# Patient Record
Sex: Female | Born: 1955
Health system: Southern US, Community
[De-identification: ages and names within clinical notes are randomized; demographics above are authoritative.]

## PROBLEM LIST (undated history)

## (undated) DIAGNOSIS — Z86718 Personal history of other venous thrombosis and embolism: Secondary | ICD-10-CM

## (undated) DIAGNOSIS — J189 Pneumonia, unspecified organism: Secondary | ICD-10-CM

## (undated) DIAGNOSIS — E785 Hyperlipidemia, unspecified: Secondary | ICD-10-CM

## (undated) DIAGNOSIS — S92909A Unspecified fracture of unspecified foot, initial encounter for closed fracture: Secondary | ICD-10-CM

## (undated) DIAGNOSIS — J310 Chronic rhinitis: Secondary | ICD-10-CM

## (undated) DIAGNOSIS — I10 Essential (primary) hypertension: Secondary | ICD-10-CM

## (undated) DIAGNOSIS — G5603 Carpal tunnel syndrome, bilateral upper limbs: Secondary | ICD-10-CM

## (undated) HISTORY — DX: Personal history of other venous thrombosis and embolism: Z86.718

## (undated) HISTORY — DX: Hyperlipidemia, unspecified: E78.5

## (undated) HISTORY — DX: Chronic rhinitis: J31.0

## (undated) HISTORY — DX: Pneumonia, unspecified organism: J18.9

## (undated) HISTORY — DX: Carpal tunnel syndrome, bilateral upper limbs: G56.03

## (undated) HISTORY — DX: Unspecified fracture of unspecified foot, initial encounter for closed fracture: S92.909A

---
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
## 1989-12-17 HISTORY — PX: TOTAL ABDOMINAL HYSTERECTOMY: SHX209

## 2004-01-28 ENCOUNTER — Encounter: Admission: RE | Admit: 2004-01-28 | Discharge: 2004-01-28 | Payer: Self-pay | Admitting: Neurosurgery

## 2005-07-03 ENCOUNTER — Ambulatory Visit: Payer: Self-pay

## 2008-06-04 ENCOUNTER — Ambulatory Visit: Payer: Self-pay | Admitting: Family Medicine

## 2008-11-17 ENCOUNTER — Encounter: Admission: RE | Admit: 2008-11-17 | Discharge: 2008-11-17 | Payer: Self-pay | Admitting: Neurosurgery

## 2008-12-17 HISTORY — PX: BACK SURGERY: SHX140

## 2009-02-22 ENCOUNTER — Inpatient Hospital Stay (HOSPITAL_COMMUNITY): Admission: RE | Admit: 2009-02-22 | Discharge: 2009-02-27 | Payer: Self-pay | Admitting: Neurosurgery

## 2010-04-21 LAB — HM DIABETES EYE EXAM: HM Diabetic Eye Exam: NORMAL

## 2010-04-21 LAB — HM PAP SMEAR: HM Pap smear: NORMAL

## 2010-07-12 ENCOUNTER — Ambulatory Visit: Payer: Self-pay | Admitting: Internal Medicine

## 2011-01-07 ENCOUNTER — Encounter: Payer: Self-pay | Admitting: Neurosurgery

## 2011-03-13 ENCOUNTER — Ambulatory Visit: Payer: Self-pay | Admitting: Internal Medicine

## 2011-03-13 ENCOUNTER — Other Ambulatory Visit: Payer: Self-pay | Admitting: Neurosurgery

## 2011-03-13 DIAGNOSIS — M549 Dorsalgia, unspecified: Secondary | ICD-10-CM

## 2011-03-21 ENCOUNTER — Ambulatory Visit
Admission: RE | Admit: 2011-03-21 | Discharge: 2011-03-21 | Disposition: A | Discharge status: Home / Self Care | Payer: 59 | Source: Ambulatory Visit | Attending: Neurosurgery | Admitting: Neurosurgery

## 2011-03-21 DIAGNOSIS — M549 Dorsalgia, unspecified: Secondary | ICD-10-CM

## 2011-03-29 LAB — URINE CULTURE
Culture: NO GROWTH
Special Requests: NEGATIVE

## 2011-03-29 LAB — GLUCOSE, CAPILLARY
Glucose-Capillary: 114 mg/dL — ABNORMAL HIGH (ref 70–99)
Glucose-Capillary: 129 mg/dL — ABNORMAL HIGH (ref 70–99)
Glucose-Capillary: 133 mg/dL — ABNORMAL HIGH (ref 70–99)
Glucose-Capillary: 136 mg/dL — ABNORMAL HIGH (ref 70–99)
Glucose-Capillary: 137 mg/dL — ABNORMAL HIGH (ref 70–99)
Glucose-Capillary: 138 mg/dL — ABNORMAL HIGH (ref 70–99)
Glucose-Capillary: 139 mg/dL — ABNORMAL HIGH (ref 70–99)
Glucose-Capillary: 141 mg/dL — ABNORMAL HIGH (ref 70–99)
Glucose-Capillary: 145 mg/dL — ABNORMAL HIGH (ref 70–99)

## 2011-03-29 LAB — URINALYSIS, ROUTINE W REFLEX MICROSCOPIC
Bilirubin Urine: NEGATIVE
Glucose, UA: NEGATIVE mg/dL
Hgb urine dipstick: NEGATIVE
Ketones, ur: NEGATIVE mg/dL
Nitrite: NEGATIVE
Protein, ur: NEGATIVE mg/dL
Specific Gravity, Urine: 1.012 (ref 1.005–1.030)
Urobilinogen, UA: 0.2 mg/dL (ref 0.0–1.0)

## 2011-03-29 LAB — CBC
HCT: 31.4 % — ABNORMAL LOW (ref 36.0–46.0)
Hemoglobin: 13.8 g/dL (ref 12.0–15.0)
MCHC: 34.7 g/dL (ref 30.0–36.0)
Platelets: 202 10*3/uL (ref 150–400)
RDW: 13.9 % (ref 11.5–15.5)
RDW: 14 % (ref 11.5–15.5)

## 2011-03-29 LAB — TYPE AND SCREEN: Antibody Screen: NEGATIVE

## 2011-03-29 LAB — BASIC METABOLIC PANEL
CO2: 26 mEq/L (ref 19–32)
Calcium: 9.5 mg/dL (ref 8.4–10.5)
Glucose, Bld: 91 mg/dL (ref 70–99)
Sodium: 139 mEq/L (ref 135–145)

## 2011-05-01 NOTE — Op Note (Signed)
NAMEALARA, Olivia Benton            ACCOUNT NO.:  [id number]   MEDICAL RECORD NO.:  [id number]          PATIENT TYPE:  INP   LOCATION:  3004                         FACILITY:  [facility]   PHYSICIAN:  Payton Doughty, M.D.      DATE OF BIRTH:  1956-10-22   DATE OF PROCEDURE:  02/22/2009  DATE OF DISCHARGE:                               OPERATIVE REPORT   PREOPERATIVE DIAGNOSES:  Spondylolysis of L5 a grade 1 slip of L5 on S1  and spondylosis of L4-5.   POSTOPERATIVE DIAGNOSES:  Spondylolysis of L5 a grade 1 slip of L5 on S1  and spondylosis of L4-5.   OPERATIVE PROCEDURE:  L4-5 and L5-S1 laminectomy, diskectomy, posterior  lumbar interbody fusion, segmental pedicle screw fixation from L4-S1 and  posterolateral arthrodesis at L4-S1.   DICTATING DOCTOR:  Payton Doughty, MD   ANESTHESIA:  General endotracheal.   PREPARATION:  Prepped and draped with alcohol wipe.   COMPLICATIONS:  None.   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  She was  taken to the operative room, smoothly anesthetized and intubated, placed  prone on the operating table.  Following shave, prep, and drape in the  usual sterile fashion , the skin was incised from the top of L4 to the  bottom of S1.  The translaminar and transverse process of L4-L5 and  sacral ala were exposed bilaterally in subperiosteal plane.  Working  around the bottom and the [employer] rod fusion, the pars reticularis  lamina and inferior facet of L4-L5 on the left side and the lamina and  inferior facet of L5 on the right and the pars reticularis lamina and  inferior facet of L5 on the right were removed.  Diskectomy was carried  out.  Decompression of the L4, L5, and S1 roots was carried out to  traverse this area.  The endplates were prepared and PEEK cages were  placed 8 mm high  at both L4-5 and L5-S1.  They had been packed with bone  graft, harvested from the facet joints.  Pedicle screws were then placed  in L4, L5, and S1 using the standard landmarks under x-ray control.  One  screw on L5 had to be repositioned and it was done so without  difficulty.  They were connected by the rods.  The transverse process  and sacral ala were decorticated with high-speed drill and packed with  BMP on the extender matrix.  Final tightener was applied to the screws  to the screw caps.  Final x-ray showed good placement of interbody  grafts, pedicle screws,  and rods.  Wound was irrigated.  Hemostasis was assured.  Successive  layers of 0 Vicryl, 2-0 Vicryl, and 3-0 nylon were used to close.  Betadine and Telfa dressing was applied and made occlusive with OpSite  and the patient returned to the recovery room in good condition.  ______________________________  Payton Doughty, M.D.     MWR/MEDQ  D:  02/22/2009  T:  02/23/2009  Job:  [id number]

## 2011-05-01 NOTE — Discharge Summary (Signed)
Olivia Benton, Olivia Benton            ACCOUNT NO.:  [id number]   MEDICAL RECORD NO.:  [id number]          PATIENT TYPE:  INP   LOCATION:  3004                         FACILITY:  [facility]   PHYSICIAN:  Hilda Lias, M.D.   DATE OF BIRTH:  09-01-1956   DATE OF ADMISSION:  02/22/2009  DATE OF DISCHARGE:  02/27/2009                               DISCHARGE SUMMARY   ADMISSION DIAGNOSIS:  Lumbar spondylosis at L4-5 with spondylolisthesis  at L5-S1.   FINAL DIAGNOSIS:  Lumbar spondylosis at L4-5 with spondylolisthesis at  L5-S1.   CLINICAL HISTORY:  The patient was admitted because of back pain  radiating to both legs.  The patient previously had fusion from L4-T1.  Now she comes with changes at L4-5 and L5-S1.   LABORATORY:  Normal.   COURSE IN THE HOSPITAL:  The patient was taken to surgery and L4-5 and  L5-S1 fusion was done.  Today, she is walking.  She is ambulating.  She  has minimal discomfort.  She is being discharged to be followed by Dr.  Channing Mutters.   CONDITION ON DISCHARGE:  Improvement.   MEDICATIONS:  Percocet and diazepam.   DIET:  Regular.   ACTIVITY:  Not to drive and not to bend.   FOLLOW UP:  To be seen by Dr. Channing Mutters in 4 weeks.           ______________________________  Hilda Lias, M.D.     EB/MEDQ  D:  02/27/2009  T:  02/28/2009  Job:  [id number]

## 2011-05-01 NOTE — H&P (Signed)
Olivia Benton, Olivia Benton            ACCOUNT NO.:  [id number]   MEDICAL RECORD NO.:  [id number]          PATIENT TYPE:  INP   LOCATION:  3004                         FACILITY:  [facility]   PHYSICIAN:  Payton Doughty, M.D.      DATE OF BIRTH:  07/16/1956   DATE OF ADMISSION:  02/22/2009  DATE OF DISCHARGE:                              HISTORY & PHYSICAL   ADMISSION DIAGNOSES:  Spondylosis L4-5, spondylolysis of L5-S1, and  spondylolisthesis of L5 and S67.   A 55 year old right-handed white girl been followed for number of years.  She had a scoliosis operation at 55 years of age and was fused from L4-  T1, been doing well, but has had progressive increasing pain in her back  and into both legs.  CT demonstrates a severe spondylosis at L4-5 and  spondylolysis of L5 with grade 1 slip at 5 and 1 and she is now finally  admitted for a lumbar fusion at those levels.   MEDICAL HISTORY:  Remarkable for adult-onset diabetes and hypertension.   OPERATIONS:  As noted above.   SOCIAL HISTORY:  She smokes a pack of cigarettes a day and is not  working.   FAMILY HISTORY:  Not given.   REVIEW OF SYSTEMS:  Marked for back and leg pain.   PHYSICAL EXAMINATION:  HEENT:  Normal limits.  NECK:  She has good range of motion in her neck.  CHEST:  Clear.  CARDIAC:  Regular rate and rhythm.  NEUROLOGIC:  She is awake, alert, and oriented.  Cranial nerves are  intact.  Motor exam shows 5/5 strength throughout the upper extremities.  LOWER EXTREMITIES:  She has bilateral dorsiflexion weakness.  Sensory  dysesthesias described in L4 and L5 distribution.  Reflexes are absent  at the knees and ankles.   CT results have been reviewed above.   CLINICAL IMPRESSION:  Symptomatic spondylolysis and adjacent segment  disease at 4 and 5.  Spondylolisthesis at L5.   PLAN:  Lumbar laminectomy, diskectomy, posterior lumbar and body fusion.  We will try from one side and see what the decompression looks like.  She will  need pedicle fixation.  The risks and benefits approach have  been discussed with her.  She wished to proceed.   .           ______________________________  Payton Doughty, M.D.     MWR/MEDQ  D:  02/22/2009  T:  02/23/2009  Job:  [id number]

## 2011-07-18 ENCOUNTER — Ambulatory Visit: Payer: Self-pay | Admitting: Internal Medicine

## 2011-07-20 ENCOUNTER — Ambulatory Visit: Payer: Self-pay | Admitting: Internal Medicine

## 2011-07-20 LAB — HM MAMMOGRAPHY

## 2011-07-26 ENCOUNTER — Encounter: Payer: Self-pay | Admitting: Internal Medicine

## 2011-09-17 ENCOUNTER — Ambulatory Visit: Payer: Self-pay | Admitting: Surgery

## 2011-09-17 HISTORY — PX: BREAST BIOPSY: SHX20

## 2011-09-18 LAB — PATHOLOGY REPORT

## 2011-09-22 ENCOUNTER — Other Ambulatory Visit: Payer: Self-pay | Admitting: Internal Medicine

## 2011-10-18 ENCOUNTER — Ambulatory Visit (INDEPENDENT_AMBULATORY_CARE_PROVIDER_SITE_OTHER): Payer: 59 | Admitting: Internal Medicine

## 2011-10-18 ENCOUNTER — Encounter: Payer: Self-pay | Admitting: Internal Medicine

## 2011-10-18 DIAGNOSIS — E119 Type 2 diabetes mellitus without complications: Secondary | ICD-10-CM | POA: Insufficient documentation

## 2011-10-18 MED ORDER — METFORMIN HCL 500 MG PO TABS: 500.0000 mg | ORAL_TABLET | Freq: Every day | ORAL | Status: DC

## 2011-10-18 NOTE — Progress Notes (Signed)
Subjective:    Patient ID: Olivia Benton, female    DOB: Oct 17, 1956, 55 y.o.   MRN: [id number]  HPI 55 year old female with a history of diabetes presents for followup. She reports that she's been feeling well. She reports that blood sugars fasting are typically near 100. She denies any low blood sugars. She denies any blood sugars greater than 200. She continues trying to improve diet by limiting intake of high carbohydrate and high fat foods. She has not had a recent hemoglobin A1c. She denies any new wounds. She denies any other complaints today.  Outpatient Encounter Prescriptions as of 10/18/2011  Medication Sig Dispense Refill  . aspirin 81 MG tablet Take 81 mg by mouth daily.        Marland Kitchen doxycycline (VIBRAMYCIN) 100 MG capsule Take 100 mg by mouth daily.        . metFORMIN (GLUCOPHAGE) 500 MG tablet Take 1 tablet (500 mg total) by mouth daily.  90 tablet  4  . DISCONTD: metFORMIN (GLUCOPHAGE) 500 MG tablet Take 500 mg by mouth daily with breakfast.        . fluticasone (FLONASE) 50 MCG/ACT nasal spray Place 2 sprays into both nostrils Daily.      Marland Kitchen FREESTYLE LITE test strip 1 applicator Daily.      Marland Kitchen HYDROcodone-acetaminophen (VICODIN) 5-500 MG per tablet Take 1 tablet by mouth as needed.      Marland Kitchen losartan-hydrochlorothiazide (HYZAAR) 100-12.5 MG per tablet Take 1 tablet by mouth Daily.      Marland Kitchen omeprazole (PRILOSEC) 40 MG capsule Take 1 tablet by mouth Daily.      Marland Kitchen SINGULAIR 10 MG tablet TAKE 1 TABLET BY MOUTH AT BEDTIME  90 tablet  3    Review of Systems  Constitutional: Negative for fever, chills, appetite change, fatigue and unexpected weight change.  HENT: Negative for ear pain, congestion, sore throat, trouble swallowing, neck pain, voice change and sinus pressure.   Eyes: Negative for visual disturbance.  Respiratory: Negative for cough, shortness of breath, wheezing and stridor.   Cardiovascular: Negative for chest pain, palpitations and leg swelling.  Gastrointestinal:  Negative for nausea, vomiting, abdominal pain, diarrhea, constipation, blood in stool, abdominal distention and anal bleeding.  Genitourinary: Negative for dysuria and flank pain.  Musculoskeletal: Negative for myalgias, arthralgias and gait problem.  Skin: Negative for color change and rash.  Neurological: Negative for dizziness and headaches.  Hematological: Negative for adenopathy. Does not bruise/bleed easily.  Psychiatric/Behavioral: Negative for suicidal ideas, sleep disturbance and dysphoric mood. The patient is not nervous/anxious.    BP 113/75  Pulse 93  Temp(Src) 98.7 F (37.1 C) (Oral)  Resp 16  Ht 4\' 10"  (1.473 m)  Wt 159 lb (72.122 kg)  BMI 33.23 kg/m2  SpO2 97%     Objective:   Physical Exam  Constitutional: She is oriented to person, place, and time. She appears well-developed and well-nourished. No distress.  HENT:  Head: Normocephalic and atraumatic.  Right Ear: External ear normal.  Left Ear: External ear normal.  Nose: Nose normal.  Mouth/Throat: Oropharynx is clear and moist. No oropharyngeal exudate.  Eyes: Conjunctivae are normal. Pupils are equal, round, and reactive to light. Right eye exhibits no discharge. Left eye exhibits no discharge. No scleral icterus.  Neck: Normal range of motion. Neck supple. No tracheal deviation present. No thyromegaly present.  Cardiovascular: Normal rate, regular rhythm, normal heart sounds and intact distal pulses.  Exam reveals no gallop and no friction rub.   No murmur  heard. Pulmonary/Chest: Effort normal and breath sounds normal. No respiratory distress. She has no wheezes. She has no rales. She exhibits no tenderness.  Abdominal: Soft. Bowel sounds are normal. She exhibits no distension and no mass. There is no tenderness. There is no rebound and no guarding.  Musculoskeletal: Normal range of motion. She exhibits no edema and no tenderness.  Lymphadenopathy:    She has no cervical adenopathy.  Neurological: She is alert  and oriented to person, place, and time. No cranial nerve deficit. She exhibits normal muscle tone. Coordination normal.  Skin: Skin is warm and dry. No rash noted. She is not diaphoretic. No erythema. No pallor.  Psychiatric: She has a normal mood and affect. Her behavior is normal. Judgment and thought content normal.          Assessment & Plan:  1. Diabetes Mellitus -patient reports good control of diabetes mellitus. Will check hemoglobin A1c with labs. We'll plan to continue metformin. She will followup in 3 months.  2. Breast biopsy - patient had a recent breast biopsy after an abnormal mammogram. She notes that the biopsy was normal. We will request records on this.

## 2011-10-26 ENCOUNTER — Telehealth: Payer: Self-pay | Admitting: Internal Medicine

## 2011-10-26 NOTE — Telephone Encounter (Signed)
Labs faxed from [facility] were normal including blood counts, kidney function, liver function, cholesterol. A1c was. 5.9

## 2011-10-26 NOTE — Telephone Encounter (Signed)
Left detailed VM on pt's cell

## 2011-11-07 ENCOUNTER — Encounter: Payer: Self-pay | Admitting: Internal Medicine

## 2011-11-23 ENCOUNTER — Encounter: Payer: Self-pay | Admitting: Internal Medicine

## 2011-12-19 ENCOUNTER — Ambulatory Visit (INDEPENDENT_AMBULATORY_CARE_PROVIDER_SITE_OTHER): Payer: 59 | Admitting: Internal Medicine

## 2011-12-19 ENCOUNTER — Encounter: Payer: Self-pay | Admitting: Internal Medicine

## 2011-12-19 VITALS — BP 104/72 | HR 72 | Temp 98.6°F | Wt 154.0 lb

## 2011-12-19 DIAGNOSIS — J45901 Unspecified asthma with (acute) exacerbation: Secondary | ICD-10-CM

## 2011-12-19 MED ORDER — FLUTICASONE PROPIONATE HFA 220 MCG/ACT IN AERO: 1.0000 | INHALATION_SPRAY | Freq: Two times a day (BID) | RESPIRATORY_TRACT | Status: DC

## 2011-12-19 MED ORDER — HYDROCODONE-ACETAMINOPHEN 5-500 MG PO TABS: 1.0000 | ORAL_TABLET | Freq: Four times a day (QID) | ORAL | Status: DC | PRN

## 2011-12-19 MED ORDER — AMOXICILLIN-POT CLAVULANATE 875-125 MG PO TABS: 1.0000 | ORAL_TABLET | Freq: Two times a day (BID) | ORAL | Status: AC

## 2011-12-19 MED ORDER — ALBUTEROL SULFATE HFA 108 (90 BASE) MCG/ACT IN AERS: 2.0000 | INHALATION_SPRAY | Freq: Four times a day (QID) | RESPIRATORY_TRACT | Status: DC | PRN

## 2011-12-19 NOTE — Patient Instructions (Signed)
I am prescribing augmentin (antibiotic twice daily) and a steroid inhaler (use twice daily)  and a bronchodilator(albuuterol 1 pufff every 6 hours as needed ) to manage your wheezing without resorting to prednisone.   continue the mucinex with plenty of water,  Gargle with salt water for the sore throat AFTER you use the steroid  Inhaler twice daily ( to prevent thrush and manage your sore throat)

## 2011-12-19 NOTE — Progress Notes (Signed)
Subjective:    Patient ID: Olivia Benton, female    DOB: October 09, 1956, 56 y.o.   MRN: [id number]  HPI s. Zanetti is a 56 yr old white female with a history of asthma, no recent exacerbations, who presents with sore throat,  cough, sinus drainage, and wheezing.  She denies fevers.  Symptomsotms have been present for 4 days.  She has been using mucinex d and zyrtec d  with minimal improvement in symptoms.  Cough is worse at night and is disrupting  her sleep.   She also reports chest tightness. .  No myalgias, did have the influenza vaccine.   Past Medical History  Diagnosis Date  . Asthma     extrinsic  . Diabetes mellitus     non-insulin dependent  . Foot fracture   . Rhinitis   . Hyperlipidemia    Current Outpatient Prescriptions on File Prior to Visit  Medication Sig Dispense Refill  . aspirin 81 MG tablet Take 81 mg by mouth daily.        Marland Kitchen doxycycline (VIBRAMYCIN) 100 MG capsule Take 100 mg by mouth daily.        . fluticasone (FLONASE) 50 MCG/ACT nasal spray Place 2 sprays into both nostrils Daily.      Marland Kitchen FREESTYLE LITE test strip 1 applicator Daily.      Marland Kitchen losartan-hydrochlorothiazide (HYZAAR) 100-12.5 MG per tablet Take 1 tablet by mouth Daily.      . metFORMIN (GLUCOPHAGE) 500 MG tablet Take 1 tablet (500 mg total) by mouth daily.  90 tablet  4  . omeprazole (PRILOSEC) 40 MG capsule Take 1 tablet by mouth Daily.      Marland Kitchen SINGULAIR 10 MG tablet TAKE 1 TABLET BY MOUTH AT BEDTIME  90 tablet  3    Review of Systems  Constitutional: Negative for fever, chills and unexpected weight change.  HENT: Positive for congestion, sore throat, rhinorrhea, sneezing and postnasal drip. Negative for hearing loss, ear pain, nosebleeds, facial swelling, mouth sores, trouble swallowing, neck pain, neck stiffness, voice change, sinus pressure, tinnitus and ear discharge.   Eyes: Negative for pain, discharge, redness and visual disturbance.  Respiratory: Positive for cough, shortness of breath and  wheezing. Negative for chest tightness and stridor.   Cardiovascular: Negative for chest pain, palpitations and leg swelling.  Musculoskeletal: Negative for myalgias and arthralgias.  Skin: Negative for color change and rash.  Neurological: Negative for dizziness, weakness, light-headedness and headaches.  Hematological: Negative for adenopathy.       Objective:   Physical Exam  Constitutional: She is oriented to person, place, and time. She appears well-developed and well-nourished.  HENT:  Mouth/Throat: Oropharynx is clear and moist.  Eyes: EOM are normal. Pupils are equal, round, and reactive to light. No scleral icterus.  Neck: Normal range of motion. Neck supple. No JVD present. No thyromegaly present.  Cardiovascular: Normal rate, regular rhythm, normal heart sounds and intact distal pulses.   Pulmonary/Chest: Effort normal. She has wheezes.  Abdominal: Soft. Bowel sounds are normal. She exhibits no mass. There is no tenderness.  Musculoskeletal: Normal range of motion. She exhibits no edema.  Lymphadenopathy:    She has no cervical adenopathy.  Neurological: She is alert and oriented to person, place, and time.  Skin: Skin is warm and dry.  Psychiatric: She has a normal mood and affect.          Assessment & Plan:  Asthma exacerbation: likely secondary to viral URI but given underlying lung diease and  DM, will treat with augmentin, steroid inhaler and bronchodilator.  40 mg Depomedrol given;  She has defferd steroid taper.

## 2011-12-21 MED ORDER — METHYLPREDNISOLONE ACETATE 40 MG/ML IJ SUSP
40.0000 mg | Freq: Once | INTRAMUSCULAR | Status: AC
  Administered 2011-12-21: 40 mg via INTRAMUSCULAR

## 2011-12-21 NOTE — Progress Notes (Signed)
Addended by: Jobie Quaker on: 12/21/2011 06:10 PM   Modules accepted: Orders

## 2012-01-10 ENCOUNTER — Other Ambulatory Visit: Payer: Self-pay | Admitting: Internal Medicine

## 2012-01-10 MED ORDER — LOSARTAN POTASSIUM-HCTZ 100-12.5 MG PO TABS: 1.0000 | ORAL_TABLET | Freq: Every day | ORAL | Status: DC

## 2012-01-18 ENCOUNTER — Ambulatory Visit: Payer: 59 | Admitting: Internal Medicine

## 2012-01-21 ENCOUNTER — Encounter: Payer: Self-pay | Admitting: Internal Medicine

## 2012-01-21 ENCOUNTER — Ambulatory Visit (INDEPENDENT_AMBULATORY_CARE_PROVIDER_SITE_OTHER): Payer: 59 | Admitting: Internal Medicine

## 2012-01-21 VITALS — BP 112/72 | HR 118 | Temp 98.3°F | Ht <= 58 in | Wt 151.0 lb

## 2012-01-21 DIAGNOSIS — I10 Essential (primary) hypertension: Secondary | ICD-10-CM

## 2012-01-21 DIAGNOSIS — K219 Gastro-esophageal reflux disease without esophagitis: Secondary | ICD-10-CM | POA: Insufficient documentation

## 2012-01-21 DIAGNOSIS — E119 Type 2 diabetes mellitus without complications: Secondary | ICD-10-CM

## 2012-01-21 DIAGNOSIS — J309 Allergic rhinitis, unspecified: Secondary | ICD-10-CM | POA: Insufficient documentation

## 2012-01-21 DIAGNOSIS — E1169 Type 2 diabetes mellitus with other specified complication: Secondary | ICD-10-CM | POA: Insufficient documentation

## 2012-01-21 MED ORDER — MONTELUKAST SODIUM 10 MG PO TABS: 10.0000 mg | ORAL_TABLET | Freq: Every day | ORAL | Status: DC

## 2012-01-21 MED ORDER — GLUCOSE BLOOD VI STRP: ORAL_STRIP | Status: DC

## 2012-01-21 MED ORDER — FLUTICASONE PROPIONATE 50 MCG/ACT NA SUSP: 2.0000 | Freq: Every day | NASAL | Status: DC

## 2012-01-21 MED ORDER — LOSARTAN POTASSIUM-HCTZ 100-12.5 MG PO TABS: 1.0000 | ORAL_TABLET | Freq: Every day | ORAL | Status: DC

## 2012-01-21 MED ORDER — FREESTYLE LANCETS MISC: Status: AC

## 2012-01-21 MED ORDER — OMEPRAZOLE 40 MG PO CPDR: 40.0000 mg | DELAYED_RELEASE_CAPSULE | Freq: Every day | ORAL | Status: DC

## 2012-01-21 NOTE — Assessment & Plan Note (Signed)
Is well controlled with omeprazole. Will continue. Followup in 3 months.

## 2012-01-21 NOTE — Progress Notes (Signed)
Subjective:    Patient ID: Olivia Benton, female    DOB: 18-Mar-1956, 56 y.o.   MRN: [id number]  HPI 56 year old female with history of diabetes, hypertension, GERD, allergic rhinitis presents for followup. She reports she is doing very well. She continues to follow healthy diet and has lost nearly 8 pounds since November 2012. She reports her blood sugars are well controlled and typically fasting sugars are near 100. She reports full compliance with her metformin. She denies any noted side effects. In regards to her hypertension, she notes blood pressure is been well controlled. She denies any headache, chest pain, palpitations. In regards to her GERD, she notes that symptoms are well controlled with the use of omeprazole. In regards to her allergic rhinitis, she notes the symptoms are well-controlled Flonase.  Outpatient Encounter Prescriptions as of 01/21/2012  Medication Sig Dispense Refill  . albuterol (PROAIR HFA) 108 (90 BASE) MCG/ACT inhaler Inhale 2 puffs into the lungs every 6 (six) hours as needed for wheezing.  3.7 g  6  . aspirin 81 MG tablet Take 81 mg by mouth daily.        Marland Kitchen doxycycline (VIBRAMYCIN) 100 MG capsule Take 100 mg by mouth daily.        . fluticasone (FLONASE) 50 MCG/ACT nasal spray Place 2 sprays into the nose daily.  16 g  3  . glucose blood (FREESTYLE LITE) test strip Use as directed  300 each  3  . HYDROcodone-acetaminophen (VICODIN) 5-500 MG per tablet Take 1 tablet by mouth every 6 (six) hours as needed (cough).  90 tablet  0  . losartan-hydrochlorothiazide (HYZAAR) 100-12.5 MG per tablet Take 1 tablet by mouth daily.  90 tablet  3  . metFORMIN (GLUCOPHAGE) 500 MG tablet Take 1 tablet (500 mg total) by mouth daily.  90 tablet  4  . montelukast (SINGULAIR) 10 MG tablet Take 1 tablet (10 mg total) by mouth at bedtime.  90 tablet  3  . omeprazole (PRILOSEC) 40 MG capsule Take 1 capsule (40 mg total) by mouth daily.  90 capsule  3  . DISCONTD: fluticasone (FLONASE)  50 MCG/ACT nasal spray Place 2 sprays into both nostrils Daily.      Marland Kitchen DISCONTD: FREESTYLE LITE test strip 1 applicator Daily.      Marland Kitchen DISCONTD: losartan-hydrochlorothiazide (HYZAAR) 100-12.5 MG per tablet Take 1 tablet by mouth daily.  90 tablet  3  . DISCONTD: omeprazole (PRILOSEC) 40 MG capsule Take 1 tablet by mouth Daily.      Marland Kitchen DISCONTD: SINGULAIR 10 MG tablet TAKE 1 TABLET BY MOUTH AT BEDTIME  90 tablet  3  . fluticasone (FLOVENT HFA) 220 MCG/ACT inhaler Inhale 1 puff into the lungs 2 (two) times daily.  1 Inhaler  12  . Lancets (FREESTYLE) lancets Use as instructed  300 each  3    Review of Systems  Constitutional: Negative for fever, chills, appetite change, fatigue and unexpected weight change.  HENT: Negative for ear pain, congestion, sore throat, trouble swallowing, neck pain, voice change and sinus pressure.   Eyes: Negative for visual disturbance.  Respiratory: Negative for cough, shortness of breath, wheezing and stridor.   Cardiovascular: Negative for chest pain, palpitations and leg swelling.  Gastrointestinal: Negative for nausea, vomiting, abdominal pain, diarrhea, constipation, blood in stool, abdominal distention and anal bleeding.  Genitourinary: Negative for dysuria and flank pain.  Musculoskeletal: Negative for myalgias, arthralgias and gait problem.  Skin: Negative for color change and rash.  Neurological: Negative for dizziness  and headaches.  Hematological: Negative for adenopathy. Does not bruise/bleed easily.  Psychiatric/Behavioral: Negative for suicidal ideas, sleep disturbance and dysphoric mood. The patient is not nervous/anxious.    BP 112/72  Pulse 118  Temp(Src) 98.3 F (36.8 C) (Oral)  Ht 4\' 10"  (1.473 m)  Wt 151 lb (68.493 kg)  BMI 31.56 kg/m2  SpO2 98%     Objective:   Physical Exam  Constitutional: She is oriented to person, place, and time. She appears well-developed and well-nourished. No distress.  HENT:  Head: Normocephalic and  atraumatic.  Right Ear: External ear normal.  Left Ear: External ear normal.  Nose: Nose normal.  Mouth/Throat: Oropharynx is clear and moist. No oropharyngeal exudate.  Eyes: Conjunctivae are normal. Pupils are equal, round, and reactive to light. Right eye exhibits no discharge. Left eye exhibits no discharge. No scleral icterus.  Neck: Normal range of motion. Neck supple. No tracheal deviation present. No thyromegaly present.  Cardiovascular: Normal rate, regular rhythm, normal heart sounds and intact distal pulses.  Exam reveals no gallop and no friction rub.   No murmur heard. Pulmonary/Chest: Effort normal and breath sounds normal. No respiratory distress. She has no wheezes. She has no rales. She exhibits no tenderness.  Musculoskeletal: Normal range of motion. She exhibits no edema and no tenderness.  Lymphadenopathy:    She has no cervical adenopathy.  Neurological: She is alert and oriented to person, place, and time. No cranial nerve deficit. She exhibits normal muscle tone. Coordination normal.  Skin: Skin is warm and dry. No rash noted. She is not diaphoretic. No erythema. No pallor.  Psychiatric: She has a normal mood and affect. Her behavior is normal. Judgment and thought content normal.          Assessment & Plan:

## 2012-01-21 NOTE — Assessment & Plan Note (Signed)
Symptoms well controlled with Flonase. Will continue. 

## 2012-01-21 NOTE — Assessment & Plan Note (Signed)
Blood sugars currently well-controlled per patient. Will check hemoglobin A1c with labs. Continue metformin. Followup in 3 months.

## 2012-01-21 NOTE — Assessment & Plan Note (Signed)
Blood pressure well-controlled on losartan. We'll check renal function with labs. Followup in 3 months.

## 2012-01-22 ENCOUNTER — Encounter: Payer: Self-pay | Admitting: Internal Medicine

## 2012-02-07 ENCOUNTER — Ambulatory Visit: Payer: Self-pay | Admitting: Surgery

## 2012-02-11 ENCOUNTER — Telehealth: Payer: Self-pay | Admitting: Internal Medicine

## 2012-02-11 NOTE — Telephone Encounter (Signed)
Labs look good. A1c was 6%. Cholesterol was stable. LDL 93, just above goal of <70.  Triglycerides slightly high at 228.

## 2012-02-12 NOTE — Telephone Encounter (Signed)
Left mess to call office back.   

## 2012-02-14 ENCOUNTER — Encounter: Payer: Self-pay | Admitting: Internal Medicine

## 2012-02-14 NOTE — Telephone Encounter (Signed)
Patient notified

## 2012-04-21 ENCOUNTER — Encounter: Payer: Self-pay | Admitting: Internal Medicine

## 2012-04-21 ENCOUNTER — Ambulatory Visit (INDEPENDENT_AMBULATORY_CARE_PROVIDER_SITE_OTHER): Payer: 59 | Admitting: Internal Medicine

## 2012-04-21 VITALS — BP 113/75 | HR 92 | Temp 98.1°F | Resp 16 | Wt 155.5 lb

## 2012-04-21 DIAGNOSIS — E119 Type 2 diabetes mellitus without complications: Secondary | ICD-10-CM

## 2012-04-21 DIAGNOSIS — G8929 Other chronic pain: Secondary | ICD-10-CM

## 2012-04-21 DIAGNOSIS — I10 Essential (primary) hypertension: Secondary | ICD-10-CM

## 2012-04-21 DIAGNOSIS — M545 Low back pain, unspecified: Secondary | ICD-10-CM

## 2012-04-21 LAB — HM COLONOSCOPY

## 2012-04-21 NOTE — Progress Notes (Signed)
Subjective:    Patient ID: Olivia Benton, female    DOB: 1955/12/24, 56 y.o.   MRN: [id number]  HPI 56YO female with history of diabetes, hypertension, and chronic low back pain presents for followup. She reports she is generally doing well. In regards to her diabetes, she reports her blood sugars have been well-controlled. She did not bring a record of her blood sugars today. She reports full compliance with her metformin. In regards to her hypertension, she reports full compliance with her medications. She denies any chest pain, palpitations, headache. In regards to her chronic low back pain, she reports her symptoms have been fairly well-controlled without medications until recently in which she had some flareup of her pain. She notes that her orthopedic surgeon typically treats these with Vicodin as needed. She has no new concerns today.  Outpatient Encounter Prescriptions as of 04/21/2012  Medication Sig Dispense Refill  . albuterol (PROAIR HFA) 108 (90 BASE) MCG/ACT inhaler Inhale 2 puffs into the lungs every 6 (six) hours as needed for wheezing.  3.7 g  6  . aspirin 81 MG tablet Take 81 mg by mouth daily.        Marland Kitchen doxycycline (VIBRAMYCIN) 100 MG capsule Take 100 mg by mouth daily.        . fluticasone (FLONASE) 50 MCG/ACT nasal spray Place 2 sprays into the nose daily.  16 g  3  . fluticasone (FLOVENT HFA) 220 MCG/ACT inhaler Inhale 1 puff into the lungs 2 (two) times daily.  1 Inhaler  12  . glucose blood (FREESTYLE LITE) test strip Use as directed  300 each  3  . HYDROcodone-acetaminophen (VICODIN) 5-500 MG per tablet Take 1 tablet by mouth every 6 (six) hours as needed (cough).  90 tablet  0  . Lancets (FREESTYLE) lancets Use as instructed  300 each  3  . losartan-hydrochlorothiazide (HYZAAR) 100-12.5 MG per tablet Take 1 tablet by mouth daily.  90 tablet  3  . metFORMIN (GLUCOPHAGE) 500 MG tablet Take 1 tablet (500 mg total) by mouth daily.  90 tablet  4  . montelukast (SINGULAIR) 10  MG tablet Take 1 tablet (10 mg total) by mouth at bedtime.  90 tablet  3  . Multiple Minerals-Vitamins (CALCIUM & VIT D3 BONE HEALTH PO) Take by mouth daily.      Marland Kitchen omeprazole (PRILOSEC) 40 MG capsule Take 1 capsule (40 mg total) by mouth daily.  90 capsule  3  . vitamin C (ASCORBIC ACID) 500 MG tablet Take 500 mg by mouth daily.       BP 113/75  Pulse 92  Temp(Src) 98.1 F (36.7 C) (Oral)  Resp 16  Wt 155 lb 8 oz (70.534 kg)  SpO2 100%  Review of Systems  Constitutional: Negative for fever, chills, appetite change, fatigue and unexpected weight change.  HENT: Negative for ear pain, congestion, sore throat, trouble swallowing, neck pain, voice change and sinus pressure.   Eyes: Negative for visual disturbance.  Respiratory: Negative for cough, shortness of breath, wheezing and stridor.   Cardiovascular: Negative for chest pain, palpitations and leg swelling.  Gastrointestinal: Negative for nausea, vomiting, abdominal pain, diarrhea, constipation, blood in stool, abdominal distention and anal bleeding.  Genitourinary: Negative for dysuria and flank pain.  Musculoskeletal: Positive for back pain. Negative for myalgias, arthralgias and gait problem.  Skin: Negative for color change and rash.  Neurological: Negative for dizziness and headaches.  Hematological: Negative for adenopathy. Does not bruise/bleed easily.  Psychiatric/Behavioral: Negative for suicidal  ideas, sleep disturbance and dysphoric mood. The patient is not nervous/anxious.        Objective:   Physical Exam  Constitutional: She is oriented to person, place, and time. She appears well-developed and well-nourished. No distress.  HENT:  Head: Normocephalic and atraumatic.  Right Ear: External ear normal.  Left Ear: External ear normal.  Nose: Nose normal.  Mouth/Throat: Oropharynx is clear and moist. No oropharyngeal exudate.  Eyes: Conjunctivae are normal. Pupils are equal, round, and reactive to light. Right eye  exhibits no discharge. Left eye exhibits no discharge. No scleral icterus.  Neck: Normal range of motion. Neck supple. No tracheal deviation present. No thyromegaly present.  Cardiovascular: Normal rate, regular rhythm, normal heart sounds and intact distal pulses.  Exam reveals no gallop and no friction rub.   No murmur heard. Pulmonary/Chest: Effort normal and breath sounds normal. No respiratory distress. She has no wheezes. She has no rales. She exhibits no tenderness.  Musculoskeletal: Normal range of motion. She exhibits no edema and no tenderness.  Lymphadenopathy:    She has no cervical adenopathy.  Neurological: She is alert and oriented to person, place, and time. No cranial nerve deficit. She exhibits normal muscle tone. Coordination normal.  Skin: Skin is warm and dry. No rash noted. She is not diaphoretic. No erythema. No pallor.  Psychiatric: She has a normal mood and affect. Her behavior is normal. Judgment and thought content normal.          Assessment & Plan:

## 2012-04-21 NOTE — Assessment & Plan Note (Signed)
Blood pressure is well-controlled today. We'll continue current medications. Will check renal function with labs today. Followup in 3 months.

## 2012-04-21 NOTE — Assessment & Plan Note (Signed)
Secondary to OA. Symptoms well controlled with vicodin prn. Will continue.

## 2012-04-21 NOTE — Assessment & Plan Note (Signed)
Patient reports blood sugars have been well controlled. Will check A1c with labs. Continue metformin. Followup in 3 months.

## 2012-06-29 LAB — HM PAP SMEAR: HM Pap smear: NORMAL

## 2012-07-16 ENCOUNTER — Ambulatory Visit (INDEPENDENT_AMBULATORY_CARE_PROVIDER_SITE_OTHER): Payer: 59 | Admitting: Internal Medicine

## 2012-07-16 ENCOUNTER — Encounter: Payer: Self-pay | Admitting: Internal Medicine

## 2012-07-16 VITALS — BP 140/80 | HR 75 | Temp 98.4°F | Ht <= 58 in | Wt 157.8 lb

## 2012-07-16 DIAGNOSIS — J309 Allergic rhinitis, unspecified: Secondary | ICD-10-CM

## 2012-07-16 DIAGNOSIS — L309 Dermatitis, unspecified: Secondary | ICD-10-CM | POA: Insufficient documentation

## 2012-07-16 DIAGNOSIS — L259 Unspecified contact dermatitis, unspecified cause: Secondary | ICD-10-CM

## 2012-07-16 MED ORDER — FLUTICASONE PROPIONATE 50 MCG/ACT NA SUSP: 2.0000 | Freq: Every day | NASAL | Status: DC

## 2012-07-16 MED ORDER — TRIAMCINOLONE ACETONIDE 0.5 % EX OINT: TOPICAL_OINTMENT | Freq: Two times a day (BID) | CUTANEOUS | Status: DC

## 2012-07-16 NOTE — Assessment & Plan Note (Signed)
Symptoms and exam are most consistent with solar dermatitis. Rash does not have appearance typical of shingles and crosses midline, making this unlikely. Encouraged her to refrain from further sun exposure at this point. Will try topical triamcinolone cream to help with itching. Patient will call or return to clinic if symptoms are not improving over the next 48 hours.

## 2012-07-16 NOTE — Progress Notes (Signed)
Subjective:    Patient ID: Olivia Benton, female    DOB: 1956-06-29, 56 y.o.   MRN: [id number]  HPI 56 year old female presents for acute visit complaining of one-day history of rash over her anterior chest. She reports that the rash is red and itchy. She has a history of contact dermatitis with metal products but does not recall any exposure to metal or any other new chemicals. She did have a sunburn after exposure to sun last weekend. She denies any fever, chills, pain at the rash site. She was concerned that the rash might represent shingles.  Outpatient Encounter Prescriptions as of 07/16/2012  Medication Sig Dispense Refill  . albuterol (PROAIR HFA) 108 (90 BASE) MCG/ACT inhaler Inhale 2 puffs into the lungs every 6 (six) hours as needed for wheezing.  3.7 g  6  . aspirin 81 MG tablet Take 81 mg by mouth daily.        Marland Kitchen doxycycline (VIBRAMYCIN) 100 MG capsule Take 100 mg by mouth daily.        . fluticasone (FLONASE) 50 MCG/ACT nasal spray Place 2 sprays into the nose daily.  16 g  3  . fluticasone (FLOVENT HFA) 220 MCG/ACT inhaler Inhale 1 puff into the lungs 2 (two) times daily.  1 Inhaler  12  . glucose blood (FREESTYLE LITE) test strip Use as directed  300 each  3  . HYDROcodone-acetaminophen (VICODIN) 5-500 MG per tablet Take 1 tablet by mouth every 6 (six) hours as needed (cough).  90 tablet  0  . Lancets (FREESTYLE) lancets Use as instructed  300 each  3  . losartan-hydrochlorothiazide (HYZAAR) 100-12.5 MG per tablet Take 1 tablet by mouth daily.  90 tablet  3  . metFORMIN (GLUCOPHAGE) 500 MG tablet Take 1 tablet (500 mg total) by mouth daily.  90 tablet  4  . montelukast (SINGULAIR) 10 MG tablet Take 1 tablet (10 mg total) by mouth at bedtime.  90 tablet  3  . Multiple Minerals-Vitamins (CALCIUM & VIT D3 BONE HEALTH PO) Take by mouth daily.      Marland Kitchen omeprazole (PRILOSEC) 40 MG capsule Take 1 capsule (40 mg total) by mouth daily.  90 capsule  3  . vitamin C (ASCORBIC ACID) 500 MG  tablet Take 500 mg by mouth daily.      Marland Kitchen DISCONTD: fluticasone (FLONASE) 50 MCG/ACT nasal spray Place 2 sprays into the nose daily.  16 g  3  . triamcinolone ointment (KENALOG) 0.5 % Apply topically 2 (two) times daily.  30 g  0   BP 140/80  Pulse 75  Temp 98.4 F (36.9 C) (Oral)  Ht 4\' 10"  (1.473 m)  Wt 157 lb 12 oz (71.555 kg)  BMI 32.97 kg/m2  SpO2 98%  Review of Systems  Constitutional: Negative for fever, chills, appetite change, fatigue and unexpected weight change.  HENT: Negative for ear pain, congestion, sore throat, trouble swallowing, neck pain, voice change and sinus pressure.   Eyes: Negative for visual disturbance.  Respiratory: Negative for cough, shortness of breath, wheezing and stridor.   Cardiovascular: Negative for chest pain, palpitations and leg swelling.  Gastrointestinal: Negative for nausea, vomiting, abdominal pain, diarrhea, constipation, blood in stool, abdominal distention and anal bleeding.  Genitourinary: Negative for dysuria and flank pain.  Musculoskeletal: Negative for myalgias, arthralgias and gait problem.  Skin: Positive for color change and rash.  Neurological: Negative for dizziness and headaches.  Hematological: Negative for adenopathy. Does not bruise/bleed easily.  Psychiatric/Behavioral: Negative for suicidal  ideas, disturbed wake/sleep cycle and dysphoric mood. The patient is not nervous/anxious.        Objective:   Physical Exam  Constitutional: She is oriented to person, place, and time. She appears well-developed and well-nourished. No distress.  HENT:  Head: Normocephalic and atraumatic.  Right Ear: External ear normal.  Left Ear: External ear normal.  Nose: Nose normal.  Mouth/Throat: Oropharynx is clear and moist. No oropharyngeal exudate.  Eyes: Conjunctivae are normal. Pupils are equal, round, and reactive to light. Right eye exhibits no discharge. Left eye exhibits no discharge. No scleral icterus.  Neck: Normal range of  motion. Neck supple. No tracheal deviation present. No thyromegaly present.  Pulmonary/Chest: Effort normal.  Musculoskeletal: Normal range of motion. She exhibits no edema and no tenderness.  Lymphadenopathy:    She has no cervical adenopathy.  Neurological: She is alert and oriented to person, place, and time.  Skin: Skin is warm and dry. Rash noted. She is not diaphoretic. There is erythema. No pallor.     Psychiatric: She has a normal mood and affect. Her behavior is normal. Judgment and thought content normal.          Assessment & Plan:

## 2012-07-21 ENCOUNTER — Ambulatory Visit: Payer: Self-pay | Admitting: Surgery

## 2012-07-22 ENCOUNTER — Ambulatory Visit: Payer: 59 | Admitting: Internal Medicine

## 2012-07-30 ENCOUNTER — Ambulatory Visit (INDEPENDENT_AMBULATORY_CARE_PROVIDER_SITE_OTHER): Payer: 59 | Admitting: Internal Medicine

## 2012-07-30 ENCOUNTER — Encounter: Payer: Self-pay | Admitting: Internal Medicine

## 2012-07-30 VITALS — BP 112/64 | HR 67 | Temp 98.3°F | Ht <= 58 in | Wt 157.0 lb

## 2012-07-30 DIAGNOSIS — G8929 Other chronic pain: Secondary | ICD-10-CM

## 2012-07-30 DIAGNOSIS — M545 Low back pain, unspecified: Secondary | ICD-10-CM

## 2012-07-30 DIAGNOSIS — E119 Type 2 diabetes mellitus without complications: Secondary | ICD-10-CM

## 2012-07-30 DIAGNOSIS — I10 Essential (primary) hypertension: Secondary | ICD-10-CM

## 2012-07-30 NOTE — Assessment & Plan Note (Signed)
BP well controlled on current medications. Recent renal function and urine microalbumin normal 05/2012. Follow up 3 months and prn.

## 2012-07-30 NOTE — Assessment & Plan Note (Signed)
Last A1c 05/2012 was 5.9%. Will continue Metformin. Follow up 3 months with repeat A1c.

## 2012-07-30 NOTE — Assessment & Plan Note (Signed)
Secondary to degenerative osteoarthritis. Symptoms well controlled with Vicodin as needed. Patient only using this rarely. Will continue.

## 2012-07-30 NOTE — Progress Notes (Signed)
Subjective:    Patient ID: Olivia Benton, female    DOB: 04/11/1956, 56 y.o.   MRN: [id number]  HPI 56 year old female with history of diabetes, hypertension presents for followup. She reports she is generally doing well. In regards to her diabetes, she reports blood sugars have been well controlled. Recent A1c performed in June 2013 was 5.9%. She reports full compliance with her medication.  In regards to her hypertension, she also reports full compliance with medication. She has not been regularly checking her blood pressure. However, she denies headache, chest pain, or palpitations.  She continues to have some mild lower back pain. Occasionally, if the pain is keeping her up at night, she will take Vicodin to help with symptoms. She reports significant improvement in pain with use of Vicodin.  Outpatient Encounter Prescriptions as of 07/30/2012  Medication Sig Dispense Refill  . albuterol (PROAIR HFA) 108 (90 BASE) MCG/ACT inhaler Inhale 2 puffs into the lungs every 6 (six) hours as needed for wheezing.  3.7 g  6  . aspirin 81 MG tablet Take 81 mg by mouth daily.        Marland Kitchen doxycycline (VIBRAMYCIN) 100 MG capsule Take 100 mg by mouth daily.        . fluticasone (FLONASE) 50 MCG/ACT nasal spray Place 2 sprays into the nose daily.  16 g  3  . fluticasone (FLOVENT HFA) 220 MCG/ACT inhaler Inhale 1 puff into the lungs 2 (two) times daily.  1 Inhaler  12  . glucose blood (FREESTYLE LITE) test strip Use as directed  300 each  3  . HYDROcodone-acetaminophen (VICODIN) 5-500 MG per tablet Take 1 tablet by mouth every 6 (six) hours as needed (cough).  90 tablet  0  . Lancets (FREESTYLE) lancets Use as instructed  300 each  3  . losartan-hydrochlorothiazide (HYZAAR) 100-12.5 MG per tablet Take 1 tablet by mouth daily.  90 tablet  3  . metFORMIN (GLUCOPHAGE) 500 MG tablet Take 1 tablet (500 mg total) by mouth daily.  90 tablet  4  . montelukast (SINGULAIR) 10 MG tablet Take 1 tablet (10 mg total) by  mouth at bedtime.  90 tablet  3  . Multiple Minerals-Vitamins (CALCIUM & VIT D3 BONE HEALTH PO) Take by mouth daily.      Marland Kitchen omeprazole (PRILOSEC) 40 MG capsule Take 1 capsule (40 mg total) by mouth daily.  90 capsule  3  . tretinoin (RETIN-A) 0.05 % cream Apply 1 application topically at bedtime.      . vitamin C (ASCORBIC ACID) 500 MG tablet Take 500 mg by mouth daily.      Marland Kitchen DISCONTD: triamcinolone ointment (KENALOG) 0.5 % Apply topically 2 (two) times daily.  30 g  0    Review of Systems  Constitutional: Negative for fever, chills, appetite change, fatigue and unexpected weight change.  HENT: Negative for ear pain, congestion, sore throat, trouble swallowing, neck pain, voice change and sinus pressure.   Eyes: Negative for visual disturbance.  Respiratory: Negative for cough, shortness of breath, wheezing and stridor.   Cardiovascular: Negative for chest pain, palpitations and leg swelling.  Gastrointestinal: Negative for nausea, vomiting, abdominal pain, diarrhea, constipation, blood in stool, abdominal distention and anal bleeding.  Genitourinary: Negative for dysuria and flank pain.  Musculoskeletal: Positive for back pain. Negative for myalgias, arthralgias and gait problem.  Skin: Negative for color change and rash.  Neurological: Negative for dizziness and headaches.  Hematological: Negative for adenopathy. Does not bruise/bleed easily.  Psychiatric/Behavioral:  Negative for suicidal ideas, disturbed wake/sleep cycle and dysphoric mood. The patient is not nervous/anxious.        Objective:   Physical Exam  Constitutional: She is oriented to person, place, and time. She appears well-developed and well-nourished. No distress.  HENT:  Head: Normocephalic and atraumatic.  Right Ear: External ear normal.  Left Ear: External ear normal.  Nose: Nose normal.  Mouth/Throat: Oropharynx is clear and moist. No oropharyngeal exudate.  Eyes: Conjunctivae are normal. Pupils are equal,  round, and reactive to light. Right eye exhibits no discharge. Left eye exhibits no discharge. No scleral icterus.  Neck: Normal range of motion. Neck supple. No tracheal deviation present. No thyromegaly present.  Cardiovascular: Normal rate, regular rhythm, normal heart sounds and intact distal pulses.  Exam reveals no gallop and no friction rub.   No murmur heard. Pulmonary/Chest: Effort normal and breath sounds normal. No respiratory distress. She has no wheezes. She has no rales. She exhibits no tenderness.  Musculoskeletal: Normal range of motion. She exhibits no edema and no tenderness.  Lymphadenopathy:    She has no cervical adenopathy.  Neurological: She is alert and oriented to person, place, and time. No cranial nerve deficit. She exhibits normal muscle tone. Coordination normal.  Skin: Skin is warm and dry. No rash noted. She is not diaphoretic. No erythema. No pallor.  Psychiatric: She has a normal mood and affect. Her behavior is normal. Judgment and thought content normal.          Assessment & Plan:

## 2012-08-04 LAB — HM MAMMOGRAPHY: HM Mammogram: NORMAL

## 2012-08-06 ENCOUNTER — Encounter: Payer: Self-pay | Admitting: Internal Medicine

## 2012-10-28 ENCOUNTER — Other Ambulatory Visit: Payer: Self-pay | Admitting: Internal Medicine

## 2012-10-30 ENCOUNTER — Encounter: Payer: Self-pay | Admitting: Internal Medicine

## 2012-10-30 ENCOUNTER — Ambulatory Visit (INDEPENDENT_AMBULATORY_CARE_PROVIDER_SITE_OTHER): Payer: 59 | Admitting: Internal Medicine

## 2012-10-30 ENCOUNTER — Encounter: Payer: Self-pay | Admitting: *Deleted

## 2012-10-30 VITALS — BP 126/78 | HR 91 | Temp 98.0°F | Ht <= 58 in | Wt 162.5 lb

## 2012-10-30 DIAGNOSIS — E119 Type 2 diabetes mellitus without complications: Secondary | ICD-10-CM

## 2012-10-30 DIAGNOSIS — I10 Essential (primary) hypertension: Secondary | ICD-10-CM

## 2012-10-30 LAB — COMPREHENSIVE METABOLIC PANEL
ALT: 36 IU/L — ABNORMAL HIGH (ref 0–32)
Alkaline Phosphatase: 70 IU/L (ref 39–117)
BUN/Creatinine Ratio: 26 — ABNORMAL HIGH (ref 9–23)
BUN: 22 mg/dL (ref 6–24)
CO2: 23 mmol/L (ref 19–28)
Chloride: 98 mmol/L (ref 97–108)
GFR calc Af Amer: 89 mL/min/{1.73_m2} (ref >59)
Glucose: 96 mg/dL (ref 65–99)
Potassium: 4.3 mmol/L (ref 3.5–5.2)
Total Bilirubin: 0.5 mg/dL (ref 0.0–1.2)

## 2012-10-30 LAB — LIPID PANEL W/O CHOL/HDL RATIO
Cholesterol, Total: 145 mg/dL (ref 100–199)
Triglycerides: 171 mg/dL — ABNORMAL HIGH (ref 0–149)

## 2012-10-30 NOTE — Assessment & Plan Note (Signed)
Blood sugars have been well controlled with current medications. Recent A1c was 6.2%. We'll continue metformin. We'll plan to recheck A1c in 3-4 months. Encouraged continued efforts at healthy diet and regular physical activity.

## 2012-10-30 NOTE — Assessment & Plan Note (Signed)
Blood pressure well-controlled on losartan hydrochlorothiazide. Recent renal function was normal. Will continue to monitor. Followup in 3-4 months.

## 2012-10-30 NOTE — Progress Notes (Signed)
Subjective:    Patient ID: Olivia Benton, female    DOB: [birth year], 56 y.o.   MRN: [id number]  HPI 56 year old female with history of diabetes, hypertension presents for followup. She reports she is feeling well. She did not bring a record of her blood sugars today but reports they have been well-controlled. Hemoglobin A1c on recent check was 6.2%. She reports compliance with her medications. She denies any new concerns today.  Outpatient Encounter Prescriptions as of 10/30/2012  Medication Sig Dispense Refill  . albuterol (PROAIR HFA) 108 (90 BASE) MCG/ACT inhaler Inhale 2 puffs into the lungs every 6 (six) hours as needed for wheezing.  3.7 g  6  . aspirin 81 MG tablet Take 81 mg by mouth daily.        . fluticasone (FLONASE) 50 MCG/ACT nasal spray Place 2 sprays into the nose daily.  16 g  3  . fluticasone (FLOVENT HFA) 220 MCG/ACT inhaler Inhale 1 puff into the lungs 2 (two) times daily.  1 Inhaler  12  . glucose blood (FREESTYLE LITE) test strip Use as directed  300 each  3  . HYDROcodone-acetaminophen (VICODIN) 5-500 MG per tablet Take 1 tablet by mouth every 6 (six) hours as needed (cough).  90 tablet  0  . Lancets (FREESTYLE) lancets Use as instructed  300 each  3  . losartan-hydrochlorothiazide (HYZAAR) 100-12.5 MG per tablet Take 1 tablet by mouth daily.  90 tablet  3  . metFORMIN (GLUCOPHAGE) 500 MG tablet Take 1 tablet (500 mg total) by mouth daily.  90 tablet  4  . montelukast (SINGULAIR) 10 MG tablet Take 1 tablet (10 mg total) by mouth at bedtime.  90 tablet  3  . Multiple Minerals-Vitamins (CALCIUM & VIT D3 BONE HEALTH PO) Take by mouth daily.      Marland Kitchen omeprazole (PRILOSEC) 40 MG capsule Take 1 capsule (40 mg total) by mouth daily.  90 capsule  3  . tretinoin (RETIN-A) 0.05 % cream Apply 1 application topically at bedtime.      . vitamin C (ASCORBIC ACID) 500 MG tablet Take 500 mg by mouth daily.      . [DISCONTINUED] doxycycline (VIBRAMYCIN) 100 MG capsule Take 100 mg by  mouth daily.         BP 126/78  Pulse 91  Temp 98 F (36.7 C) (Oral)  Ht 4\' 10"  (1.473 m)  Wt 162 lb 8 oz (73.71 kg)  BMI 33.96 kg/m2  SpO2 95%  Review of Systems  Constitutional: Negative for fever, chills, appetite change, fatigue and unexpected weight change.  HENT: Negative for ear pain, congestion, sore throat, trouble swallowing, neck pain, voice change and sinus pressure.   Eyes: Negative for visual disturbance.  Respiratory: Negative for cough, shortness of breath, wheezing and stridor.   Cardiovascular: Negative for chest pain, palpitations and leg swelling.  Gastrointestinal: Negative for nausea, vomiting, abdominal pain, diarrhea, constipation, blood in stool, abdominal distention and anal bleeding.  Genitourinary: Negative for dysuria and flank pain.  Musculoskeletal: Negative for myalgias, arthralgias and gait problem.  Skin: Negative for color change and rash.  Neurological: Negative for dizziness and headaches.  Hematological: Negative for adenopathy. Does not bruise/bleed easily.  Psychiatric/Behavioral: Negative for suicidal ideas, sleep disturbance and dysphoric mood. The patient is not nervous/anxious.        Objective:   Physical Exam  Constitutional: She is oriented to person, place, and time. She appears well-developed and well-nourished. No distress.  HENT:  Head: Normocephalic and atraumatic.  Right Ear: External ear normal.  Left Ear: External ear normal.  Nose: Nose normal.  Mouth/Throat: Oropharynx is clear and moist. No oropharyngeal exudate.  Eyes: Conjunctivae normal are normal. Pupils are equal, round, and reactive to light. Right eye exhibits no discharge. Left eye exhibits no discharge. No scleral icterus.  Neck: Normal range of motion. Neck supple. No tracheal deviation present. No thyromegaly present.  Cardiovascular: Normal rate, regular rhythm, normal heart sounds and intact distal pulses.  Exam reveals no gallop and no friction rub.   No  murmur heard. Pulmonary/Chest: Effort normal and breath sounds normal. No respiratory distress. She has no wheezes. She has no rales. She exhibits no tenderness.  Musculoskeletal: Normal range of motion. She exhibits no edema and no tenderness.  Lymphadenopathy:    She has no cervical adenopathy.  Neurological: She is alert and oriented to person, place, and time. No cranial nerve deficit. She exhibits normal muscle tone. Coordination normal.  Skin: Skin is warm and dry. No rash noted. She is not diaphoretic. No erythema. No pallor.  Psychiatric: She has a normal mood and affect. Her behavior is normal. Judgment and thought content normal.          Assessment & Plan:

## 2012-11-11 ENCOUNTER — Other Ambulatory Visit: Payer: Self-pay | Admitting: Internal Medicine

## 2013-02-18 ENCOUNTER — Other Ambulatory Visit: Payer: Self-pay | Admitting: *Deleted

## 2013-02-18 DIAGNOSIS — I10 Essential (primary) hypertension: Secondary | ICD-10-CM

## 2013-02-18 MED ORDER — LOSARTAN POTASSIUM-HCTZ 100-12.5 MG PO TABS: 1.0000 | ORAL_TABLET | Freq: Every day | ORAL | Status: DC

## 2013-02-18 NOTE — Telephone Encounter (Signed)
Patient left voicemail stating she need refill on her BP medication for  30 day supply sent to [facility]. Informed patient we have a new protocol, she has to call her pharmacy and they will fax [place] the request for her. She stated her mail company will not do that, she has to bring a form to the office for completion. She will do that when she comes in for her follow up appointment this month.  A 30 day supply sent to pharmacy on file

## 2013-03-04 ENCOUNTER — Encounter: Payer: Self-pay | Admitting: Internal Medicine

## 2013-03-04 ENCOUNTER — Ambulatory Visit (INDEPENDENT_AMBULATORY_CARE_PROVIDER_SITE_OTHER): Payer: 59 | Admitting: Internal Medicine

## 2013-03-04 VITALS — BP 106/70 | HR 90 | Temp 98.5°F | Wt 159.0 lb

## 2013-03-04 DIAGNOSIS — E119 Type 2 diabetes mellitus without complications: Secondary | ICD-10-CM

## 2013-03-04 DIAGNOSIS — J309 Allergic rhinitis, unspecified: Secondary | ICD-10-CM

## 2013-03-04 DIAGNOSIS — Z1211 Encounter for screening for malignant neoplasm of colon: Secondary | ICD-10-CM

## 2013-03-04 DIAGNOSIS — I10 Essential (primary) hypertension: Secondary | ICD-10-CM

## 2013-03-04 MED ORDER — FLUTICASONE PROPIONATE 50 MCG/ACT NA SUSP: 2.0000 | Freq: Every day | NASAL | Status: DC

## 2013-03-04 MED ORDER — LOSARTAN POTASSIUM-HCTZ 100-12.5 MG PO TABS: 1.0000 | ORAL_TABLET | Freq: Every day | ORAL | Status: DC

## 2013-03-04 MED ORDER — MONTELUKAST SODIUM 10 MG PO TABS: 10.0000 mg | ORAL_TABLET | Freq: Every day | ORAL | Status: DC

## 2013-03-04 NOTE — Assessment & Plan Note (Signed)
Will set screening colonoscopy.

## 2013-03-04 NOTE — Assessment & Plan Note (Signed)
BP Readings from Last 3 Encounters:  03/04/13 106/70  10/30/12 126/78  07/30/12 112/64   BP well controlled. Continue current medications. Renal function with labs at [facility] today.

## 2013-03-04 NOTE — Progress Notes (Signed)
Subjective:    Patient ID: Olivia Benton, female    DOB: [birth year], 57 y.o.   MRN: [id number]  HPI 57YO female with h/o DM, HTN presents for follow up. Doing well. Reports BG well controlled, but did not bring record today. No headache, chest pain, palpitations. Compliant with meds. No new concerns today.  Outpatient Encounter Prescriptions as of 03/04/2013  Medication Sig Dispense Refill  . aspirin 81 MG tablet Take 81 mg by mouth daily.        . fluticasone (FLONASE) 50 MCG/ACT nasal spray Place 2 sprays into the nose daily.  16 g  3  . glucose blood (FREESTYLE LITE) test strip Use as directed  300 each  3  . HYDROcodone-acetaminophen (VICODIN) 5-500 MG per tablet Take 1 tablet by mouth every 6 (six) hours as needed (cough).  90 tablet  0  . losartan-hydrochlorothiazide (HYZAAR) 100-12.5 MG per tablet Take 1 tablet by mouth daily.  90 tablet  4  . metFORMIN (GLUCOPHAGE) 500 MG tablet TAKE ONE TABLET BY MOUTH ONE TIME DAILY  90 tablet  3  . montelukast (SINGULAIR) 10 MG tablet Take 1 tablet (10 mg total) by mouth at bedtime.  90 tablet  4  . omeprazole (PRILOSEC) 40 MG capsule Take 1 capsule (40 mg total) by mouth daily.  90 capsule  3  . tretinoin (RETIN-A) 0.05 % cream Apply 1 application topically at bedtime.      . vitamin C (ASCORBIC ACID) 500 MG tablet Take 500 mg by mouth daily.      Marland Kitchen albuterol (PROAIR HFA) 108 (90 BASE) MCG/ACT inhaler Inhale 2 puffs into the lungs every 6 (six) hours as needed for wheezing.  3.7 g  6  . fluticasone (FLOVENT HFA) 220 MCG/ACT inhaler Inhale 1 puff into the lungs 2 (two) times daily.  1 Inhaler  12  . Multiple Minerals-Vitamins (CALCIUM & VIT D3 BONE HEALTH PO) Take by mouth daily.       No facility-administered encounter medications on file as of 03/04/2013.   BP 106/70  Pulse 90  Temp(Src) 98.5 F (36.9 C) (Oral)  Wt 159 lb (72.122 kg)  BMI 33.24 kg/m2  SpO2 97%  Review of Systems  Constitutional: Negative for fever, chills, appetite  change, fatigue and unexpected weight change.  HENT: Negative for ear pain, congestion, sore throat, trouble swallowing, neck pain, voice change and sinus pressure.   Eyes: Negative for visual disturbance.  Respiratory: Negative for cough, shortness of breath, wheezing and stridor.   Cardiovascular: Negative for chest pain, palpitations and leg swelling.  Gastrointestinal: Negative for nausea, vomiting, abdominal pain, diarrhea, constipation, blood in stool, abdominal distention and anal bleeding.  Genitourinary: Negative for dysuria and flank pain.  Musculoskeletal: Negative for myalgias, arthralgias and gait problem.  Skin: Negative for color change and rash.  Neurological: Negative for dizziness and headaches.  Hematological: Negative for adenopathy. Does not bruise/bleed easily.  Psychiatric/Behavioral: Negative for suicidal ideas, sleep disturbance and dysphoric mood. The patient is not nervous/anxious.        Objective:   Physical Exam  Constitutional: She is oriented to person, place, and time. She appears well-developed and well-nourished. No distress.  HENT:  Head: Normocephalic and atraumatic.  Right Ear: External ear normal.  Left Ear: External ear normal.  Nose: Nose normal.  Mouth/Throat: Oropharynx is clear and moist. No oropharyngeal exudate.  Eyes: Conjunctivae are normal. Pupils are equal, round, and reactive to light. Right eye exhibits no discharge. Left eye exhibits no discharge.  No scleral icterus.  Neck: Normal range of motion. Neck supple. No tracheal deviation present. No thyromegaly present.  Cardiovascular: Normal rate, regular rhythm, normal heart sounds and intact distal pulses.  Exam reveals no gallop and no friction rub.   No murmur heard. Pulmonary/Chest: Effort normal and breath sounds normal. No respiratory distress. She has no wheezes. She has no rales. She exhibits no tenderness.  Musculoskeletal: Normal range of motion. She exhibits no edema and no  tenderness.  Lymphadenopathy:    She has no cervical adenopathy.  Neurological: She is alert and oriented to person, place, and time. No cranial nerve deficit. She exhibits normal muscle tone. Coordination normal.  Skin: Skin is warm and dry. No rash noted. She is not diaphoretic. No erythema. No pallor.  Psychiatric: She has a normal mood and affect. Her behavior is normal. Judgment and thought content normal.          Assessment & Plan:

## 2013-03-04 NOTE — Assessment & Plan Note (Signed)
Pt reports good control of BG. Will check A1c with labs through [facility]. Continue current medications. Follow up 3 months.

## 2013-03-10 LAB — HM COLONOSCOPY: HM Colonoscopy: NORMAL

## 2013-04-09 ENCOUNTER — Telehealth: Payer: Self-pay | Admitting: Internal Medicine

## 2013-04-09 NOTE — Telephone Encounter (Signed)
Labs from 04/08/2013 showed normal kidney and liver function, slightly elevated cholesterol with elevated triglycerides, normal urine protein levels, and A1c of 6.2%.

## 2013-04-10 NOTE — Telephone Encounter (Signed)
Patient informed and verbalized understanding

## 2013-04-28 ENCOUNTER — Ambulatory Visit: Payer: Self-pay | Admitting: Surgery

## 2013-04-28 LAB — HM COLONOSCOPY

## 2013-06-04 ENCOUNTER — Ambulatory Visit: Payer: 59 | Admitting: Internal Medicine

## 2013-07-01 LAB — LIPID PANEL: LDL Cholesterol: 88 mg/dL

## 2013-07-01 LAB — HEMOGLOBIN A1C: Hgb A1c MFr Bld: 6.3 % — AB (ref 4.0–6.0)

## 2013-07-10 ENCOUNTER — Ambulatory Visit (INDEPENDENT_AMBULATORY_CARE_PROVIDER_SITE_OTHER): Payer: 59 | Admitting: Internal Medicine

## 2013-07-10 ENCOUNTER — Encounter: Payer: Self-pay | Admitting: Internal Medicine

## 2013-07-10 VITALS — BP 108/70 | HR 73 | Temp 98.1°F | Wt 160.0 lb

## 2013-07-10 DIAGNOSIS — E669 Obesity, unspecified: Secondary | ICD-10-CM

## 2013-07-10 DIAGNOSIS — K219 Gastro-esophageal reflux disease without esophagitis: Secondary | ICD-10-CM

## 2013-07-10 DIAGNOSIS — J309 Allergic rhinitis, unspecified: Secondary | ICD-10-CM

## 2013-07-10 DIAGNOSIS — I10 Essential (primary) hypertension: Secondary | ICD-10-CM

## 2013-07-10 DIAGNOSIS — E119 Type 2 diabetes mellitus without complications: Secondary | ICD-10-CM

## 2013-07-10 DIAGNOSIS — E66811 Obesity, class 1: Secondary | ICD-10-CM | POA: Insufficient documentation

## 2013-07-10 DIAGNOSIS — Z1239 Encounter for other screening for malignant neoplasm of breast: Secondary | ICD-10-CM

## 2013-07-10 LAB — HM DIABETES FOOT EXAM: HM Diabetic Foot Exam: NORMAL

## 2013-07-10 MED ORDER — FLUTICASONE PROPIONATE 50 MCG/ACT NA SUSP: 2.0000 | Freq: Every day | NASAL | Status: DC

## 2013-07-10 MED ORDER — OMEPRAZOLE 40 MG PO CPDR
40.0000 mg | DELAYED_RELEASE_CAPSULE | Freq: Every day | ORAL | Status: DC
Start: 2013-07-10 — End: 2014-06-30

## 2013-07-10 NOTE — Assessment & Plan Note (Signed)
Recent A1c well controlled at 6.3%. We'll continue metformin. Encouraged efforts at healthy diet, low in processed carbohydrates with goal of weight loss.

## 2013-07-10 NOTE — Assessment & Plan Note (Signed)
Symptoms well controlled with Flonase. Will continue. 

## 2013-07-10 NOTE — Assessment & Plan Note (Signed)
Wt Readings from Last 3 Encounters:  07/10/13 160 lb (72.576 kg)  03/04/13 159 lb (72.122 kg)  10/30/12 162 lb 8 oz (73.71 kg)   Encouraged healthy diet, low in processed carbohydrates with caloric restriction and goal of weight loss. Encouraged regular physical activity with goal of 40 minutes 3 times per week.

## 2013-07-10 NOTE — Progress Notes (Signed)
Subjective:    Patient ID: Olivia Benton, female    DOB: 1956-06-01, 57 y.o.   MRN: [id number]  HPI 57 year old female with history of diabetes, hypertension, allergic rhinitis presents for followup. She reports she is generally feeling well. Blood sugars have been. Well-controlled. Recent A1c performed at her work was 6.3%. Recent total cholesterol is 149. She notes some dietary indiscretion recently after her vacation. She is not currently following an exercise program. No new concerns today. No recent headache, chest pain, palpitations.  Outpatient Encounter Prescriptions as of 07/10/2013  Medication Sig Dispense Refill  . aspirin 81 MG tablet Take 81 mg by mouth daily.        . fluticasone (FLONASE) 50 MCG/ACT nasal spray Place 2 sprays into the nose daily.  16 g  3  . glucose blood (FREESTYLE LITE) test strip Use as directed  300 each  3  . HYDROcodone-acetaminophen (VICODIN) 5-500 MG per tablet Take 1 tablet by mouth every 6 (six) hours as needed (cough).  90 tablet  0  . losartan-hydrochlorothiazide (HYZAAR) 100-12.5 MG per tablet Take 1 tablet by mouth daily.  90 tablet  4  . metFORMIN (GLUCOPHAGE) 500 MG tablet TAKE ONE TABLET BY MOUTH ONE TIME DAILY  90 tablet  3  . montelukast (SINGULAIR) 10 MG tablet Take 1 tablet (10 mg total) by mouth at bedtime.  90 tablet  4  . Multiple Minerals-Vitamins (CALCIUM & VIT D3 BONE HEALTH PO) Take by mouth daily.      Marland Kitchen omeprazole (PRILOSEC) 40 MG capsule Take 1 capsule (40 mg total) by mouth daily.  90 capsule  3  . tretinoin (RETIN-A) 0.05 % cream Apply 1 application topically at bedtime.      . vitamin C (ASCORBIC ACID) 500 MG tablet Take 500 mg by mouth daily.      . [DISCONTINUED] fluticasone (FLONASE) 50 MCG/ACT nasal spray Place 2 sprays into the nose daily.  16 g  3  . [DISCONTINUED] omeprazole (PRILOSEC) 40 MG capsule Take 1 capsule (40 mg total) by mouth daily.  90 capsule  3  . albuterol (PROAIR HFA) 108 (90 BASE) MCG/ACT inhaler  Inhale 2 puffs into the lungs every 6 (six) hours as needed for wheezing.  3.7 g  6  . fluticasone (FLOVENT HFA) 220 MCG/ACT inhaler Inhale 1 puff into the lungs 2 (two) times daily.  1 Inhaler  12   No facility-administered encounter medications on file as of 07/10/2013.   BP 108/70  Pulse 73  Temp(Src) 98.1 F (36.7 C) (Oral)  Wt 160 lb (72.576 kg)  BMI 33.45 kg/m2  SpO2 94%  Review of Systems  Constitutional: Negative for fever, chills, appetite change, fatigue and unexpected weight change.  HENT: Negative for ear pain, congestion, sore throat, trouble swallowing, neck pain, voice change and sinus pressure.   Eyes: Negative for visual disturbance.  Respiratory: Negative for cough, shortness of breath, wheezing and stridor.   Cardiovascular: Negative for chest pain, palpitations and leg swelling.  Gastrointestinal: Negative for nausea, vomiting, abdominal pain, diarrhea, constipation, blood in stool, abdominal distention and anal bleeding.  Genitourinary: Negative for dysuria and flank pain.  Musculoskeletal: Negative for myalgias, arthralgias and gait problem.  Skin: Negative for color change and rash.  Neurological: Negative for dizziness and headaches.  Hematological: Negative for adenopathy. Does not bruise/bleed easily.  Psychiatric/Behavioral: Negative for suicidal ideas, sleep disturbance and dysphoric mood. The patient is not nervous/anxious.        Objective:   Physical Exam  Constitutional: She is oriented to person, place, and time. She appears well-developed and well-nourished. No distress.  HENT:  Head: Normocephalic and atraumatic.  Right Ear: External ear normal.  Left Ear: External ear normal.  Nose: Nose normal.  Mouth/Throat: Oropharynx is clear and moist. No oropharyngeal exudate.  Eyes: Conjunctivae are normal. Pupils are equal, round, and reactive to light. Right eye exhibits no discharge. Left eye exhibits no discharge. No scleral icterus.  Neck: Normal  range of motion. Neck supple. No tracheal deviation present. No thyromegaly present.  Cardiovascular: Normal rate, regular rhythm, normal heart sounds and intact distal pulses.  Exam reveals no gallop and no friction rub.   No murmur heard. Pulmonary/Chest: Effort normal and breath sounds normal. No accessory muscle usage. Not tachypneic. No respiratory distress. She has no decreased breath sounds. She has no wheezes. She has no rhonchi. She has no rales. She exhibits no tenderness.  Musculoskeletal: Normal range of motion. She exhibits no edema and no tenderness.  Lymphadenopathy:    She has no cervical adenopathy.  Neurological: She is alert and oriented to person, place, and time. No cranial nerve deficit. She exhibits normal muscle tone. Coordination normal.  Skin: Skin is warm and dry. No rash noted. She is not diaphoretic. No erythema. No pallor.  Psychiatric: She has a normal mood and affect. Her behavior is normal. Judgment and thought content normal.          Assessment & Plan:

## 2013-07-10 NOTE — Assessment & Plan Note (Signed)
BP Readings from Last 3 Encounters:  07/10/13 108/70  03/04/13 106/70  10/30/12 126/78   Blood pressure well-controlled with losartan hydrochlorothiazide. Will continue.

## 2013-09-22 ENCOUNTER — Ambulatory Visit: Payer: Self-pay | Admitting: Internal Medicine

## 2013-09-22 LAB — HM MAMMOGRAPHY: HM Mammogram: NORMAL

## 2013-09-28 LAB — HM MAMMOGRAPHY

## 2013-10-14 ENCOUNTER — Encounter: Payer: Self-pay | Admitting: Internal Medicine

## 2013-10-15 ENCOUNTER — Ambulatory Visit: Payer: 59 | Admitting: Internal Medicine

## 2013-10-20 ENCOUNTER — Ambulatory Visit: Payer: 59 | Admitting: Internal Medicine

## 2013-10-28 ENCOUNTER — Ambulatory Visit: Payer: 59 | Admitting: Internal Medicine

## 2013-10-29 ENCOUNTER — Ambulatory Visit (INDEPENDENT_AMBULATORY_CARE_PROVIDER_SITE_OTHER): Payer: 59 | Admitting: Internal Medicine

## 2013-10-29 ENCOUNTER — Telehealth: Payer: Self-pay | Admitting: Internal Medicine

## 2013-10-29 ENCOUNTER — Encounter: Payer: Self-pay | Admitting: Internal Medicine

## 2013-10-29 VITALS — BP 90/58 | HR 102 | Temp 98.6°F | Wt 162.0 lb

## 2013-10-29 DIAGNOSIS — IMO0001 Reserved for inherently not codable concepts without codable children: Secondary | ICD-10-CM

## 2013-10-29 DIAGNOSIS — E119 Type 2 diabetes mellitus without complications: Secondary | ICD-10-CM

## 2013-10-29 DIAGNOSIS — I10 Essential (primary) hypertension: Secondary | ICD-10-CM

## 2013-10-29 DIAGNOSIS — E669 Obesity, unspecified: Secondary | ICD-10-CM

## 2013-10-29 DIAGNOSIS — J309 Allergic rhinitis, unspecified: Secondary | ICD-10-CM

## 2013-10-29 MED ORDER — FLUTICASONE PROPIONATE 50 MCG/ACT NA SUSP: 2.0000 | Freq: Every day | NASAL | Status: DC

## 2013-10-29 MED ORDER — METFORMIN HCL 500 MG PO TABS: ORAL_TABLET | ORAL | Status: DC

## 2013-10-29 NOTE — Progress Notes (Signed)
Pre-visit discussion using our clinic review tool. No additional management support is needed unless otherwise documented below in the visit note.  

## 2013-10-29 NOTE — Assessment & Plan Note (Signed)
BP Readings from Last 3 Encounters:  10/29/13 90/58  07/10/13 108/70  03/04/13 106/70   BP low today. Discussed potentially stopping HCTZ. She will monitor BP at home and email with readings. Will check renal function with labs today.

## 2013-10-29 NOTE — Progress Notes (Signed)
Subjective:    Patient ID: Olivia Benton, female    DOB: [birth year], 57 y.o.   MRN: [id number]  HPI 57 year old female with history of diabetes, hypertension presents for followup. She reports she is generally feeling well. She reports blood sugars have been well-controlled. She did not bring record with her today. She is compliant with her medications. Her only concern today is intermittent cramping at night in her left calf and ankle. This is been ongoing for years. She has tried alternative methods such as putting a bar of soap and her bed and drinking tonic water with no improvement. She denies swelling in her leg. She denies pain in her calf outside of the cramping.   Outpatient Encounter Prescriptions as of 10/29/2013  Medication Sig  . aspirin 81 MG tablet Take 81 mg by mouth daily.    Marland Kitchen desonide (DESOWEN) 0.05 % cream   . fluticasone (FLONASE) 50 MCG/ACT nasal spray Place 2 sprays into both nostrils daily.  Marland Kitchen glucose blood (FREESTYLE LITE) test strip Use as directed  . HYDROcodone-acetaminophen (VICODIN) 5-500 MG per tablet Take 1 tablet by mouth every 6 (six) hours as needed (cough).  . losartan-hydrochlorothiazide (HYZAAR) 100-12.5 MG per tablet Take 1 tablet by mouth daily.  . metFORMIN (GLUCOPHAGE) 500 MG tablet TAKE ONE TABLET BY MOUTH ONE TIME DAILY  . montelukast (SINGULAIR) 10 MG tablet Take 1 tablet (10 mg total) by mouth at bedtime.  Marland Kitchen omeprazole (PRILOSEC) 40 MG capsule Take 1 capsule (40 mg total) by mouth daily.  Marland Kitchen tretinoin (RETIN-A) 0.05 % cream Apply 1 application topically at bedtime.  . vitamin C (ASCORBIC ACID) 500 MG tablet Take 500 mg by mouth daily.  Marland Kitchen albuterol (PROAIR HFA) 108 (90 BASE) MCG/ACT inhaler Inhale 2 puffs into the lungs every 6 (six) hours as needed for wheezing.  . fluticasone (FLOVENT HFA) 220 MCG/ACT inhaler Inhale 1 puff into the lungs 2 (two) times daily.  . Multiple Minerals-Vitamins (CALCIUM & VIT D3 BONE HEALTH PO) Take by mouth daily.    BP 90/58  Pulse 102  Temp(Src) 98.6 F (37 C) (Oral)  Wt 162 lb (73.483 kg)  SpO2 96%  Review of Systems  Constitutional: Negative for fever, chills, appetite change, fatigue and unexpected weight change.  HENT: Negative for congestion, ear pain, sinus pressure, sore throat, trouble swallowing and voice change.   Eyes: Negative for visual disturbance.  Respiratory: Negative for cough, shortness of breath, wheezing and stridor.   Cardiovascular: Negative for chest pain, palpitations and leg swelling.  Gastrointestinal: Negative for nausea, vomiting, abdominal pain, diarrhea, constipation, blood in stool, abdominal distention and anal bleeding.  Genitourinary: Negative for dysuria and flank pain.  Musculoskeletal: Positive for myalgias (left calf cramping at night). Negative for arthralgias, gait problem and neck pain.  Skin: Negative for color change and rash.  Neurological: Negative for dizziness and headaches.  Hematological: Negative for adenopathy. Does not bruise/bleed easily.  Psychiatric/Behavioral: Negative for suicidal ideas, sleep disturbance and dysphoric mood. The patient is not nervous/anxious.        Objective:   Physical Exam  Constitutional: She is oriented to person, place, and time. She appears well-developed and well-nourished. No distress.  HENT:  Head: Normocephalic and atraumatic.  Right Ear: External ear normal.  Left Ear: External ear normal.  Nose: Nose normal.  Mouth/Throat: Oropharynx is clear and moist. No oropharyngeal exudate.  Eyes: Conjunctivae are normal. Pupils are equal, round, and reactive to light. Right eye exhibits no discharge. Left eye exhibits no  discharge. No scleral icterus.  Neck: Normal range of motion. Neck supple. No tracheal deviation present. No thyromegaly present.  Cardiovascular: Normal rate, regular rhythm, normal heart sounds and intact distal pulses.  Exam reveals no gallop and no friction rub.   No murmur  heard. Pulmonary/Chest: Effort normal and breath sounds normal. No accessory muscle usage. Not tachypneic. No respiratory distress. She has no decreased breath sounds. She has no wheezes. She has no rhonchi. She has no rales. She exhibits no tenderness.  Musculoskeletal: Normal range of motion. She exhibits no edema and no tenderness.  Lymphadenopathy:    She has no cervical adenopathy.  Neurological: She is alert and oriented to person, place, and time. No cranial nerve deficit. She exhibits normal muscle tone. Coordination normal.  Skin: Skin is warm and dry. No rash noted. She is not diaphoretic. No erythema. No pallor.  Psychiatric: She has a normal mood and affect. Her behavior is normal. Judgment and thought content normal.          Assessment & Plan:

## 2013-10-29 NOTE — Telephone Encounter (Signed)
Can you ask pt if she has ever been on a statin medication such as lipitor? I could not find this record in her chart.

## 2013-10-29 NOTE — Assessment & Plan Note (Signed)
Will check A1c with labs today at [facility]. Continue metformin. Follow up 3 months and prn.

## 2013-10-29 NOTE — Assessment & Plan Note (Signed)
Intermittent left lower leg pain at night. Will check electrolytes with labs. Suspect pain may be secondary to previous lumbar spine surgery. If labs are normal, and symptoms persistent, consider adding Neurontin to help control pain.

## 2013-10-29 NOTE — Assessment & Plan Note (Signed)
Wt Readings from Last 3 Encounters:  10/29/13 162 lb (73.483 kg)  07/10/13 160 lb (72.576 kg)  03/04/13 159 lb (72.122 kg)   Encouraged healthy diet and regular exercise with goal of weight loss.

## 2013-10-30 NOTE — Telephone Encounter (Signed)
States she has used Crestor and other statins she can't remember which ones, in the past but they all caused muscle and bone aches.

## 2013-12-01 ENCOUNTER — Ambulatory Visit (INDEPENDENT_AMBULATORY_CARE_PROVIDER_SITE_OTHER): Payer: 59 | Admitting: Internal Medicine

## 2013-12-01 ENCOUNTER — Encounter: Payer: Self-pay | Admitting: Internal Medicine

## 2013-12-01 ENCOUNTER — Encounter (INDEPENDENT_AMBULATORY_CARE_PROVIDER_SITE_OTHER): Payer: Self-pay

## 2013-12-01 VITALS — BP 120/74 | HR 92 | Temp 98.2°F | Wt 160.0 lb

## 2013-12-01 DIAGNOSIS — J0100 Acute maxillary sinusitis, unspecified: Secondary | ICD-10-CM | POA: Insufficient documentation

## 2013-12-01 MED ORDER — DOXYCYCLINE HYCLATE 100 MG PO TABS: 100.0000 mg | ORAL_TABLET | Freq: Two times a day (BID) | ORAL | Status: DC

## 2013-12-01 NOTE — Assessment & Plan Note (Signed)
Symptoms and exam consistent with acute maxillary sinusitis. Will start doxycycline. Will continue claritin and benadryl prn. Mucinex prn. Pt will call or return to clinic if symptoms do not improve over next few days.

## 2013-12-01 NOTE — Progress Notes (Signed)
Subjective:    Patient ID: Olivia Benton, female    DOB: [birth year], 57 y.o.   MRN: [id number]  HPI 57YO female presents for acute visit, complains of 1 week nasal congestion, purulent and bloody nasal drainage, fatigue, facial pressure and headache. Occasional productive cough. No dyspnea. Taking Mucinex with no improvement. No fever or chills. Symptoms consistent with previous episodes of sinusitis.  Outpatient Prescriptions Prior to Visit  Medication Sig Dispense Refill  . aspirin 81 MG tablet Take 81 mg by mouth daily.        Marland Kitchen desonide (DESOWEN) 0.05 % cream       . fluticasone (FLONASE) 50 MCG/ACT nasal spray Place 2 sprays into both nostrils daily.  48 g  3  . glucose blood (FREESTYLE LITE) test strip Use as directed  300 each  3  . HYDROcodone-acetaminophen (VICODIN) 5-500 MG per tablet Take 1 tablet by mouth every 6 (six) hours as needed (cough).  90 tablet  0  . losartan-hydrochlorothiazide (HYZAAR) 100-12.5 MG per tablet Take 1 tablet by mouth daily.  90 tablet  4  . metFORMIN (GLUCOPHAGE) 500 MG tablet TAKE ONE TABLET BY MOUTH ONE TIME DAILY  90 tablet  3  . montelukast (SINGULAIR) 10 MG tablet Take 1 tablet (10 mg total) by mouth at bedtime.  90 tablet  4  . omeprazole (PRILOSEC) 40 MG capsule Take 1 capsule (40 mg total) by mouth daily.  90 capsule  3  . tretinoin (RETIN-A) 0.05 % cream Apply 1 application topically at bedtime.      . vitamin C (ASCORBIC ACID) 500 MG tablet Take 500 mg by mouth daily.      . Multiple Minerals-Vitamins (CALCIUM & VIT D3 BONE HEALTH PO) Take by mouth daily.      Marland Kitchen albuterol (PROAIR HFA) 108 (90 BASE) MCG/ACT inhaler Inhale 2 puffs into the lungs every 6 (six) hours as needed for wheezing.  3.7 g  6  . fluticasone (FLOVENT HFA) 220 MCG/ACT inhaler Inhale 1 puff into the lungs 2 (two) times daily.  1 Inhaler  12   No facility-administered medications prior to visit.    Review of Systems  Constitutional: Positive for fatigue. Negative for  fever, chills and unexpected weight change.  HENT: Positive for congestion and sinus pressure. Negative for ear discharge, ear pain, facial swelling, hearing loss, mouth sores, nosebleeds, postnasal drip, rhinorrhea, sneezing, sore throat, tinnitus, trouble swallowing and voice change.   Eyes: Negative for pain, discharge, redness and visual disturbance.  Respiratory: Positive for cough. Negative for chest tightness, shortness of breath, wheezing and stridor.   Cardiovascular: Negative for chest pain, palpitations and leg swelling.  Musculoskeletal: Negative for arthralgias, myalgias, neck pain and neck stiffness.  Skin: Negative for color change and rash.  Neurological: Negative for dizziness, weakness, light-headedness and headaches.  Hematological: Negative for adenopathy.       Objective:   Physical Exam  Constitutional: She is oriented to person, place, and time. She appears well-developed and well-nourished. No distress.  HENT:  Head: Normocephalic and atraumatic.  Right Ear: External ear normal. A middle ear effusion is present.  Left Ear: External ear normal. A middle ear effusion is present.  Nose: Mucosal edema and sinus tenderness present. Right sinus exhibits maxillary sinus tenderness. Left sinus exhibits maxillary sinus tenderness.  Mouth/Throat: Oropharynx is clear and moist. No oropharyngeal exudate.  Eyes: Conjunctivae are normal. Pupils are equal, round, and reactive to light. Right eye exhibits no discharge. Left eye exhibits no  discharge. No scleral icterus.  Neck: Normal range of motion. Neck supple. No tracheal deviation present. No thyromegaly present.  Cardiovascular: Normal rate, regular rhythm, normal heart sounds and intact distal pulses.  Exam reveals no gallop and no friction rub.   No murmur heard. Pulmonary/Chest: Effort normal and breath sounds normal. No accessory muscle usage. Not tachypneic. No respiratory distress. She has no decreased breath sounds. She  has no wheezes. She has no rhonchi. She has no rales. She exhibits no tenderness.  Musculoskeletal: Normal range of motion. She exhibits no edema and no tenderness.  Lymphadenopathy:    She has no cervical adenopathy.  Neurological: She is alert and oriented to person, place, and time. No cranial nerve deficit. She exhibits normal muscle tone. Coordination normal.  Skin: Skin is warm and dry. No rash noted. She is not diaphoretic. No erythema. No pallor.  Psychiatric: She has a normal mood and affect. Her behavior is normal. Judgment and thought content normal.          Assessment & Plan:

## 2013-12-01 NOTE — Progress Notes (Signed)
Pre-visit discussion using our clinic review tool. No additional management support is needed unless otherwise documented below in the visit note.  

## 2014-01-15 ENCOUNTER — Other Ambulatory Visit: Payer: Self-pay | Admitting: Internal Medicine

## 2014-01-16 LAB — COMPREHENSIVE METABOLIC PANEL
ALT: 47 IU/L — AB (ref 0–32)
AST: 33 IU/L (ref 0–40)
Albumin/Globulin Ratio: 1.9 (ref 1.1–2.5)
Albumin: 4.8 g/dL (ref 3.5–5.5)
Alkaline Phosphatase: 75 IU/L (ref 39–117)
BUN/Creatinine Ratio: 26 — ABNORMAL HIGH (ref 9–23)
BUN: 18 mg/dL (ref 6–24)
CHLORIDE: 99 mmol/L (ref 97–108)
CO2: 24 mmol/L (ref 18–29)
Calcium: 9.6 mg/dL (ref 8.7–10.2)
Creatinine, Ser: 0.69 mg/dL (ref 0.57–1.00)
GFR calc non Af Amer: 96 mL/min/{1.73_m2} (ref >59)
GFR, EST AFRICAN AMERICAN: 111 mL/min/{1.73_m2} (ref >59)
GLUCOSE: 103 mg/dL — AB (ref 65–99)
Globulin, Total: 2.5 g/dL (ref 1.5–4.5)
POTASSIUM: 4.1 mmol/L (ref 3.5–5.2)
SODIUM: 142 mmol/L (ref 134–144)
TOTAL PROTEIN: 7.3 g/dL (ref 6.0–8.5)
Total Bilirubin: 0.3 mg/dL (ref 0.0–1.2)

## 2014-01-16 LAB — LIPID PANEL W/O CHOL/HDL RATIO
CHOLESTEROL TOTAL: 164 mg/dL (ref 100–199)
HDL: 28 mg/dL — ABNORMAL LOW (ref >39)
LDL Calculated: 105 mg/dL — ABNORMAL HIGH (ref 0–99)
Triglycerides: 154 mg/dL — ABNORMAL HIGH (ref 0–149)
VLDL CHOLESTEROL CAL: 31 mg/dL (ref 5–40)

## 2014-01-16 LAB — MICROALBUMIN / CREATININE URINE RATIO
Creatinine, Ur: 80.1 mg/dL (ref 15.0–278.0)
MICROALB/CREAT RATIO: 12.4 mg/g creat (ref 0.0–30.0)
Microalbumin, Urine: 9.9 ug/mL (ref 0.0–17.0)

## 2014-01-16 LAB — TSH: TSH: 0.633 u[IU]/mL (ref 0.450–4.500)

## 2014-01-16 LAB — VITAMIN D 25 HYDROXY (VIT D DEFICIENCY, FRACTURES): Vit D, 25-Hydroxy: 22 ng/mL — ABNORMAL LOW (ref 30.0–100.0)

## 2014-01-16 LAB — HGB A1C W/O EAG: HEMOGLOBIN A1C: 6.5 % — AB (ref 4.8–5.6)

## 2014-02-02 ENCOUNTER — Ambulatory Visit: Payer: 59 | Admitting: Internal Medicine

## 2014-02-22 ENCOUNTER — Encounter: Payer: Self-pay | Admitting: Internal Medicine

## 2014-02-22 ENCOUNTER — Ambulatory Visit (INDEPENDENT_AMBULATORY_CARE_PROVIDER_SITE_OTHER): Payer: 59 | Admitting: Internal Medicine

## 2014-02-22 VITALS — BP 116/70 | HR 70 | Temp 98.4°F | Resp 16 | Wt 167.0 lb

## 2014-02-22 DIAGNOSIS — E119 Type 2 diabetes mellitus without complications: Secondary | ICD-10-CM

## 2014-02-22 DIAGNOSIS — I10 Essential (primary) hypertension: Secondary | ICD-10-CM

## 2014-02-22 MED ORDER — GLUCOSE BLOOD VI STRP
ORAL_STRIP | Status: DC
Start: 2014-02-22 — End: 2016-01-10

## 2014-02-22 NOTE — Assessment & Plan Note (Signed)
BP Readings from Last 3 Encounters:  02/22/14 116/70  12/01/13 120/74  10/29/13 90/58   BP well controlled on Losartan-HCTZ. Will continue.

## 2014-02-22 NOTE — Progress Notes (Signed)
Subjective:    Patient ID: Olivia Benton, female    DOB: 02/05/1956, 58 y.o.   MRN: [id number]  HPI 58YO female with DM, HTN presents for follow up. She is feeling well. Recent symptoms of nasal congestion with sinus infection have resolved. BG are well controlled, typically less than 130 fasting. She is compliant with medication. No new concerns today. Planning to travel to [place] next month.    Review of Systems  Constitutional: Negative for fever, chills, appetite change, fatigue and unexpected weight change.  HENT: Negative for congestion, ear pain, sinus pressure, sore throat, trouble swallowing and voice change.   Eyes: Negative for visual disturbance.  Respiratory: Negative for cough, shortness of breath, wheezing and stridor.   Cardiovascular: Negative for chest pain, palpitations and leg swelling.  Gastrointestinal: Negative for nausea, vomiting, abdominal pain, diarrhea, constipation, blood in stool, abdominal distention and anal bleeding.  Genitourinary: Negative for dysuria and flank pain.  Musculoskeletal: Negative for arthralgias, gait problem, myalgias and neck pain.  Skin: Negative for color change and rash.  Neurological: Negative for dizziness and headaches.  Hematological: Negative for adenopathy. Does not bruise/bleed easily.  Psychiatric/Behavioral: Negative for suicidal ideas, sleep disturbance and dysphoric mood. The patient is not nervous/anxious.        Objective:    BP 116/70  Pulse 70  Temp(Src) 98.4 F (36.9 C) (Oral)  Resp 16  Wt 167 lb (75.751 kg)  SpO2 95% Physical Exam  Constitutional: She is oriented to person, place, and time. She appears well-developed and well-nourished. No distress.  HENT:  Head: Normocephalic and atraumatic.  Right Ear: External ear normal.  Left Ear: External ear normal.  Nose: Nose normal.  Mouth/Throat: Oropharynx is clear and moist. No oropharyngeal exudate.  Eyes: Conjunctivae are normal. Pupils are  equal, round, and reactive to light. Right eye exhibits no discharge. Left eye exhibits no discharge. No scleral icterus.  Neck: Normal range of motion. Neck supple. No tracheal deviation present. No thyromegaly present.  Cardiovascular: Normal rate, regular rhythm, normal heart sounds and intact distal pulses.  Exam reveals no gallop and no friction rub.   No murmur heard. Pulmonary/Chest: Effort normal and breath sounds normal. No accessory muscle usage. Not tachypneic. No respiratory distress. She has no decreased breath sounds. She has no wheezes. She has no rhonchi. She has no rales. She exhibits no tenderness.  Musculoskeletal: Normal range of motion. She exhibits no edema and no tenderness.  Lymphadenopathy:    She has no cervical adenopathy.  Neurological: She is alert and oriented to person, place, and time. No cranial nerve deficit. She exhibits normal muscle tone. Coordination normal.  Skin: Skin is warm and dry. No rash noted. She is not diaphoretic. No erythema. No pallor.  Psychiatric: She has a normal mood and affect. Her behavior is normal. Judgment and thought content normal.          Assessment & Plan:   Problem List Items Addressed This Visit   Diabetes mellitus type 2, controlled - Primary      Lab Results  Component Value Date   HGBA1C 6.5* 01/15/2014    BG well controlled per pt report. Will continue Metformin. Plan recheck A1c in late 03/2014.    Hypertension      BP Readings from Last 3 Encounters:  02/22/14 116/70  12/01/13 120/74  10/29/13 90/58   BP well controlled on Losartan-HCTZ. Will continue.        Return in about 2 months (around 04/24/2014) for  Recheck of Diabetes.

## 2014-02-22 NOTE — Progress Notes (Signed)
Pre visit review using our clinic review tool, if applicable. No additional management support is needed unless otherwise documented below in the visit note. 

## 2014-02-22 NOTE — Assessment & Plan Note (Signed)
Lab Results  Component Value Date   HGBA1C 6.5* 01/15/2014    BG well controlled per pt report. Will continue Metformin. Plan recheck A1c in late 03/2014.

## 2014-02-23 ENCOUNTER — Telehealth: Payer: Self-pay | Admitting: Internal Medicine

## 2014-02-23 NOTE — Telephone Encounter (Signed)
Relevant patient education assigned to patient using Emmi. ° °

## 2014-02-25 ENCOUNTER — Telehealth: Payer: Self-pay

## 2014-02-25 NOTE — Telephone Encounter (Signed)
Relevant patient education assigned to patient using Emmi. ° °

## 2014-03-09 ENCOUNTER — Telehealth: Payer: Self-pay | Admitting: *Deleted

## 2014-03-09 NOTE — Telephone Encounter (Signed)
Patient left a message stating she will need prior authorization on her diabetic test strips.

## 2014-03-14 NOTE — Telephone Encounter (Signed)
Could you check on this for me please? And thank you

## 2014-03-15 NOTE — Telephone Encounter (Signed)
Received PA request form for [employer] placed in Dr.walker box

## 2014-03-17 LAB — HM DIABETES EYE EXAM

## 2014-03-18 NOTE — Telephone Encounter (Signed)
Request was denied, received fax letter today. Patient should receive copy of letter in mail.

## 2014-04-21 LAB — LIPID PANEL: LDL Cholesterol: 83 mg/dL

## 2014-04-21 LAB — HEMOGLOBIN A1C: HEMOGLOBIN A1C: 6.3 % — AB (ref 4.0–6.0)

## 2014-04-26 ENCOUNTER — Telehealth: Payer: Self-pay | Admitting: Internal Medicine

## 2014-04-26 NOTE — Telephone Encounter (Signed)
Labs from 5/6 show normal kidney and liver function, Normal cholesterol with total cholesterol 138, LDL 83. A1c was 6.3%.  Thyroid function and Vit D were normal.

## 2014-04-26 NOTE — Telephone Encounter (Signed)
Called and advised patient of results,  verbalized understanding. 

## 2014-04-29 ENCOUNTER — Ambulatory Visit (INDEPENDENT_AMBULATORY_CARE_PROVIDER_SITE_OTHER): Payer: 59 | Admitting: Internal Medicine

## 2014-04-29 ENCOUNTER — Encounter: Payer: Self-pay | Admitting: Internal Medicine

## 2014-04-29 VITALS — BP 122/66 | HR 89 | Temp 98.7°F | Wt 156.5 lb

## 2014-04-29 DIAGNOSIS — I10 Essential (primary) hypertension: Secondary | ICD-10-CM

## 2014-04-29 DIAGNOSIS — G8929 Other chronic pain: Secondary | ICD-10-CM

## 2014-04-29 DIAGNOSIS — M545 Low back pain, unspecified: Secondary | ICD-10-CM

## 2014-04-29 DIAGNOSIS — E669 Obesity, unspecified: Secondary | ICD-10-CM

## 2014-04-29 DIAGNOSIS — E119 Type 2 diabetes mellitus without complications: Secondary | ICD-10-CM

## 2014-04-29 NOTE — Assessment & Plan Note (Signed)
Wt Readings from Last 3 Encounters:  04/29/14 156 lb 8 oz (70.988 kg)  02/22/14 167 lb (75.751 kg)  12/01/13 160 lb (72.576 kg)   Body mass index is 32.72 kg/(m^2). Congratulated pt on weight loss. Encouraged continued effort at healthy diet and exercise, such as water exercise.

## 2014-04-29 NOTE — Assessment & Plan Note (Signed)
BP Readings from Last 3 Encounters:  04/29/14 122/66  02/22/14 116/70  12/01/13 120/74   BP well controlled. Will continue Losartan-HCTZ.

## 2014-04-29 NOTE — Progress Notes (Signed)
Subjective:    Patient ID: Olivia Benton, female    DOB: 1956/06/25, 58 y.o.   MRN: [id number]  HPI 58YO female presents for follow up.  Back pain - Seen by [facility] last week. Having severe lower back pain. Dr. Carloyn Manner requested CT scan of lumbar spine, but waiting on insurance coverage. Taking Percocet with some improvement, but increased drowsiness. Having weakness and diminished reflexes in left leg.  DM - BG have been well controlled. Following weight watchers. A1c 6.3%. Compliant with medications. Following Weight Watchers diet with goal of weight loss. Has lost 11lbs since last visit.  Review of Systems  Constitutional: Negative for fever, chills, appetite change, fatigue and unexpected weight change.  HENT: Negative for congestion, ear pain, sinus pressure, sore throat, trouble swallowing and voice change.   Eyes: Negative for visual disturbance.  Respiratory: Negative for cough, shortness of breath, wheezing and stridor.   Cardiovascular: Negative for chest pain, palpitations and leg swelling.  Gastrointestinal: Negative for nausea, vomiting, abdominal pain, diarrhea, constipation, blood in stool, abdominal distention and anal bleeding.  Genitourinary: Negative for dysuria and flank pain.  Musculoskeletal: Positive for arthralgias, back pain and myalgias. Negative for gait problem and neck pain.  Skin: Negative for color change and rash.  Neurological: Negative for dizziness and headaches.  Hematological: Negative for adenopathy. Does not bruise/bleed easily.  Psychiatric/Behavioral: Negative for suicidal ideas, sleep disturbance and dysphoric mood. The patient is not nervous/anxious.        Objective:    BP 122/66  Pulse 89  Temp(Src) 98.7 F (37.1 C) (Oral)  Wt 156 lb 8 oz (70.988 kg)  SpO2 95% Physical Exam  Constitutional: She is oriented to person, place, and time. She appears well-developed and well-nourished. No distress.  HENT:  Head: Normocephalic and atraumatic.   Right Ear: External ear normal.  Left Ear: External ear normal.  Nose: Nose normal.  Mouth/Throat: Oropharynx is clear and moist. No oropharyngeal exudate.  Eyes: Conjunctivae are normal. Pupils are equal, round, and reactive to light. Right eye exhibits no discharge. Left eye exhibits no discharge. No scleral icterus.  Neck: Normal range of motion. Neck supple. No tracheal deviation present. No thyromegaly present.  Cardiovascular: Normal rate, regular rhythm, normal heart sounds and intact distal pulses.  Exam reveals no gallop and no friction rub.   No murmur heard. Pulmonary/Chest: Effort normal and breath sounds normal. No accessory muscle usage. Not tachypneic. No respiratory distress. She has no decreased breath sounds. She has no wheezes. She has no rhonchi. She has no rales. She exhibits no tenderness.  Musculoskeletal: She exhibits no edema.       Lumbar back: She exhibits decreased range of motion, tenderness and pain.       Back:  Lymphadenopathy:    She has no cervical adenopathy.  Neurological: She is alert and oriented to person, place, and time. No cranial nerve deficit. She exhibits normal muscle tone. Coordination normal.  Skin: Skin is warm and dry. No rash noted. She is not diaphoretic. No erythema. No pallor.  Psychiatric: She has a normal mood and affect. Her behavior is normal. Judgment and thought content normal.          Assessment & Plan:   Problem List Items Addressed This Visit   Chronic low back pain     Severe pain with left leg numbness, weakness, diminished reflexes. Continue follow up with [facility] with pending CT lumbar spine for further evaluation. Continue prn percocet for severe pain.  Relevant Medications      oxyCODONE-acetaminophen (PERCOCET/ROXICET) 5-325 MG per tablet   Diabetes mellitus type 2, controlled - Primary     Recent BG well controlled. Will continue Metformin. Encouraged continued healthy diet and exercise.    Hypertension        BP Readings from Last 3 Encounters:  04/29/14 122/66  02/22/14 116/70  12/01/13 120/74   BP well controlled. Will continue Losartan-HCTZ.    Obesity (BMI 30-39.9)      Wt Readings from Last 3 Encounters:  04/29/14 156 lb 8 oz (70.988 kg)  02/22/14 167 lb (75.751 kg)  12/01/13 160 lb (72.576 kg)   Body mass index is 32.72 kg/(m^2). Congratulated pt on weight loss. Encouraged continued effort at healthy diet and exercise, such as water exercise.        Return in about 3 months (around 07/30/2014) for Physical.

## 2014-04-29 NOTE — Assessment & Plan Note (Signed)
Severe pain with left leg numbness, weakness, diminished reflexes. Continue follow up with [facility] with pending CT lumbar spine for further evaluation. Continue prn percocet for severe pain.

## 2014-04-29 NOTE — Progress Notes (Signed)
Pre visit review using our clinic review tool, if applicable. No additional management support is needed unless otherwise documented below in the visit note. 

## 2014-04-29 NOTE — Assessment & Plan Note (Signed)
Recent BG well controlled. Will continue Metformin. Encouraged continued healthy diet and exercise.

## 2014-04-30 ENCOUNTER — Telehealth: Payer: Self-pay | Admitting: Internal Medicine

## 2014-04-30 NOTE — Telephone Encounter (Signed)
Relevant patient education assigned to patient using Emmi. ° °

## 2014-05-14 ENCOUNTER — Ambulatory Visit: Payer: Self-pay | Admitting: Neurosurgery

## 2014-05-18 ENCOUNTER — Encounter: Payer: Self-pay | Admitting: Internal Medicine

## 2014-06-24 LAB — LIPID PANEL
Cholesterol: 150 mg/dL (ref 0–200)
HDL: 27 mg/dL — AB (ref 35–70)
LDL CALC: 91 mg/dL
TRIGLYCERIDES: 160 mg/dL (ref 40–160)

## 2014-06-24 LAB — BASIC METABOLIC PANEL: GLUCOSE: 92 mg/dL

## 2014-06-24 LAB — HEMOGLOBIN A1C: HEMOGLOBIN A1C: 6.3 % — AB (ref 4.0–6.0)

## 2014-06-30 ENCOUNTER — Other Ambulatory Visit: Payer: Self-pay | Admitting: Internal Medicine

## 2014-07-15 ENCOUNTER — Encounter: Payer: Self-pay | Admitting: *Deleted

## 2014-07-30 ENCOUNTER — Ambulatory Visit (INDEPENDENT_AMBULATORY_CARE_PROVIDER_SITE_OTHER): Payer: 59 | Admitting: Internal Medicine

## 2014-07-30 ENCOUNTER — Encounter: Payer: Self-pay | Admitting: Internal Medicine

## 2014-07-30 VITALS — BP 102/58 | HR 81 | Temp 98.4°F | Ht 59.0 in | Wt 150.2 lb

## 2014-07-30 DIAGNOSIS — I10 Essential (primary) hypertension: Secondary | ICD-10-CM

## 2014-07-30 DIAGNOSIS — E669 Obesity, unspecified: Secondary | ICD-10-CM

## 2014-07-30 DIAGNOSIS — E119 Type 2 diabetes mellitus without complications: Secondary | ICD-10-CM

## 2014-07-30 DIAGNOSIS — Z0000 Encounter for general adult medical examination without abnormal findings: Secondary | ICD-10-CM | POA: Insufficient documentation

## 2014-07-30 NOTE — Assessment & Plan Note (Signed)
Wt Readings from Last 3 Encounters:  07/30/14 150 lb 4 oz (68.153 kg)  04/29/14 156 lb 8 oz (70.988 kg)  02/22/14 167 lb (75.751 kg)   Body mass index is 30.33 kg/(m^2). Encouraged healthy diet and exercise. Continue Weight Watchers.

## 2014-07-30 NOTE — Progress Notes (Signed)
Subjective:    Patient ID: Olivia Benton, female    DOB: 1956/01/13, 58 y.o.   MRN: [id number]  HPI 58YO female presents for annual exam. Feeling well. No concerns today. Working on losing weight, following weight watchers and trying to be more physically active. Recent A1c 6.3%. Compliant with medications.   Review of Systems  Constitutional: Negative for fever, chills, appetite change, fatigue and unexpected weight change.  Eyes: Negative for visual disturbance.  Respiratory: Negative for shortness of breath.   Cardiovascular: Negative for chest pain and leg swelling.  Gastrointestinal: Negative for nausea, vomiting, abdominal pain, diarrhea and constipation.  Musculoskeletal: Negative for arthralgias and myalgias.  Skin: Negative for color change and rash.  Hematological: Negative for adenopathy. Does not bruise/bleed easily.  Psychiatric/Behavioral: Negative for dysphoric mood. The patient is not nervous/anxious.        Objective:    BP 102/58  Pulse 81  Temp(Src) 98.4 F (36.9 C) (Oral)  Ht 4\' 11"  (1.499 m)  Wt 150 lb 4 oz (68.153 kg)  BMI 30.33 kg/m2  SpO2 95% Physical Exam  Constitutional: She is oriented to person, place, and time. She appears well-developed and well-nourished. No distress.  HENT:  Head: Normocephalic and atraumatic.  Right Ear: External ear normal.  Left Ear: External ear normal.  Nose: Nose normal.  Mouth/Throat: Oropharynx is clear and moist. No oropharyngeal exudate.  Eyes: Conjunctivae are normal. Pupils are equal, round, and reactive to light. Right eye exhibits no discharge. Left eye exhibits no discharge. No scleral icterus.  Neck: Normal range of motion. Neck supple. No tracheal deviation present. No thyromegaly present.  Cardiovascular: Normal rate, regular rhythm, normal heart sounds and intact distal pulses.  Exam reveals no gallop and no friction rub.   No murmur heard. Pulmonary/Chest: Effort normal and breath sounds normal.  No accessory muscle usage. Not tachypneic. No respiratory distress. She has no decreased breath sounds. She has no wheezes. She has no rales. She exhibits no tenderness. Right breast exhibits no inverted nipple, no mass, no nipple discharge, no skin change and no tenderness. Left breast exhibits no inverted nipple, no mass, no nipple discharge, no skin change and no tenderness. Breasts are symmetrical.  Abdominal: Soft. Bowel sounds are normal. She exhibits no distension and no mass. There is no tenderness. There is no rebound and no guarding.  Musculoskeletal: Normal range of motion. She exhibits no edema and no tenderness.  Lymphadenopathy:    She has no cervical adenopathy.  Neurological: She is alert and oriented to person, place, and time. No cranial nerve deficit. She exhibits normal muscle tone. Coordination normal.  Skin: Skin is warm and dry. No rash noted. She is not diaphoretic. No erythema. No pallor.  Psychiatric: She has a normal mood and affect. Her behavior is normal. Judgment and thought content normal.          Assessment & Plan:   Problem List Items Addressed This Visit     Unprioritized   Diabetes mellitus type 2, controlled      Lab Results  Component Value Date   HGBA1C 6.3* 04/21/2014   BG well controlled. Foot exam normal today. Follow up in 3 months and prn.    Hypertension   Obesity (BMI 30-39.9)      Wt Readings from Last 3 Encounters:  07/30/14 150 lb 4 oz (68.153 kg)  04/29/14 156 lb 8 oz (70.988 kg)  02/22/14 167 lb (75.751 kg)   Body mass index is 30.33 kg/(m^2). Encouraged  healthy diet and exercise. Continue Weight Watchers.     Routine general medical examination at a health care facility - Primary     General medical exam including breast exam normal today. PAP and pelvic deferred as normal PAP 2013 and pt s/p hysterectomy. Mammogram UTD and reviewed. Colonoscopy UTD. Immunizations UTD. Labs recently completed through her employer. Encouraged  healthy diet and exercise.         Return in about 3 months (around 10/30/2014) for Recheck of Diabetes.

## 2014-07-30 NOTE — Assessment & Plan Note (Addendum)
Lab Results  Component Value Date   HGBA1C 6.3* 04/21/2014   BG well controlled. Foot exam normal today. Follow up in 3 months and prn.

## 2014-07-30 NOTE — Progress Notes (Signed)
Pre visit review using our clinic review tool, if applicable. No additional management support is needed unless otherwise documented below in the visit note. 

## 2014-07-30 NOTE — Patient Instructions (Signed)

## 2014-07-30 NOTE — Assessment & Plan Note (Signed)
General medical exam including breast exam normal today. PAP and pelvic deferred as normal PAP 2013 and pt s/p hysterectomy. Mammogram UTD and reviewed. Colonoscopy UTD. Immunizations UTD. Labs recently completed through her employer. Encouraged healthy diet and exercise.

## 2014-09-06 ENCOUNTER — Encounter: Payer: Self-pay | Admitting: *Deleted

## 2014-10-19 ENCOUNTER — Ambulatory Visit: Payer: Self-pay | Admitting: Internal Medicine

## 2014-10-19 LAB — HM MAMMOGRAPHY: HM Mammogram: NEGATIVE

## 2014-10-20 ENCOUNTER — Encounter: Payer: Self-pay | Admitting: *Deleted

## 2014-11-01 ENCOUNTER — Ambulatory Visit (INDEPENDENT_AMBULATORY_CARE_PROVIDER_SITE_OTHER): Payer: 59 | Admitting: Internal Medicine

## 2014-11-01 ENCOUNTER — Telehealth: Payer: Self-pay | Admitting: Internal Medicine

## 2014-11-01 ENCOUNTER — Other Ambulatory Visit: Payer: Self-pay | Admitting: Internal Medicine

## 2014-11-01 ENCOUNTER — Encounter: Payer: Self-pay | Admitting: Internal Medicine

## 2014-11-01 VITALS — BP 102/80 | HR 82 | Temp 98.7°F | Ht 59.0 in | Wt 155.0 lb

## 2014-11-01 DIAGNOSIS — E119 Type 2 diabetes mellitus without complications: Secondary | ICD-10-CM

## 2014-11-01 DIAGNOSIS — J209 Acute bronchitis, unspecified: Secondary | ICD-10-CM

## 2014-11-01 DIAGNOSIS — I10 Essential (primary) hypertension: Secondary | ICD-10-CM

## 2014-11-01 MED ORDER — ALBUTEROL SULFATE HFA 108 (90 BASE) MCG/ACT IN AERS: 2.0000 | INHALATION_SPRAY | Freq: Four times a day (QID) | RESPIRATORY_TRACT | Status: DC | PRN

## 2014-11-01 MED ORDER — HYDROCODONE-HOMATROPINE 5-1.5 MG/5ML PO SYRP
5.0000 mL | ORAL_SOLUTION | Freq: Three times a day (TID) | ORAL | Status: DC | PRN
Start: 2014-11-01 — End: 2015-02-18

## 2014-11-01 MED ORDER — LEVOFLOXACIN 500 MG PO TABS: 500.0000 mg | ORAL_TABLET | Freq: Every day | ORAL | Status: DC

## 2014-11-01 NOTE — Assessment & Plan Note (Signed)
BP Readings from Last 3 Encounters:  11/01/14 102/80  07/30/14 102/58  04/29/14 122/66   BP well controlled. Continue current meds. Check renal function with labs.

## 2014-11-01 NOTE — Assessment & Plan Note (Signed)
Symptoms most consistent with acute bronchitis. Will start Levaquin. Recommended to start Prednisone, however she declines for now. She will call if symptoms are not improving. Continue Albuterol prn and prn Hydrocodone for cough. Follow up in 2 weeks.

## 2014-11-01 NOTE — Patient Instructions (Signed)
Start Levaquin 500mg  daily.  Start Hydrocodone syrup for cough.  Rest, increase fluid intake.  Follow up recheck 2 weeks or sooner as needed.  Labs through [place].

## 2014-11-01 NOTE — Assessment & Plan Note (Signed)
BG well controlled per pt. Will check A1c with labs.

## 2014-11-01 NOTE — Progress Notes (Signed)
Subjective:    Patient ID: Olivia Benton, female    DOB: September 17, 1956, 58 y.o.   MRN: [id number]  HPI 58YO female presents for follow up.  Cough - Feeling sick since Tuesday of last week. No fever, chills. Cough non-productive. Wheezing and feels short of breath at times.. Not taking anything except Zyrtec D. Using Vicodin for cough with some improvement.   DM - BG running near 110. Compliant with meds.  Review of Systems  Constitutional: Positive for fatigue. Negative for fever, chills and unexpected weight change.  HENT: Positive for congestion, postnasal drip, rhinorrhea and sore throat. Negative for ear discharge, ear pain, facial swelling, hearing loss, mouth sores, nosebleeds, sinus pressure, sneezing, tinnitus, trouble swallowing and voice change.   Eyes: Negative for pain, discharge, redness and visual disturbance.  Respiratory: Positive for cough, shortness of breath and wheezing. Negative for chest tightness and stridor.   Cardiovascular: Negative for chest pain, palpitations and leg swelling.  Musculoskeletal: Negative for myalgias, arthralgias, neck pain and neck stiffness.  Skin: Negative for color change and rash.  Neurological: Negative for dizziness, weakness, light-headedness and headaches.  Hematological: Negative for adenopathy.  Psychiatric/Behavioral: Positive for sleep disturbance. Negative for decreased concentration. The patient is not nervous/anxious.        Objective:    BP 102/80 mmHg  Pulse 82  Temp(Src) 98.7 F (37.1 C) (Oral)  Ht 4\' 11"  (1.499 m)  Wt 155 lb (70.308 kg)  BMI 31.29 kg/m2  SpO2 96% Physical Exam  Constitutional: She is oriented to person, place, and time. She appears well-developed and well-nourished. No distress.  HENT:  Head: Normocephalic and atraumatic.  Right Ear: External ear normal.  Left Ear: External ear normal.  Nose: Nose normal.  Mouth/Throat: Oropharynx is clear and moist. No oropharyngeal exudate.  Eyes:  Conjunctivae are normal. Pupils are equal, round, and reactive to light. Right eye exhibits no discharge. Left eye exhibits no discharge. No scleral icterus.  Neck: Normal range of motion. Neck supple. No tracheal deviation present. No thyromegaly present.  Cardiovascular: Normal rate, regular rhythm, normal heart sounds and intact distal pulses.  Exam reveals no gallop and no friction rub.   No murmur heard. Pulmonary/Chest: Effort normal. No accessory muscle usage. No tachypnea. No respiratory distress. She has no decreased breath sounds. She has wheezes. She has rhonchi (scattered throughout). She has no rales. She exhibits no tenderness.  Musculoskeletal: Normal range of motion. She exhibits no edema or tenderness.  Lymphadenopathy:    She has no cervical adenopathy.  Neurological: She is alert and oriented to person, place, and time. No cranial nerve deficit. She exhibits normal muscle tone. Coordination normal.  Skin: Skin is warm and dry. No rash noted. She is not diaphoretic. No erythema. No pallor.  Psychiatric: She has a normal mood and affect. Her behavior is normal. Judgment and thought content normal.          Assessment & Plan:   Problem List Items Addressed This Visit      Unprioritized   Acute bronchitis - Primary    Symptoms most consistent with acute bronchitis. Will start Levaquin. Recommended to start Prednisone, however she declines for now. She will call if symptoms are not improving. Continue Albuterol prn and prn Hydrocodone for cough. Follow up in 2 weeks.    Relevant Medications      levofloxacin (LEVAQUIN) tablet      HYDROCODONE-HOMATROPINE 5-1.5 MG/5ML PO SYRP   Diabetes mellitus type 2, controlled    BG  well controlled per pt. Will check A1c with labs.    Hypertension    BP Readings from Last 3 Encounters:  11/01/14 102/80  07/30/14 102/58  04/29/14 122/66   BP well controlled. Continue current meds. Check renal function with labs.         Return in about 2 weeks (around 11/15/2014) for Recheck.

## 2014-11-01 NOTE — Telephone Encounter (Signed)
Pt needs 30 min follow up in 2 weeks or sooner please advise appointment date and time Thanks  Robin

## 2014-11-01 NOTE — Progress Notes (Signed)
Pre visit review using our clinic review tool, if applicable. No additional management support is needed unless otherwise documented below in the visit note. 

## 2014-11-02 NOTE — Telephone Encounter (Signed)
Please advise 

## 2014-11-02 NOTE — Telephone Encounter (Signed)
Tuesday at 4 pm 

## 2014-11-03 NOTE — Telephone Encounter (Signed)
Pt notified and scheduled!  Thanks!

## 2014-11-03 NOTE — Telephone Encounter (Signed)
Would you please call and schedule? Thanks!

## 2014-11-05 LAB — HEPATIC FUNCTION PANEL
ALT: 24 U/L (ref 7–35)
AST: 21 U/L (ref 13–35)
Alkaline Phosphatase: 70 U/L (ref 25–125)
Bilirubin, Total: 0.3 mg/dL

## 2014-11-05 LAB — CBC AND DIFFERENTIAL
HEMATOCRIT: 36 % (ref 36–46)
Hemoglobin: 12.2 g/dL (ref 12.0–16.0)
Neutrophils Absolute: 4 /uL
Platelets: 268 10*3/uL (ref 150–399)
WBC: 6.7 10*3/mL

## 2014-11-05 LAB — BASIC METABOLIC PANEL
BUN: 15 mg/dL (ref 4–21)
Creatinine: 0.7 mg/dL (ref 0.5–1.1)
GLUCOSE: 93 mg/dL
POTASSIUM: 4.2 mmol/L (ref 3.4–5.3)
Sodium: 141 mmol/L (ref 137–147)

## 2014-11-05 LAB — LIPID PANEL
Cholesterol: 161 mg/dL (ref 0–200)
HDL: 27 mg/dL — AB (ref 35–70)
LDL Cholesterol: 107 mg/dL
Triglycerides: 136 mg/dL (ref 40–160)

## 2014-11-05 LAB — TSH: TSH: 1.01 u[IU]/mL (ref 0.41–5.90)

## 2014-11-05 LAB — HEMOGLOBIN A1C: Hgb A1c MFr Bld: 6.3 % — AB (ref 4.0–6.0)

## 2014-11-09 ENCOUNTER — Ambulatory Visit: Payer: 59 | Admitting: Internal Medicine

## 2014-11-18 ENCOUNTER — Encounter: Payer: Self-pay | Admitting: Internal Medicine

## 2014-11-19 ENCOUNTER — Encounter: Payer: Self-pay | Admitting: Internal Medicine

## 2014-11-19 ENCOUNTER — Ambulatory Visit (INDEPENDENT_AMBULATORY_CARE_PROVIDER_SITE_OTHER): Payer: 59 | Admitting: Internal Medicine

## 2014-11-19 ENCOUNTER — Encounter: Payer: Self-pay | Admitting: *Deleted

## 2014-11-19 VITALS — BP 143/83 | HR 69 | Temp 98.1°F | Ht 59.0 in | Wt 159.8 lb

## 2014-11-19 DIAGNOSIS — I10 Essential (primary) hypertension: Secondary | ICD-10-CM

## 2014-11-19 DIAGNOSIS — E119 Type 2 diabetes mellitus without complications: Secondary | ICD-10-CM

## 2014-11-19 DIAGNOSIS — E669 Obesity, unspecified: Secondary | ICD-10-CM

## 2014-11-19 NOTE — Assessment & Plan Note (Signed)
Wt Readings from Last 3 Encounters:  11/19/14 159 lb 12 oz (72.462 kg)  11/01/14 155 lb (70.308 kg)  07/30/14 150 lb 4 oz (68.153 kg)   The patient is asked to make an attempt to improve diet and exercise patterns to aid in medical management of this problem.

## 2014-11-19 NOTE — Progress Notes (Deleted)
Subjective:    Patient ID: Olivia Benton, female    DOB: 1956-03-12, 58 y.o.   MRN: [id number]  HPI  58YO female presents for follow up.  DM - BG well controlled A1c 6.3%. Following healthier diet.  Having mild bilateral knee pain. Taking Aleve with some improvement.  Wt Readings from Last 3 Encounters:  11/19/14 159 lb 12 oz (72.462 kg)  11/01/14 155 lb (70.308 kg)  07/30/14 150 lb 4 oz (68.153 kg)    Past medical, surgical, family and social history per today's encounter.  Review of Systems  Constitutional: Negative for fever, chills, appetite change, fatigue and unexpected weight change.  Eyes: Negative for visual disturbance.  Respiratory: Negative for shortness of breath.   Cardiovascular: Negative for chest pain and leg swelling.  Gastrointestinal: Negative for abdominal pain, diarrhea and constipation.  Musculoskeletal: Positive for myalgias and arthralgias.  Skin: Negative for color change and rash.  Hematological: Negative for adenopathy. Does not bruise/bleed easily.  Psychiatric/Behavioral: Negative for dysphoric mood. The patient is not nervous/anxious.        Objective:    BP 143/83 mmHg  Pulse 69  Temp(Src) 98.1 F (36.7 C) (Oral)  Ht 4\' 11"  (1.499 m)  Wt 159 lb 12 oz (72.462 kg)  BMI 32.25 kg/m2  SpO2 100% Physical Exam  Constitutional: She is oriented to person, place, and time. She appears well-developed and well-nourished. No distress.  HENT:  Head: Normocephalic and atraumatic.  Right Ear: External ear normal.  Left Ear: External ear normal.  Nose: Nose normal.  Mouth/Throat: Oropharynx is clear and moist. No oropharyngeal exudate.  Eyes: Conjunctivae are normal. Pupils are equal, round, and reactive to light. Right eye exhibits no discharge. Left eye exhibits no discharge. No scleral icterus.  Neck: Normal range of motion. Neck supple. No tracheal deviation present. No thyromegaly present.  Cardiovascular: Normal rate, regular rhythm,  normal heart sounds and intact distal pulses.  Exam reveals no gallop and no friction rub.   No murmur heard. Pulmonary/Chest: Effort normal and breath sounds normal. No accessory muscle usage. No tachypnea. No respiratory distress. She has no decreased breath sounds. She has no wheezes. She has no rhonchi. She has no rales. She exhibits no tenderness.  Musculoskeletal: Normal range of motion. She exhibits no edema or tenderness.  Lymphadenopathy:    She has no cervical adenopathy.  Neurological: She is alert and oriented to person, place, and time. No cranial nerve deficit. She exhibits normal muscle tone. Coordination normal.  Skin: Skin is warm and dry. No rash noted. She is not diaphoretic. No erythema. No pallor.  Psychiatric: She has a normal mood and affect. Her behavior is normal. Judgment and thought content normal.          Assessment & Plan:   Problem List Items Addressed This Visit      Unprioritized   Diabetes mellitus type 2, controlled    A1c well controlled. Continue current medications.    Hypertension - Primary    BP Readings from Last 3 Encounters:  11/19/14 143/83  11/01/14 102/80  07/30/14 102/58   BP elevated today, however have generally been well controlled. Continue current medications. Recent renal function normal.     Obesity (BMI 30-39.9)    Wt Readings from Last 3 Encounters:  11/19/14 159 lb 12 oz (72.462 kg)  11/01/14 155 lb (70.308 kg)  07/30/14 150 lb 4 oz (68.153 kg)   The patient is asked to make an attempt to improve diet and  exercise patterns to aid in medical management of this problem.         Return in about 3 months (around 02/18/2015) for Recheck of Diabetes.

## 2014-11-19 NOTE — Assessment & Plan Note (Signed)
A1c well-controlled. Continue current medications. 

## 2014-11-19 NOTE — Progress Notes (Signed)
Pre visit review using our clinic review tool, if applicable. No additional management support is needed unless otherwise documented below in the visit note. 

## 2014-11-19 NOTE — Patient Instructions (Signed)
Follow up 3 months

## 2014-11-19 NOTE — Progress Notes (Deleted)
   Subjective:    Patient ID: Olivia Benton, female    DOB: [birth year], 58 y.o.   MRN: [id number]  HPI    Past medical, surgical, family and social history per today's encounter.  Review of Systems     Objective:    BP 143/83 mmHg  Pulse 69  Temp(Src) 98.1 F (36.7 C) (Oral)  Ht 4\' 11"  (1.499 m)  Wt 159 lb 12 oz (72.462 kg)  BMI 32.25 kg/m2  SpO2 100% Physical Exam        Assessment & Plan:   Problem List Items Addressed This Visit    None       No Follow-up on file.

## 2014-11-19 NOTE — Assessment & Plan Note (Signed)
BP Readings from Last 3 Encounters:  11/19/14 143/83  11/01/14 102/80  07/30/14 102/58   BP elevated today, however have generally been well controlled. Continue current medications. Recent renal function normal.

## 2014-11-30 ENCOUNTER — Encounter: Payer: Self-pay | Admitting: Internal Medicine

## 2015-01-28 ENCOUNTER — Other Ambulatory Visit: Payer: Self-pay | Admitting: Internal Medicine

## 2015-02-18 ENCOUNTER — Ambulatory Visit (INDEPENDENT_AMBULATORY_CARE_PROVIDER_SITE_OTHER): Payer: 59 | Admitting: Internal Medicine

## 2015-02-18 ENCOUNTER — Encounter: Payer: Self-pay | Admitting: Internal Medicine

## 2015-02-18 VITALS — BP 102/67 | HR 82 | Temp 98.0°F | Ht 59.0 in | Wt 159.2 lb

## 2015-02-18 DIAGNOSIS — E669 Obesity, unspecified: Secondary | ICD-10-CM

## 2015-02-18 DIAGNOSIS — K068 Other specified disorders of gingiva and edentulous alveolar ridge: Secondary | ICD-10-CM

## 2015-02-18 DIAGNOSIS — K055 Other periodontal diseases: Secondary | ICD-10-CM

## 2015-02-18 DIAGNOSIS — E119 Type 2 diabetes mellitus without complications: Secondary | ICD-10-CM

## 2015-02-18 DIAGNOSIS — I10 Essential (primary) hypertension: Secondary | ICD-10-CM

## 2015-02-18 NOTE — Progress Notes (Signed)
Pre visit review using our clinic review tool, if applicable. No additional management support is needed unless otherwise documented below in the visit note. 

## 2015-02-18 NOTE — Progress Notes (Signed)
Subjective:    Patient ID: Olivia Benton, female    DOB: 1956-06-25, 60 y.o.   MRN: [id number]  HPI  [name] presents for follow up.  DM - BG have been well controlled. Compliant with medication.  Obesity - Trying to follow healthy diet and lose weight. Previously on Weight Watchers. Found this restrictive. Started taking supplement, Cambogia. Not following any exercise program.  HTN - Compliant with meds. No CP, headache, palpitations.  Concerned about more frequent bleeding of gums when she brushes her teeth. Plans to see dentist next week.  Wt Readings from Last 3 Encounters:  02/18/15 159 lb 4 oz (72.235 kg)  11/19/14 159 lb 12 oz (72.462 kg)  11/01/14 155 lb (70.308 kg)     Past medical, surgical, family and social history per today's encounter.  Review of Systems  Constitutional: Negative for fever, chills, appetite change, fatigue and unexpected weight change.  Eyes: Negative for visual disturbance.  Respiratory: Negative for shortness of breath.   Cardiovascular: Negative for chest pain and leg swelling.  Gastrointestinal: Negative for nausea, vomiting, abdominal pain, diarrhea and constipation.  Musculoskeletal: Negative for myalgias and arthralgias.  Skin: Negative for color change and rash.  Hematological: Negative for adenopathy. Does not bruise/bleed easily.  Psychiatric/Behavioral: Negative for sleep disturbance and dysphoric mood. The patient is not nervous/anxious.        Objective:    BP 102/67 mmHg  Pulse 82  Temp(Src) 98 F (36.7 C) (Oral)  Ht 4\' 11"  (1.499 m)  Wt 159 lb 4 oz (72.235 kg)  BMI 32.15 kg/m2  SpO2 97% Physical Exam  Constitutional: She is oriented to person, place, and time. She appears well-developed and well-nourished. No distress.  HENT:  Head: Normocephalic and atraumatic.  Right Ear: External ear normal.  Left Ear: External ear normal.  Nose: Nose normal.  Mouth/Throat: Oropharynx is clear and moist. No  oropharyngeal exudate.  Eyes: Conjunctivae are normal. Pupils are equal, round, and reactive to light. Right eye exhibits no discharge. Left eye exhibits no discharge. No scleral icterus.  Neck: Normal range of motion. Neck supple. No tracheal deviation present. No thyromegaly present.  Cardiovascular: Normal rate, regular rhythm, normal heart sounds and intact distal pulses.  Exam reveals no gallop and no friction rub.   No murmur heard. Pulmonary/Chest: Effort normal and breath sounds normal. No respiratory distress. She has no wheezes. She has no rales. She exhibits no tenderness.  Musculoskeletal: Normal range of motion. She exhibits no edema or tenderness.  Lymphadenopathy:    She has no cervical adenopathy.  Neurological: She is alert and oriented to person, place, and time. No cranial nerve deficit. She exhibits normal muscle tone. Coordination normal.  Skin: Skin is warm and dry. No rash noted. She is not diaphoretic. No erythema. No pallor.  Psychiatric: She has a normal mood and affect. Her behavior is normal. Judgment and thought content normal.          Assessment & Plan:   Problem List Items Addressed This Visit      Unprioritized   Bleeding gums    Recent increased episodes of gum bleeding. Will check CBC with labs.      Diabetes mellitus type 2, controlled - Primary    Will check A1c with labs. Continue current medication. Follow up in 3 months.      Hypertension    BP Readings from Last 3 Encounters:  02/18/15 102/67  11/19/14 143/83  11/01/14 102/80   BP well controlled.  Renal function with labs. Continue current meds.      Obesity (BMI 30-39.9)    Wt Readings from Last 3 Encounters:  02/18/15 159 lb 4 oz (72.235 kg)  11/19/14 159 lb 12 oz (72.462 kg)  11/01/14 155 lb (70.308 kg)   Body mass index is 32.15 kg/(m^2). Encouraged healthy diet and exercise. Recommended limiting carbohydrate intake to 35gm with meals and 15gm with snacks. Follow up in 3  months.          Return in about 3 months (around 05/21/2015) for Recheck of Diabetes.

## 2015-02-18 NOTE — Assessment & Plan Note (Signed)
Wt Readings from Last 3 Encounters:  02/18/15 159 lb 4 oz (72.235 kg)  11/19/14 159 lb 12 oz (72.462 kg)  11/01/14 155 lb (70.308 kg)   Body mass index is 32.15 kg/(m^2). Encouraged healthy diet and exercise. Recommended limiting carbohydrate intake to 35gm with meals and 15gm with snacks. Follow up in 3 months.

## 2015-02-18 NOTE — Assessment & Plan Note (Signed)
BP Readings from Last 3 Encounters:  02/18/15 102/67  11/19/14 143/83  11/01/14 102/80   BP well controlled. Renal function with labs. Continue current meds.

## 2015-02-18 NOTE — Patient Instructions (Addendum)
Labs at [facility].  Follow up in 3 months.

## 2015-02-18 NOTE — Assessment & Plan Note (Signed)
Will check A1c with labs. Continue current medication. Follow up in 3 months.

## 2015-02-18 NOTE — Assessment & Plan Note (Signed)
Recent increased episodes of gum bleeding. Will check CBC with labs.

## 2015-03-01 LAB — LIPID PANEL
CHOLESTEROL: 165 mg/dL (ref 0–200)
HDL: 26 mg/dL — AB (ref 35–70)
LDL CALC: 102 mg/dL
Triglycerides: 185 mg/dL — AB (ref 40–160)

## 2015-03-01 LAB — TSH: TSH: 0.75 u[IU]/mL (ref 0.41–5.90)

## 2015-03-01 LAB — HEMOGLOBIN A1C: Hgb A1c MFr Bld: 6.2 % — AB (ref 4.0–6.0)

## 2015-03-01 LAB — HEPATIC FUNCTION PANEL
ALT: 30 U/L (ref 7–35)
AST: 21 U/L (ref 13–35)
Alkaline Phosphatase: 63 U/L (ref 25–125)
Bilirubin, Total: 0.3 mg/dL

## 2015-03-01 LAB — BASIC METABOLIC PANEL
BUN: 15 mg/dL (ref 4–21)
Creatinine: 0.7 mg/dL (ref 0.5–1.1)
GLUCOSE: 100 mg/dL
Potassium: 4.1 mmol/L (ref 3.4–5.3)
SODIUM: 142 mmol/L (ref 137–147)

## 2015-03-01 LAB — CBC AND DIFFERENTIAL
HCT: 36 % (ref 36–46)
Hemoglobin: 12 g/dL (ref 12.0–16.0)
Neutrophils Absolute: 3 /uL
Platelets: 248 10*3/uL (ref 150–399)
WBC: 6.3 10^3/mL

## 2015-03-03 ENCOUNTER — Telehealth: Payer: Self-pay | Admitting: Internal Medicine

## 2015-03-03 NOTE — Telephone Encounter (Signed)
Labs from [facility] were normal with normal blood counts, normal kidney and liver function, normal cholesterol, normal thyroid function. A1c was 6.2% which is excellent. Vit D was slightly low at 27. I would recommend starting Vit D 2000units daily which is OTC.

## 2015-03-04 NOTE — Telephone Encounter (Signed)
Notified pt. 

## 2015-03-04 NOTE — Telephone Encounter (Signed)
Left vm for pt to return my call.  

## 2015-03-22 ENCOUNTER — Encounter: Payer: Self-pay | Admitting: Internal Medicine

## 2015-04-08 NOTE — Op Note (Signed)
PATIENT NAME:  Olivia Benton, Olivia Benton MR#:  [id number] DATE OF BIRTH:  May 11, 1956  DATE OF PROCEDURE:  04/28/2013  PREOPERATIVE DIAGNOSIS: Screening colonoscopy.   POSTOPERATIVE DIAGNOSIS: Diverticulosis.   PROCEDURE: Colonoscopy.   SURGEON: Rochel Brome, M.D.   ANESTHESIA: Intravenous sedation with monitored anesthesia care.   INDICATIONS: This 59 year old female requested screening colonoscopy.   DESCRIPTION OF PROCEDURE:  The patient was placed on the stretcher while in the left lateral decubitus position, was monitored with pulse oximetry, intermittent blood pressure recordings and cardiac monitor, given oxygen via nasal cannula at a rate of 2 L/min. She was sedated by the anesthesia staff.   The Olympus video colonoscope was inserted through the anal canal up into the rectum and manipulated around to the cecum which was identified by the ileocecal valve and the appendiceal orifice. Colon preparation was good. A small amount of bilious feculent material was aspirated. The scope was gradually pulled back, viewing the colonic mucosa, seeing no polyps or tumors. There was a moderate amount of diverticulosis of the sigmoid colon. The rectum appeared normal. The scope was retroflexed to view the distal rectum. Several internal hemorrhoidal papillae were identified but no polyps. The scope was then straightened and suctioned and removed.   Digital anorectal exam demonstrated no palpable mass. The patient tolerated the procedure satisfactorily and was then prepared for transfer to the recovery room.    ____________________________ Lenna Sciara. Rochel Brome, MD jws:cs D: 04/28/2013 14:10:43 ET T: 04/28/2013 14:32:13 ET JOB#: [id number]  cc: Loreli Dollar, MD, <Dictator> Loreli Dollar MD ELECTRONICALLY SIGNED 04/29/2013 17:53

## 2015-04-19 ENCOUNTER — Encounter: Payer: Self-pay | Admitting: Internal Medicine

## 2015-04-27 ENCOUNTER — Other Ambulatory Visit: Payer: Self-pay | Admitting: Internal Medicine

## 2015-04-27 ENCOUNTER — Other Ambulatory Visit: Payer: Self-pay | Admitting: *Deleted

## 2015-04-27 MED ORDER — METFORMIN HCL 500 MG PO TABS: 500.0000 mg | ORAL_TABLET | Freq: Every day | ORAL | Status: DC

## 2015-05-23 ENCOUNTER — Telehealth: Payer: Self-pay | Admitting: Internal Medicine

## 2015-05-23 ENCOUNTER — Ambulatory Visit (INDEPENDENT_AMBULATORY_CARE_PROVIDER_SITE_OTHER): Payer: 59 | Admitting: Internal Medicine

## 2015-05-23 ENCOUNTER — Ambulatory Visit
Admission: RE | Admit: 2015-05-23 | Discharge: 2015-05-23 | Disposition: A | Discharge status: Home / Self Care | Payer: 59 | Source: Ambulatory Visit | Attending: Internal Medicine | Admitting: Internal Medicine

## 2015-05-23 ENCOUNTER — Encounter: Payer: Self-pay | Admitting: Internal Medicine

## 2015-05-23 VITALS — BP 114/73 | HR 66 | Temp 98.2°F | Ht 59.0 in | Wt 162.4 lb

## 2015-05-23 DIAGNOSIS — I10 Essential (primary) hypertension: Secondary | ICD-10-CM

## 2015-05-23 DIAGNOSIS — K760 Fatty (change of) liver, not elsewhere classified: Secondary | ICD-10-CM | POA: Diagnosis not present

## 2015-05-23 DIAGNOSIS — K5730 Diverticulosis of large intestine without perforation or abscess without bleeding: Secondary | ICD-10-CM | POA: Insufficient documentation

## 2015-05-23 DIAGNOSIS — E669 Obesity, unspecified: Secondary | ICD-10-CM

## 2015-05-23 DIAGNOSIS — E119 Type 2 diabetes mellitus without complications: Secondary | ICD-10-CM

## 2015-05-23 DIAGNOSIS — R1032 Left lower quadrant pain: Secondary | ICD-10-CM

## 2015-05-23 DIAGNOSIS — R160 Hepatomegaly, not elsewhere classified: Secondary | ICD-10-CM | POA: Insufficient documentation

## 2015-05-23 DIAGNOSIS — R1030 Lower abdominal pain, unspecified: Secondary | ICD-10-CM | POA: Insufficient documentation

## 2015-05-23 HISTORY — DX: Essential (primary) hypertension: I10

## 2015-05-23 LAB — POCT URINALYSIS DIPSTICK
Bilirubin, UA: NEGATIVE
Glucose, UA: NEGATIVE
Ketones, UA: NEGATIVE
LEUKOCYTES UA: NEGATIVE
NITRITE UA: NEGATIVE
PH UA: 6
Protein, UA: NEGATIVE
RBC UA: NEGATIVE
SPEC GRAV UA: 1.01
UROBILINOGEN UA: 0.2

## 2015-05-23 MED ORDER — IOHEXOL 240 MG/ML SOLN
25.0000 mL | INTRAMUSCULAR | Status: AC
  Administered 2015-05-23 (×2): 25 mL via ORAL

## 2015-05-23 MED ORDER — IOHEXOL 300 MG/ML  SOLN
100.0000 mL | Freq: Once | INTRAMUSCULAR | Status: AC | PRN
  Administered 2015-05-23: 100 mL via INTRAVENOUS

## 2015-05-23 NOTE — Assessment & Plan Note (Signed)
Symptoms and exam concerning for diverticulitis. Will set stat CT abdomen for further evaluation. Clear liquid diet. Discussed adding Zofran for nausea, but will hold off for now.

## 2015-05-23 NOTE — Progress Notes (Signed)
Pre visit review using our clinic review tool, if applicable. No additional management support is needed unless otherwise documented below in the visit note. 

## 2015-05-23 NOTE — Telephone Encounter (Signed)
Phone call for CT report, no acute abnormalities; I told the patient to call Dr. Gilford Rile  in the morning for further advice otherwise ER tonight  if severe symptoms, fever or chills

## 2015-05-23 NOTE — Assessment & Plan Note (Signed)
Plan to recheck labs including A1c at pt work. Follow up in 2 months.

## 2015-05-23 NOTE — Assessment & Plan Note (Signed)
Wt Readings from Last 3 Encounters:  05/23/15 162 lb 6 oz (73.653 kg)  02/18/15 159 lb 4 oz (72.235 kg)  11/19/14 159 lb 12 oz (72.462 kg)   Body mass index is 32.78 kg/(m^2). The patient is asked to make an attempt to improve diet and exercise patterns to aid in medical management of this problem.

## 2015-05-23 NOTE — Progress Notes (Signed)
Subjective:    Patient ID: Olivia Benton, female    DOB: 03/12/1956, 59 y.o.   MRN: [id number]  HPI  [name] presents for follow up.  DM - BG well controlled. Typically, near 114.  Left sided abdominal pain- comes and goes, over last 3 weeks. Mild nausea but no vomiting. Symptoms improved with Dramamine. No diarrhea. Taking Probiotic with no improvement. No fever. No blood in stool. History of Colonoscopy showing diverticulosis.  Wt Readings from Last 3 Encounters:  05/23/15 162 lb 6 oz (73.653 kg)  02/18/15 159 lb 4 oz (72.235 kg)  11/19/14 159 lb 12 oz (72.462 kg)     Past medical, surgical, family and social history per today's encounter.  Review of Systems  Constitutional: Negative for fever, chills, appetite change, fatigue and unexpected weight change.  Eyes: Negative for visual disturbance.  Respiratory: Negative for shortness of breath.   Cardiovascular: Negative for chest pain and leg swelling.  Gastrointestinal: Positive for nausea and abdominal pain. Negative for vomiting, diarrhea and constipation.  Skin: Negative for color change and rash.  Hematological: Negative for adenopathy. Does not bruise/bleed easily.  Psychiatric/Behavioral: Negative for dysphoric mood. The patient is not nervous/anxious.        Objective:    BP 114/73 mmHg  Pulse 66  Temp(Src) 98.2 F (36.8 C) (Oral)  Ht 4\' 11"  (1.499 m)  Wt 162 lb 6 oz (73.653 kg)  BMI 32.78 kg/m2  SpO2 97% Physical Exam  Constitutional: She is oriented to person, place, and time. She appears well-developed and well-nourished. No distress.  HENT:  Head: Normocephalic and atraumatic.  Right Ear: External ear normal.  Left Ear: External ear normal.  Nose: Nose normal.  Mouth/Throat: Oropharynx is clear and moist. No oropharyngeal exudate.  Eyes: Conjunctivae and EOM are normal. Pupils are equal, round, and reactive to light. Right eye exhibits no discharge.  Neck: Normal range of motion. Neck  supple. No thyromegaly present.  Cardiovascular: Normal rate, regular rhythm, normal heart sounds and intact distal pulses.  Exam reveals no gallop and no friction rub.   No murmur heard. Pulmonary/Chest: Effort normal. No respiratory distress. She has no wheezes. She has no rales.  Abdominal: Soft. Bowel sounds are normal. She exhibits no distension and no mass. There is tenderness (left lower abdominal pain). There is no rebound and no guarding.  Musculoskeletal: Normal range of motion. She exhibits no edema or tenderness.  Lymphadenopathy:    She has no cervical adenopathy.  Neurological: She is alert and oriented to person, place, and time. No cranial nerve deficit. Coordination normal.  Skin: Skin is warm and dry. No rash noted. She is not diaphoretic. No erythema. No pallor.  Psychiatric: She has a normal mood and affect. Her behavior is normal. Judgment and thought content normal.          Assessment & Plan:   Problem List Items Addressed This Visit      Unprioritized   Abdominal pain, left lower quadrant - Primary    Symptoms and exam concerning for diverticulitis. Will set stat CT abdomen for further evaluation. Clear liquid diet. Discussed adding Zofran for nausea, but will hold off for now.      Relevant Orders   CT Abdomen Pelvis W Contrast   POCT Urinalysis Dipstick (Completed)   Diabetes mellitus type 2, controlled    Plan to recheck labs including A1c at pt work. Follow up in 2 months.      Hypertension    BP  Readings from Last 3 Encounters:  05/23/15 114/73  02/18/15 102/67  11/19/14 143/83   BP well controlled. Continue current medications.      Obesity (BMI 30-39.9)    Wt Readings from Last 3 Encounters:  05/23/15 162 lb 6 oz (73.653 kg)  02/18/15 159 lb 4 oz (72.235 kg)  11/19/14 159 lb 12 oz (72.462 kg)   Body mass index is 32.78 kg/(m^2). The patient is asked to make an attempt to improve diet and exercise patterns to aid in medical management of  this problem.           Return in about 2 months (around 07/23/2015).

## 2015-05-23 NOTE — Patient Instructions (Addendum)
CT abdomen today to evaluate for diverticulitis.  Labs to check blood sugars before next visit.

## 2015-05-23 NOTE — Assessment & Plan Note (Signed)
BP Readings from Last 3 Encounters:  05/23/15 114/73  02/18/15 102/67  11/19/14 143/83   BP well controlled. Continue current medications.

## 2015-05-24 ENCOUNTER — Telehealth: Payer: Self-pay | Admitting: *Deleted

## 2015-05-24 NOTE — Telephone Encounter (Signed)
Pt called requesting CT results.  Called pt, read her the result note from Dr Gilford Rile as pt says she can not access her [facility].  Pt verbalized understanding

## 2015-07-25 ENCOUNTER — Ambulatory Visit (INDEPENDENT_AMBULATORY_CARE_PROVIDER_SITE_OTHER): Payer: 59 | Admitting: Internal Medicine

## 2015-07-25 ENCOUNTER — Encounter: Payer: Self-pay | Admitting: Internal Medicine

## 2015-07-25 VITALS — BP 108/58 | HR 80 | Temp 98.1°F | Ht <= 58 in | Wt 160.0 lb

## 2015-07-25 DIAGNOSIS — E119 Type 2 diabetes mellitus without complications: Secondary | ICD-10-CM | POA: Diagnosis not present

## 2015-07-25 DIAGNOSIS — R0981 Nasal congestion: Secondary | ICD-10-CM | POA: Insufficient documentation

## 2015-07-25 DIAGNOSIS — I10 Essential (primary) hypertension: Secondary | ICD-10-CM | POA: Diagnosis not present

## 2015-07-25 DIAGNOSIS — Z139 Encounter for screening, unspecified: Secondary | ICD-10-CM

## 2015-07-25 DIAGNOSIS — E669 Obesity, unspecified: Secondary | ICD-10-CM | POA: Diagnosis not present

## 2015-07-25 LAB — MICROALBUMIN / CREATININE URINE RATIO
CREATININE, U: 49 mg/dL
Microalb Creat Ratio: 1.4 mg/g (ref 0.0–30.0)
Microalb, Ur: <0.7 mg/dL (ref 0.0–1.9)

## 2015-07-25 LAB — HEMOGLOBIN A1C: HEMOGLOBIN A1C: 6.3 % (ref 4.6–6.5)

## 2015-07-25 LAB — COMPREHENSIVE METABOLIC PANEL
ALT: 41 U/L — ABNORMAL HIGH (ref 0–35)
AST: 28 U/L (ref 0–37)
Albumin: 4.3 g/dL (ref 3.5–5.2)
Alkaline Phosphatase: 63 U/L (ref 39–117)
BILIRUBIN TOTAL: 0.4 mg/dL (ref 0.2–1.2)
BUN: 19 mg/dL (ref 6–23)
CALCIUM: 9.5 mg/dL (ref 8.4–10.5)
CHLORIDE: 102 meq/L (ref 96–112)
CO2: 29 meq/L (ref 19–32)
CREATININE: 0.69 mg/dL (ref 0.40–1.20)
GFR: 92.15 mL/min (ref >60.00)
Glucose, Bld: 103 mg/dL — ABNORMAL HIGH (ref 70–99)
Potassium: 4.1 mEq/L (ref 3.5–5.1)
Sodium: 142 mEq/L (ref 135–145)
TOTAL PROTEIN: 7.2 g/dL (ref 6.0–8.3)

## 2015-07-25 LAB — LIPID PANEL
CHOL/HDL RATIO: 6
CHOLESTEROL: 148 mg/dL (ref 0–200)
HDL: 25.2 mg/dL — ABNORMAL LOW (ref >39.00)
LDL Cholesterol: 84 mg/dL (ref 0–99)
NONHDL: 123.21
Triglycerides: 196 mg/dL — ABNORMAL HIGH (ref 0.0–149.0)
VLDL: 39.2 mg/dL (ref 0.0–40.0)

## 2015-07-25 MED ORDER — OMEPRAZOLE 40 MG PO CPDR: DELAYED_RELEASE_CAPSULE | ORAL | Status: DC

## 2015-07-25 MED ORDER — MONTELUKAST SODIUM 10 MG PO TABS: ORAL_TABLET | ORAL | Status: DC

## 2015-07-25 NOTE — Patient Instructions (Signed)
Labs today.   Follow up in 3 months.  

## 2015-07-25 NOTE — Assessment & Plan Note (Signed)
Symptoms and exam most consistent with allergic rhinitis. Encouraged use of Allegra and Singulair. She will call if symptoms worsening or persistent.

## 2015-07-25 NOTE — Assessment & Plan Note (Signed)
BP Readings from Last 3 Encounters:  07/25/15 108/58  05/23/15 114/73  02/18/15 102/67   BP well controlled. Renal function with labs. Continue current meds.

## 2015-07-25 NOTE — Progress Notes (Signed)
Pre visit review using our clinic review tool, if applicable. No additional management support is needed unless otherwise documented below in the visit note. 

## 2015-07-25 NOTE — Assessment & Plan Note (Signed)
Will check A1c with labs. Continue current medications. 

## 2015-07-25 NOTE — Progress Notes (Signed)
Subjective:    Patient ID: Olivia Benton, female    DOB: 1956/11/10, 59 y.o.   MRN: [id number]  HPI  [name] presents for followup.  DM - BG well controlled. Compliant with medication.  Sinus congestion - Over last few days. Out of Singulair over last few days, however taking Allegra D. No fever, chills. No cough.  Wt Readings from Last 3 Encounters:  07/25/15 160 lb (72.576 kg)  05/23/15 162 lb 6 oz (73.653 kg)  02/18/15 159 lb 4 oz (72.235 kg)     Past medical, surgical, family and social history per today's encounter.   Review of Systems  Constitutional: Negative for fever, chills, appetite change, fatigue and unexpected weight change.  HENT: Positive for congestion and sinus pressure. Negative for postnasal drip, rhinorrhea, sneezing, sore throat, trouble swallowing and voice change.   Eyes: Negative for visual disturbance.  Respiratory: Negative for shortness of breath.   Cardiovascular: Negative for chest pain, palpitations and leg swelling.  Gastrointestinal: Negative for nausea, vomiting, abdominal pain, diarrhea and constipation.  Musculoskeletal: Negative for myalgias and arthralgias.  Skin: Negative for color change and rash.  Neurological: Negative for headaches.  Hematological: Negative for adenopathy. Does not bruise/bleed easily.  Psychiatric/Behavioral: Negative for dysphoric mood. The patient is not nervous/anxious.        Objective:    BP 108/58 mmHg  Pulse 80  Temp(Src) 98.1 F (36.7 C) (Oral)  Ht 4\' 10"  (1.473 m)  Wt 160 lb (72.576 kg)  BMI 33.45 kg/m2  SpO2 98% waist 39.5in Physical Exam  Constitutional: She is oriented to person, place, and time. She appears well-developed and well-nourished. No distress.  HENT:  Head: Normocephalic and atraumatic.  Right Ear: External ear normal.  Left Ear: External ear normal.  Nose: Nose normal.  Mouth/Throat: Oropharynx is clear and moist. No oropharyngeal exudate.  Eyes: Conjunctivae are  normal. Pupils are equal, round, and reactive to light. Right eye exhibits no discharge. Left eye exhibits no discharge. No scleral icterus.  Neck: Normal range of motion. Neck supple. No tracheal deviation present. No thyromegaly present.  Cardiovascular: Normal rate, regular rhythm, normal heart sounds and intact distal pulses.  Exam reveals no gallop and no friction rub.   No murmur heard. Pulmonary/Chest: Effort normal and breath sounds normal. No respiratory distress. She has no wheezes. She has no rales. She exhibits no tenderness.  Musculoskeletal: Normal range of motion. She exhibits no edema or tenderness.  Lymphadenopathy:    She has no cervical adenopathy.  Neurological: She is alert and oriented to person, place, and time. No cranial nerve deficit. She exhibits normal muscle tone. Coordination normal.  Skin: Skin is warm and dry. No rash noted. She is not diaphoretic. No erythema. No pallor.  Psychiatric: She has a normal mood and affect. Her behavior is normal. Judgment and thought content normal.          Assessment & Plan:   Problem List Items Addressed This Visit      Unprioritized   Diabetes mellitus type 2, controlled - Primary    Will check A1c with labs. Continue current medications.      Relevant Orders   Comprehensive metabolic panel   Hemoglobin A1c   Lipid panel   Microalbumin / creatinine urine ratio   Hypertension    BP Readings from Last 3 Encounters:  07/25/15 108/58  05/23/15 114/73  02/18/15 102/67   BP well controlled. Renal function with labs. Continue current meds.  Obesity (BMI 30-39.9)    Wt Readings from Last 3 Encounters:  07/25/15 160 lb (72.576 kg)  05/23/15 162 lb 6 oz (73.653 kg)  02/18/15 159 lb 4 oz (72.235 kg)   Body mass index is 33.45 kg/(m^2). Encouraged healthy diet and exercise.      Screening   Relevant Orders   Nicotine/cotinine metabolites   Sinus congestion    Symptoms and exam most consistent with allergic  rhinitis. Encouraged use of Allegra and Singulair. She will call if symptoms worsening or persistent.          Return in about 3 months (around 10/25/2015) for Recheck of Diabetes.

## 2015-07-25 NOTE — Assessment & Plan Note (Signed)
Wt Readings from Last 3 Encounters:  07/25/15 160 lb (72.576 kg)  05/23/15 162 lb 6 oz (73.653 kg)  02/18/15 159 lb 4 oz (72.235 kg)   Body mass index is 33.45 kg/(m^2). Encouraged healthy diet and exercise.

## 2015-07-26 LAB — NICOTINE/COTININE METABOLITES: Cotinine: <10 ng/mL

## 2015-07-29 ENCOUNTER — Telehealth: Payer: Self-pay | Admitting: Internal Medicine

## 2015-07-29 NOTE — Telephone Encounter (Signed)
Patient came into office to have copy of lab results faxed to insurance, copy faxed as requested.

## 2015-08-27 ENCOUNTER — Other Ambulatory Visit: Payer: Self-pay | Admitting: Internal Medicine

## 2015-09-26 ENCOUNTER — Ambulatory Visit (INDEPENDENT_AMBULATORY_CARE_PROVIDER_SITE_OTHER): Payer: 59 | Admitting: Internal Medicine

## 2015-09-26 ENCOUNTER — Other Ambulatory Visit: Payer: Self-pay

## 2015-09-26 ENCOUNTER — Telehealth: Payer: Self-pay

## 2015-09-26 ENCOUNTER — Encounter: Payer: Self-pay | Admitting: Internal Medicine

## 2015-09-26 VITALS — BP 107/77 | HR 67 | Temp 98.2°F | Ht <= 58 in | Wt 162.0 lb

## 2015-09-26 DIAGNOSIS — E669 Obesity, unspecified: Secondary | ICD-10-CM

## 2015-09-26 MED ORDER — LIRAGLUTIDE -WEIGHT MANAGEMENT 18 MG/3ML ~~LOC~~ SOPN: 0.6000 mg | PEN_INJECTOR | Freq: Every morning | SUBCUTANEOUS | Status: DC

## 2015-09-26 NOTE — Patient Instructions (Addendum)
Look into coverage for Saxenda.  [email or web address]  Write down your food intake, record into My[employer]itness Pal.  Set goal of exercise 94min daily.  Look at book, Whole 30.

## 2015-09-26 NOTE — Assessment & Plan Note (Signed)
Wt Readings from Last 3 Encounters:  09/26/15 162 lb (73.483 kg)  07/25/15 160 lb (72.576 kg)  05/23/15 162 lb 6 oz (73.653 kg)   Body mass index is 33.87 kg/(m^2). Encouraged healthy diet and exercise. Discussed adding Saxenda to help with appetite control. She will look into coverage and follow up.

## 2015-09-26 NOTE — Telephone Encounter (Signed)
OK. Fine to start Saxenda 0.6mg  [place] daily. Disp 69ml pen. Refill #3. Then, will need a 1 week follow up.

## 2015-09-26 NOTE — Progress Notes (Signed)
Pre visit review using our clinic review tool, if applicable. No additional management support is needed unless otherwise documented below in the visit note. 

## 2015-09-26 NOTE — Telephone Encounter (Signed)
Patient called the triage line, requesting a prescription for Saxenda.  Patient has prescription card already.  Please advise?

## 2015-09-26 NOTE — Progress Notes (Signed)
Subjective:    Patient ID: Olivia Benton, female    DOB: 12/31/1955, 59 y.o.   MRN: [id number]  HPI  [name] presents for follow up.  Obesity - Her employer requires BMI less than 29 or pay higher insurance premium. She has tried Weight Watchers but could not stick with program. She is not currently following any particular diet or exercise program.  Wt Readings from Last 3 Encounters:  09/26/15 162 lb (73.483 kg)  07/25/15 160 lb (72.576 kg)  05/23/15 162 lb 6 oz (73.653 kg)   BP Readings from Last 3 Encounters:  09/26/15 107/77  07/25/15 108/58  05/23/15 114/73    Past Medical History  Diagnosis Date  . Asthma     extrinsic  . Diabetes mellitus     non-insulin dependent  . Foot fracture   . Rhinitis   . Hyperlipidemia   . Hypertension    Family History  Problem Relation Age of Onset  . Hypertension Mother    Past Surgical History  Procedure Laterality Date  . Abdominal hysterectomy    . Back surgery      followed by Dr. Carloyn Manner  . Breast biopsy  09/2011    Dr. Tamala Julian   Social History   Social History  . Marital Status: Divorced    Spouse Name: N/A  . Number of Children: N/A  . Years of Education: N/A   Social History Main Topics  . Smoking status: Former Smoker    Quit date: 10/17/2009  . Smokeless tobacco: Never Used  . Alcohol Use: No  . Drug Use: No  . Sexual Activity: Not Asked   Other Topics Concern  . None   Social History Narrative    Review of Systems  Constitutional: Negative for fever, chills, appetite change, fatigue and unexpected weight change.  Eyes: Negative for visual disturbance.  Respiratory: Negative for shortness of breath.   Cardiovascular: Negative for chest pain and leg swelling.  Gastrointestinal: Negative for nausea, vomiting, abdominal pain, diarrhea and constipation.  Musculoskeletal: Positive for myalgias and back pain. Negative for arthralgias.  Skin: Negative for color change and rash.  Hematological:  Negative for adenopathy. Does not bruise/bleed easily.  Psychiatric/Behavioral: Negative for sleep disturbance and dysphoric mood. The patient is not nervous/anxious.        Objective:    BP 107/77 mmHg  Pulse 67  Temp(Src) 98.2 F (36.8 C) (Oral)  Ht 4\' 10"  (1.473 m)  Wt 162 lb (73.483 kg)  BMI 33.87 kg/m2  SpO2 97% Physical Exam  Constitutional: She is oriented to person, place, and time. She appears well-developed and well-nourished. No distress.  HENT:  Head: Normocephalic and atraumatic.  Right Ear: External ear normal.  Left Ear: External ear normal.  Nose: Nose normal.  Mouth/Throat: Oropharynx is clear and moist.  Eyes: Conjunctivae are normal. Pupils are equal, round, and reactive to light. Right eye exhibits no discharge. Left eye exhibits no discharge. No scleral icterus.  Neck: Normal range of motion. Neck supple. No tracheal deviation present. No thyromegaly present.  Cardiovascular: Normal rate, regular rhythm, normal heart sounds and intact distal pulses.  Exam reveals no gallop and no friction rub.   No murmur heard. Pulmonary/Chest: Effort normal and breath sounds normal. No respiratory distress. She has no wheezes. She has no rales. She exhibits no tenderness.  Musculoskeletal: Normal range of motion. She exhibits no edema or tenderness.  Lymphadenopathy:    She has no cervical adenopathy.  Neurological: She is alert and  oriented to person, place, and time. No cranial nerve deficit. She exhibits normal muscle tone. Coordination normal.  Skin: Skin is warm and dry. No rash noted. She is not diaphoretic. No erythema. No pallor.  Psychiatric: She has a normal mood and affect. Her behavior is normal. Judgment and thought content normal.          Assessment & Plan:  Over 68min of which >50% spent in face-to-face contact with patient discussing plan of care, options for weight loss/appetite suppression.  Problem List Items Addressed This Visit       Unprioritized   Obesity (BMI 30-39.9) - Primary    Wt Readings from Last 3 Encounters:  09/26/15 162 lb (73.483 kg)  07/25/15 160 lb (72.576 kg)  05/23/15 162 lb 6 oz (73.653 kg)   Body mass index is 33.87 kg/(m^2). Encouraged healthy diet and exercise. Discussed adding Saxenda to help with appetite control. She will look into coverage and follow up.           Return in about 3 months (around 12/27/2015) for Recheck.

## 2015-09-28 ENCOUNTER — Other Ambulatory Visit: Payer: Self-pay | Admitting: Internal Medicine

## 2015-09-28 DIAGNOSIS — Z1231 Encounter for screening mammogram for malignant neoplasm of breast: Secondary | ICD-10-CM

## 2015-09-29 ENCOUNTER — Telehealth: Payer: Self-pay

## 2015-09-29 NOTE — Telephone Encounter (Signed)
PA process started on Cover my Meds for Saxenda.

## 2015-09-29 NOTE — Telephone Encounter (Signed)
Documentation of approval came today for drug, it is on the list of covered drugs.

## 2015-10-03 ENCOUNTER — Telehealth: Payer: Self-pay | Admitting: Internal Medicine

## 2015-10-03 NOTE — Telephone Encounter (Signed)
Pt called about the medication Liraglutide -Weight Management (SAXENDA) 18 MG/3ML SOPN is making her nauseous. Pt states she could not go to work due to the nausea. Pt wanted to know if she can have something called in for nausea. Pharmacy is [facility] in [place]. Thank You!

## 2015-10-03 NOTE — Telephone Encounter (Signed)
OK. She should STOP the Saxenda. We can call in Ondansetron 8mg  po tid prn nausea disp 30 no refill.

## 2015-10-03 NOTE — Telephone Encounter (Signed)
Patient state she would like to talk about the weight loss medication on 10/10/15 at 7:30 am.  Patient did not want me to cal the Ondansetron in until she saw you on Monday.

## 2015-10-05 ENCOUNTER — Ambulatory Visit: Payer: 59 | Admitting: Internal Medicine

## 2015-10-10 ENCOUNTER — Ambulatory Visit (INDEPENDENT_AMBULATORY_CARE_PROVIDER_SITE_OTHER): Payer: 59 | Admitting: Internal Medicine

## 2015-10-10 ENCOUNTER — Ambulatory Visit: Payer: 59 | Admitting: Internal Medicine

## 2015-10-10 ENCOUNTER — Encounter: Payer: Self-pay | Admitting: Internal Medicine

## 2015-10-10 VITALS — BP 94/65 | HR 70 | Temp 98.1°F | Ht <= 58 in | Wt 160.4 lb

## 2015-10-10 DIAGNOSIS — E669 Obesity, unspecified: Secondary | ICD-10-CM

## 2015-10-10 MED ORDER — ONDANSETRON 8 MG PO TBDP: 8.0000 mg | ORAL_TABLET | Freq: Three times a day (TID) | ORAL | Status: DC | PRN

## 2015-10-10 NOTE — Assessment & Plan Note (Signed)
Wt Readings from Last 3 Encounters:  10/10/15 160 lb 6 oz (72.746 kg)  09/26/15 162 lb (73.483 kg)  07/25/15 160 lb (72.576 kg)   Will restart Saxenda 0.6mg  and add Ondansetron for 1 week to help with nausea. Follow up in 2 weeks.

## 2015-10-10 NOTE — Progress Notes (Signed)
Pre visit review using our clinic review tool, if applicable. No additional management support is needed unless otherwise documented below in the visit note. 

## 2015-10-10 NOTE — Patient Instructions (Signed)
Start back on Saxenda 0.6mg  daily.  Take Ondansetron 8mg  if feeling nauseous.

## 2015-10-10 NOTE — Progress Notes (Signed)
Subjective:    Patient ID: Olivia Benton, female    DOB: 10-Dec-1956, 59 y.o.   MRN: [id number]  HPI  [name] presents for follow up.  Obesity - Started on Saxenda last visit. Took medication twice but had nausea with medication.  Wt Readings from Last 3 Encounters:  10/10/15 160 lb 6 oz (72.746 kg)  09/26/15 162 lb (73.483 kg)  07/25/15 160 lb (72.576 kg)   BP Readings from Last 3 Encounters:  10/10/15 94/65  09/26/15 107/77  07/25/15 108/58    Past Medical History  Diagnosis Date  . Asthma     extrinsic  . Diabetes mellitus     non-insulin dependent  . Foot fracture   . Rhinitis   . Hyperlipidemia   . Hypertension    Family History  Problem Relation Age of Onset  . Hypertension Mother    Past Surgical History  Procedure Laterality Date  . Abdominal hysterectomy    . Back surgery      followed by Dr. Carloyn Manner  . Breast biopsy  09/2011    Dr. Tamala Julian   Social History   Social History  . Marital Status: Divorced    Spouse Name: N/A  . Number of Children: N/A  . Years of Education: N/A   Social History Main Topics  . Smoking status: Former Smoker    Quit date: 10/17/2009  . Smokeless tobacco: Never Used  . Alcohol Use: No  . Drug Use: No  . Sexual Activity: Not Asked   Other Topics Concern  . None   Social History Narrative    Review of Systems  Constitutional: Negative for fever, chills, appetite change, fatigue and unexpected weight change.  Eyes: Negative for visual disturbance.  Respiratory: Negative for shortness of breath.   Cardiovascular: Negative for chest pain and leg swelling.  Gastrointestinal: Positive for nausea. Negative for vomiting, abdominal pain, diarrhea and constipation.  Musculoskeletal: Negative for myalgias and arthralgias.  Skin: Negative for color change and rash.  Hematological: Negative for adenopathy. Does not bruise/bleed easily.  Psychiatric/Behavioral: Negative for sleep disturbance and dysphoric mood. The  patient is not nervous/anxious.        Objective:    BP 94/65 mmHg  Pulse 70  Temp(Src) 98.1 F (36.7 C) (Oral)  Ht 4\' 10"  (1.473 m)  Wt 160 lb 6 oz (72.746 kg)  BMI 33.53 kg/m2  SpO2 98% Physical Exam  Constitutional: She is oriented to person, place, and time. She appears well-developed and well-nourished. No distress.  HENT:  Head: Normocephalic and atraumatic.  Right Ear: External ear normal.  Left Ear: External ear normal.  Nose: Nose normal.  Mouth/Throat: Oropharynx is clear and moist.  Eyes: Conjunctivae are normal. Pupils are equal, round, and reactive to light. Right eye exhibits no discharge. Left eye exhibits no discharge. No scleral icterus.  Neck: Normal range of motion. Neck supple. No tracheal deviation present. No thyromegaly present.  Cardiovascular: Normal rate, regular rhythm, normal heart sounds and intact distal pulses.  Exam reveals no gallop and no friction rub.   No murmur heard. Pulmonary/Chest: Effort normal and breath sounds normal. No respiratory distress. She has no wheezes. She has no rales. She exhibits no tenderness.  Musculoskeletal: Normal range of motion. She exhibits no edema or tenderness.  Lymphadenopathy:    She has no cervical adenopathy.  Neurological: She is alert and oriented to person, place, and time. No cranial nerve deficit. She exhibits normal muscle tone. Coordination normal.  Skin: Skin is  warm and dry. No rash noted. She is not diaphoretic. No erythema. No pallor.  Psychiatric: She has a normal mood and affect. Her behavior is normal. Judgment and thought content normal.          Assessment & Plan:   Problem List Items Addressed This Visit      Unprioritized   Obesity (BMI 30-39.9) - Primary    Wt Readings from Last 3 Encounters:  10/10/15 160 lb 6 oz (72.746 kg)  09/26/15 162 lb (73.483 kg)  07/25/15 160 lb (72.576 kg)   Will restart Saxenda 0.6mg  and add Ondansetron for 1 week to help with nausea. Follow up in 2  weeks.          Return in about 2 weeks (around 10/24/2015).

## 2015-10-21 ENCOUNTER — Ambulatory Visit
Admission: RE | Admit: 2015-10-21 | Discharge: 2015-10-21 | Disposition: A | Discharge status: Home / Self Care | Payer: 59 | Source: Ambulatory Visit | Attending: Internal Medicine | Admitting: Internal Medicine

## 2015-10-21 DIAGNOSIS — Z1231 Encounter for screening mammogram for malignant neoplasm of breast: Secondary | ICD-10-CM | POA: Diagnosis present

## 2015-10-27 ENCOUNTER — Ambulatory Visit: Payer: 59 | Admitting: Internal Medicine

## 2015-10-27 ENCOUNTER — Encounter: Payer: Self-pay | Admitting: Internal Medicine

## 2015-10-27 ENCOUNTER — Ambulatory Visit (INDEPENDENT_AMBULATORY_CARE_PROVIDER_SITE_OTHER): Payer: 59 | Admitting: Internal Medicine

## 2015-10-27 VITALS — BP 113/77 | HR 73 | Temp 98.3°F | Ht <= 58 in | Wt 159.0 lb

## 2015-10-27 DIAGNOSIS — M25511 Pain in right shoulder: Secondary | ICD-10-CM | POA: Insufficient documentation

## 2015-10-27 DIAGNOSIS — E669 Obesity, unspecified: Secondary | ICD-10-CM | POA: Diagnosis not present

## 2015-10-27 MED ORDER — METFORMIN HCL 500 MG PO TABS: 500.0000 mg | ORAL_TABLET | Freq: Every day | ORAL | Status: DC

## 2015-10-27 NOTE — Patient Instructions (Addendum)
Increase Saxenda to 1.2mg  injected daily for 2 weeks, then increase to 1.8mg  daily for 2 weeks.  Set goal of walking 10 min daily at minimum.  Follow up in 4 weeks.

## 2015-10-27 NOTE — Progress Notes (Signed)
Subjective:    Patient ID: Olivia Benton, female    DOB: 12-29-1955, 59 y.o.   MRN: [id number]  HPI  [name] presents for follow up.  Obesity - Started Saxenda 2 weeks ago.  Notes decrease in appetite. No nausea or abdominal pain. Limiting caloric intake, avoiding processed sugars.  Right shoulder pain - Aching pain with abduction of the arm last few months. Not taking anything for this. No weakness or numbness noted. No known trauma to shoulder.   Wt Readings from Last 3 Encounters:  10/27/15 159 lb (72.122 kg)  10/10/15 160 lb 6 oz (72.746 kg)  09/26/15 162 lb (73.483 kg)   BP Readings from Last 3 Encounters:  10/27/15 113/77  10/10/15 94/65  09/26/15 107/77    Past Medical History  Diagnosis Date  . Asthma     extrinsic  . Diabetes mellitus     non-insulin dependent  . Foot fracture   . Rhinitis   . Hyperlipidemia   . Hypertension    Family History  Problem Relation Age of Onset  . Hypertension Mother    Past Surgical History  Procedure Laterality Date  . Abdominal hysterectomy    . Back surgery      followed by Dr. Carloyn Manner  . Breast biopsy Left 09/2011    Dr. Tamala Julian   Social History   Social History  . Marital Status: Divorced    Spouse Name: N/A  . Number of Children: N/A  . Years of Education: N/A   Social History Main Topics  . Smoking status: Former Smoker    Quit date: 10/17/2009  . Smokeless tobacco: Never Used  . Alcohol Use: No  . Drug Use: No  . Sexual Activity: Not Asked   Other Topics Concern  . None   Social History Narrative    Review of Systems  Constitutional: Negative for fever, chills, appetite change, fatigue and unexpected weight change.  Eyes: Negative for visual disturbance.  Respiratory: Negative for shortness of breath.   Cardiovascular: Negative for chest pain and leg swelling.  Gastrointestinal: Negative for nausea, vomiting, abdominal pain, diarrhea and constipation.  Musculoskeletal: Positive for myalgias  and arthralgias. Negative for joint swelling.  Skin: Negative for color change and rash.  Hematological: Negative for adenopathy. Does not bruise/bleed easily.  Psychiatric/Behavioral: Negative for sleep disturbance and dysphoric mood. The patient is not nervous/anxious.        Objective:    BP 113/77 mmHg  Pulse 73  Temp(Src) 98.3 F (36.8 C) (Oral)  Ht 4\' 10"  (1.473 m)  Wt 159 lb (72.122 kg)  BMI 33.24 kg/m2  SpO2 96% Physical Exam  Constitutional: She is oriented to person, place, and time. She appears well-developed and well-nourished. No distress.  HENT:  Head: Normocephalic and atraumatic.  Right Ear: External ear normal.  Left Ear: External ear normal.  Nose: Nose normal.  Mouth/Throat: Oropharynx is clear and moist. No oropharyngeal exudate.  Eyes: Conjunctivae are normal. Pupils are equal, round, and reactive to light. Right eye exhibits no discharge. Left eye exhibits no discharge. No scleral icterus.  Neck: Normal range of motion. Neck supple. No tracheal deviation present. No thyromegaly present.  Cardiovascular: Normal rate, regular rhythm, normal heart sounds and intact distal pulses.  Exam reveals no gallop and no friction rub.   No murmur heard. Pulmonary/Chest: Effort normal and breath sounds normal. No respiratory distress. She has no wheezes. She has no rales. She exhibits no tenderness.  Musculoskeletal: Normal range of motion. She  exhibits no edema or tenderness.       Right shoulder: She exhibits pain (with abduction). She exhibits normal range of motion, no tenderness and normal strength.  Lymphadenopathy:    She has no cervical adenopathy.  Neurological: She is alert and oriented to person, place, and time. No cranial nerve deficit. She exhibits normal muscle tone. Coordination normal.  Skin: Skin is warm and dry. No rash noted. She is not diaphoretic. No erythema. No pallor.  Psychiatric: She has a normal mood and affect. Her behavior is normal. Judgment  and thought content normal.          Assessment & Plan:   Problem List Items Addressed This Visit      Unprioritized   Obesity (BMI 30-39.9) - Primary    Appetite improved on Saxenda. Will increase dose to 1.2mg  daily for 2 weeks, then to 1.8mg  daily for 2 weeks. Discussed potential side effects. Encouraged her to follow a diet low in processed sugars. Encouraged her to exercise. Follow up in 4 weeks.      Relevant Medications   metFORMIN (GLUCOPHAGE) 500 MG tablet   Right shoulder pain    Right aching shoulder pain. Discussed potential causes and referral to ortho for evaluation. She declines for now. Will follow up in 4 weeks and prn.          Return in about 4 weeks (around 11/24/2015) for Recheck.

## 2015-10-27 NOTE — Assessment & Plan Note (Signed)
Appetite improved on Saxenda. Will increase dose to 1.2mg  daily for 2 weeks, then to 1.8mg  daily for 2 weeks. Discussed potential side effects. Encouraged her to follow a diet low in processed sugars. Encouraged her to exercise. Follow up in 4 weeks.

## 2015-10-27 NOTE — Progress Notes (Signed)
Pre visit review using our clinic review tool, if applicable. No additional management support is needed unless otherwise documented below in the visit note. 

## 2015-10-27 NOTE — Assessment & Plan Note (Signed)
Right aching shoulder pain. Discussed potential causes and referral to ortho for evaluation. She declines for now. Will follow up in 4 weeks and prn.

## 2015-11-01 ENCOUNTER — Other Ambulatory Visit: Payer: Self-pay | Admitting: Internal Medicine

## 2015-11-24 ENCOUNTER — Ambulatory Visit (INDEPENDENT_AMBULATORY_CARE_PROVIDER_SITE_OTHER): Payer: 59 | Admitting: Internal Medicine

## 2015-11-24 ENCOUNTER — Other Ambulatory Visit: Payer: Self-pay | Admitting: Internal Medicine

## 2015-11-24 ENCOUNTER — Encounter: Payer: Self-pay | Admitting: Internal Medicine

## 2015-11-24 VITALS — BP 114/75 | HR 67 | Temp 98.0°F | Ht <= 58 in | Wt 156.1 lb

## 2015-11-24 DIAGNOSIS — I10 Essential (primary) hypertension: Secondary | ICD-10-CM

## 2015-11-24 DIAGNOSIS — E119 Type 2 diabetes mellitus without complications: Secondary | ICD-10-CM

## 2015-11-24 DIAGNOSIS — E669 Obesity, unspecified: Secondary | ICD-10-CM | POA: Diagnosis not present

## 2015-11-24 NOTE — Progress Notes (Signed)
Subjective:    Patient ID: Olivia Benton, female    DOB: [birth year], 59 y.o.   MRN: [id number]  HPI  [name] presents for follow up.  Having some nausea and constipation with use of Saxenda on 1.2mg  dosing. Stopped medication.  DM - has not checked BG. Compliant with medication.  On chronic narcotics for back pain. Has some constipation with this.   Wt Readings from Last 3 Encounters:  11/24/15 156 lb 2 oz (70.818 kg)  10/27/15 159 lb (72.122 kg)  10/10/15 160 lb 6 oz (72.746 kg)   BP Readings from Last 3 Encounters:  11/24/15 114/75  10/27/15 113/77  10/10/15 94/65    Past Medical History  Diagnosis Date  . Asthma     extrinsic  . Diabetes mellitus     non-insulin dependent  . Foot fracture   . Rhinitis   . Hyperlipidemia   . Hypertension    Family History  Problem Relation Age of Onset  . Hypertension Mother    Past Surgical History  Procedure Laterality Date  . Abdominal hysterectomy    . Back surgery      followed by Dr. Carloyn Manner  . Breast biopsy Left 09/2011    Dr. Tamala Julian   Social History   Social History  . Marital Status: Divorced    Spouse Name: N/A  . Number of Children: N/A  . Years of Education: N/A   Social History Main Topics  . Smoking status: Former Smoker    Quit date: 10/17/2009  . Smokeless tobacco: Never Used  . Alcohol Use: No  . Drug Use: No  . Sexual Activity: Not Asked   Other Topics Concern  . None   Social History Narrative    Review of Systems  Constitutional: Negative for fever, chills, appetite change, fatigue and unexpected weight change.  Eyes: Negative for visual disturbance.  Respiratory: Negative for shortness of breath.   Cardiovascular: Negative for chest pain and leg swelling.  Gastrointestinal: Positive for nausea and constipation. Negative for vomiting, abdominal pain and diarrhea.  Musculoskeletal: Negative for myalgias and arthralgias.  Skin: Negative for color change and rash.  Hematological:  Negative for adenopathy. Does not bruise/bleed easily.  Psychiatric/Behavioral: Negative for sleep disturbance and dysphoric mood. The patient is not nervous/anxious.        Objective:    BP 114/75 mmHg  Pulse 67  Temp(Src) 98 F (36.7 C) (Oral)  Ht 4\' 10"  (1.473 m)  Wt 156 lb 2 oz (70.818 kg)  BMI 32.64 kg/m2  SpO2 97% Physical Exam  Constitutional: She is oriented to person, place, and time. She appears well-developed and well-nourished. No distress.  HENT:  Head: Normocephalic and atraumatic.  Right Ear: External ear normal.  Left Ear: External ear normal.  Nose: Nose normal.  Mouth/Throat: Oropharynx is clear and moist. No oropharyngeal exudate.  Eyes: Conjunctivae are normal. Pupils are equal, round, and reactive to light. Right eye exhibits no discharge. Left eye exhibits no discharge. No scleral icterus.  Neck: Normal range of motion. Neck supple. No tracheal deviation present. No thyromegaly present.  Cardiovascular: Normal rate, regular rhythm, normal heart sounds and intact distal pulses.  Exam reveals no gallop and no friction rub.   No murmur heard. Pulmonary/Chest: Effort normal and breath sounds normal. No respiratory distress. She has no wheezes. She has no rales. She exhibits no tenderness.  Musculoskeletal: Normal range of motion. She exhibits no edema or tenderness.  Lymphadenopathy:    She has no cervical  adenopathy.  Neurological: She is alert and oriented to person, place, and time. No cranial nerve deficit. She exhibits normal muscle tone. Coordination normal.  Skin: Skin is warm and dry. No rash noted. She is not diaphoretic. No erythema. No pallor.  Psychiatric: She has a normal mood and affect. Her behavior is normal. Judgment and thought content normal.          Assessment & Plan:   Problem List Items Addressed This Visit      Unprioritized   Diabetes mellitus type 2, controlled ([place]) - Primary    Will check A1c with labs. Encouraged low  carbohydrate diet. Continue Metformin.      Hypertension    BP Readings from Last 3 Encounters:  11/24/15 114/75  10/27/15 113/77  10/10/15 94/65   BP well controlled. Renal function with labs today.      Obesity (BMI 30-39.9)    Wt Readings from Last 3 Encounters:  11/24/15 156 lb 2 oz (70.818 kg)  10/27/15 159 lb (72.122 kg)  10/10/15 160 lb 6 oz (72.746 kg)   Body mass index is 32.64 kg/(m^2). Unable to tolerate Saxenda. Encouraged her to limit carbohydrate intake and consider intermittent fasting for periods of 8-16hr during the day to help with weight loss.          Return in about 3 months (around 02/22/2016) for Recheck of Diabetes.

## 2015-11-24 NOTE — Assessment & Plan Note (Signed)
BP Readings from Last 3 Encounters:  11/24/15 114/75  10/27/15 113/77  10/10/15 94/65   BP well controlled. Renal function with labs today.

## 2015-11-24 NOTE — Assessment & Plan Note (Signed)
Will check A1c with labs. Encouraged low carbohydrate diet. Continue Metformin.

## 2015-11-24 NOTE — Progress Notes (Signed)
Pre visit review using our clinic review tool, if applicable. No additional management support is needed unless otherwise documented below in the visit note. 

## 2015-11-24 NOTE — Patient Instructions (Addendum)
Labs today through [facility].  Try limiting carbohydrate intake to 35gm per meal.  Consider periods of fasting during daytime as discussed.

## 2015-11-24 NOTE — Assessment & Plan Note (Signed)
Wt Readings from Last 3 Encounters:  11/24/15 156 lb 2 oz (70.818 kg)  10/27/15 159 lb (72.122 kg)  10/10/15 160 lb 6 oz (72.746 kg)   Body mass index is 32.64 kg/(m^2). Unable to tolerate Saxenda. Encouraged her to limit carbohydrate intake and consider intermittent fasting for periods of 8-16hr during the day to help with weight loss.

## 2015-11-25 LAB — COMPREHENSIVE METABOLIC PANEL
A/G RATIO: 2 (ref 1.1–2.5)
ALBUMIN: 4.5 g/dL (ref 3.6–4.8)
ALK PHOS: 65 IU/L (ref 39–117)
ALT: 36 IU/L — ABNORMAL HIGH (ref 0–32)
AST: 24 IU/L (ref 0–40)
BUN / CREAT RATIO: 17 (ref 11–26)
BUN: 11 mg/dL (ref 8–27)
Bilirubin Total: 0.3 mg/dL (ref 0.0–1.2)
CO2: 27 mmol/L (ref 18–29)
CREATININE: 0.66 mg/dL (ref 0.57–1.00)
Calcium: 9.2 mg/dL (ref 8.7–10.3)
Chloride: 101 mmol/L (ref 97–106)
GFR calc Af Amer: 111 mL/min/{1.73_m2} (ref >59)
GFR calc non Af Amer: 96 mL/min/{1.73_m2} (ref >59)
GLOBULIN, TOTAL: 2.3 g/dL (ref 1.5–4.5)
Glucose: 104 mg/dL — ABNORMAL HIGH (ref 65–99)
POTASSIUM: 3.9 mmol/L (ref 3.5–5.2)
SODIUM: 144 mmol/L (ref 136–144)
Total Protein: 6.8 g/dL (ref 6.0–8.5)

## 2015-11-25 LAB — LIPID PANEL W/O CHOL/HDL RATIO
CHOLESTEROL TOTAL: 158 mg/dL (ref 100–199)
HDL: 25 mg/dL — ABNORMAL LOW (ref >39)
LDL CALC: 107 mg/dL — AB (ref 0–99)
Triglycerides: 132 mg/dL (ref 0–149)
VLDL Cholesterol Cal: 26 mg/dL (ref 5–40)

## 2015-11-25 LAB — HGB A1C W/O EAG: HEMOGLOBIN A1C: 6.1 % — AB (ref 4.8–5.6)

## 2015-12-28 ENCOUNTER — Ambulatory Visit: Payer: 59 | Admitting: Internal Medicine

## 2016-01-10 ENCOUNTER — Other Ambulatory Visit: Payer: Self-pay

## 2016-01-10 ENCOUNTER — Other Ambulatory Visit: Payer: Self-pay | Admitting: Internal Medicine

## 2016-01-10 MED ORDER — GLUCOSE BLOOD VI STRP: ORAL_STRIP | Status: DC

## 2016-01-30 ENCOUNTER — Encounter: Payer: Self-pay | Admitting: Family Medicine

## 2016-01-30 ENCOUNTER — Ambulatory Visit (INDEPENDENT_AMBULATORY_CARE_PROVIDER_SITE_OTHER): Payer: 59 | Admitting: Family Medicine

## 2016-01-30 VITALS — BP 118/70 | HR 86 | Temp 98.5°F | Ht <= 58 in | Wt 161.5 lb

## 2016-01-30 DIAGNOSIS — J069 Acute upper respiratory infection, unspecified: Secondary | ICD-10-CM | POA: Diagnosis not present

## 2016-01-30 NOTE — Assessment & Plan Note (Signed)
New from. Exam unremarkable. Likely viral. Advise continued supportive care and Allegra & Flonase. Follow up as needed.

## 2016-01-30 NOTE — Progress Notes (Signed)
   Subjective:  Patient ID: Olivia Benton, female    DOB: [birth year]  Age: 60 y.o. MRN: [id number]  CC: URI symptoms  HPI:  60 year old female presents to clinic today with complaints of sore throat, nasal congestion.  Patient states that she has not been feeling well since Saturday. She been expressing sore throat, nasal/head congestion. She's been having difficulty sleeping as is worse at night. No associated fevers or chills. She's been taking Allegra and Flonase with no improvement. No known exacerbating or relieving factors.  Social Hx   Social History   Social History  . Marital Status: Divorced    Spouse Name: N/A  . Number of Children: N/A  . Years of Education: N/A   Social History Main Topics  . Smoking status: Former Smoker    Quit date: 10/17/2009  . Smokeless tobacco: Never Used  . Alcohol Use: No  . Drug Use: No  . Sexual Activity: Not Asked   Other Topics Concern  . None   Social History Narrative   Review of Systems  Constitutional: Negative for fever.  HENT: Positive for congestion, sinus pressure and sore throat.    Objective:  BP 118/70 mmHg  Pulse 86  Temp(Src) 98.5 F (36.9 C) (Oral)  Ht 4\' 10"  (1.473 m)  Wt 161 lb 8 oz (73.256 kg)  BMI 33.76 kg/m2  SpO2 98%  BP/Weight 01/30/2016 11/24/2015 [id number]  Systolic BP 123456 [id number]  Diastolic BP 70 75 77  Wt. (Lbs) 161.5 156.13 159  BMI 33.76 32.64 33.24   Physical Exam  Constitutional: She is oriented to person, place, and time. She appears well-developed. No distress.  HENT:  Head: Normocephalic and atraumatic.  Mild oropharyngeal erythema. No exudate. Normal TM's bilaterally.   Cardiovascular: Normal rate and regular rhythm.   Pulmonary/Chest: Effort normal and breath sounds normal.  Neurological: She is alert and oriented to person, place, and time.  Psychiatric: She has a normal mood and affect.  Vitals reviewed.  Lab Results  Component Value Date   WBC 6.3 03/01/2015   HGB  12.0 03/01/2015   HCT 36 03/01/2015   PLT 248 03/01/2015   GLUCOSE 104* 11/24/2015   CHOL 158 11/24/2015   TRIG 132 11/24/2015   HDL 25* 11/24/2015   LDLCALC 107* 11/24/2015   ALT 36* 11/24/2015   AST 24 11/24/2015   NA 144 11/24/2015   K 3.9 11/24/2015   CL 101 11/24/2015   CREATININE 0.66 11/24/2015   BUN 11 11/24/2015   CO2 27 11/24/2015   TSH 0.75 03/01/2015   HGBA1C 6.1* 11/24/2015   MICROALBUR <0.7 07/25/2015    Assessment & Plan:   Problem List Items Addressed This Visit    Viral URI - Primary    New from. Exam unremarkable. Likely viral. Advise continued supportive care and Allegra & Flonase. Follow up as needed.         Follow-up: PRN  [place]

## 2016-01-30 NOTE — Patient Instructions (Signed)
It was nice to see you today.  This is viral and will slowly improve.  No indication for antibiotics.  Call if you worsen or fail to improve.  Take care  Dr. Lacinda Axon

## 2016-02-22 ENCOUNTER — Ambulatory Visit (INDEPENDENT_AMBULATORY_CARE_PROVIDER_SITE_OTHER): Payer: 59 | Admitting: Internal Medicine

## 2016-02-22 ENCOUNTER — Encounter: Payer: Self-pay | Admitting: Internal Medicine

## 2016-02-22 VITALS — BP 107/77 | HR 66 | Temp 98.1°F | Ht <= 58 in | Wt 160.0 lb

## 2016-02-22 DIAGNOSIS — E119 Type 2 diabetes mellitus without complications: Secondary | ICD-10-CM | POA: Diagnosis not present

## 2016-02-22 DIAGNOSIS — I10 Essential (primary) hypertension: Secondary | ICD-10-CM | POA: Diagnosis not present

## 2016-02-22 NOTE — Progress Notes (Signed)
Pre visit review using our clinic review tool, if applicable. No additional management support is needed unless otherwise documented below in the visit note. 

## 2016-02-22 NOTE — Progress Notes (Signed)
Subjective:    Patient ID: Olivia Benton, female    DOB: 20-Apr-1956, 60 y.o.   MRN: [id number]  HPI  60YO female presents for follow up.  DM - BG slightly higher, in 160s at times. Compliant with medications.  HTN - BP well controlled. No CP, HA.  No new concerns today. Notes some increased stress dealing with mother who is hospitalized.  Wt Readings from Last 3 Encounters:  02/22/16 160 lb (72.576 kg)  01/30/16 161 lb 8 oz (73.256 kg)  11/24/15 156 lb 2 oz (70.818 kg)   BP Readings from Last 3 Encounters:  02/22/16 107/77  01/30/16 118/70  11/24/15 114/75    Past Medical History  Diagnosis Date  . Asthma     extrinsic  . Diabetes mellitus     non-insulin dependent  . Foot fracture   . Rhinitis   . Hyperlipidemia   . Hypertension    Family History  Problem Relation Age of Onset  . Hypertension Mother    Past Surgical History  Procedure Laterality Date  . Abdominal hysterectomy    . Back surgery      followed by Dr. Carloyn Manner  . Breast biopsy Left 09/2011    Dr. Tamala Julian   Social History   Social History  . Marital Status: Divorced    Spouse Name: N/A  . Number of Children: N/A  . Years of Education: N/A   Social History Main Topics  . Smoking status: Former Smoker    Quit date: 10/17/2009  . Smokeless tobacco: Never Used  . Alcohol Use: No  . Drug Use: No  . Sexual Activity: Not Asked   Other Topics Concern  . None   Social History Narrative    Review of Systems  Constitutional: Negative for fever, chills, appetite change, fatigue and unexpected weight change.  Eyes: Negative for visual disturbance.  Respiratory: Negative for shortness of breath.   Cardiovascular: Negative for chest pain and leg swelling.  Gastrointestinal: Negative for nausea, vomiting, abdominal pain, diarrhea and constipation.  Skin: Negative for color change and rash.  Hematological: Negative for adenopathy. Does not bruise/bleed easily.  Psychiatric/Behavioral: Negative  for dysphoric mood. The patient is not nervous/anxious.        Objective:    BP 107/77 mmHg  Pulse 66  Temp(Src) 98.1 F (36.7 C) (Oral)  Ht 4\' 10"  (1.473 m)  Wt 160 lb (72.576 kg)  BMI 33.45 kg/m2  SpO2 95% Physical Exam  Constitutional: She is oriented to person, place, and time. She appears well-developed and well-nourished. No distress.  HENT:  Head: Normocephalic and atraumatic.  Right Ear: External ear normal.  Left Ear: External ear normal.  Nose: Nose normal.  Mouth/Throat: Oropharynx is clear and moist. No oropharyngeal exudate.  Eyes: Conjunctivae are normal. Pupils are equal, round, and reactive to light. Right eye exhibits no discharge. Left eye exhibits no discharge. No scleral icterus.  Neck: Normal range of motion. Neck supple. No tracheal deviation present. No thyromegaly present.  Cardiovascular: Normal rate, regular rhythm, normal heart sounds and intact distal pulses.  Exam reveals no gallop and no friction rub.   No murmur heard. Pulmonary/Chest: Effort normal and breath sounds normal. No respiratory distress. She has no wheezes. She has no rales. She exhibits no tenderness.  Musculoskeletal: Normal range of motion. She exhibits no edema or tenderness.  Lymphadenopathy:    She has no cervical adenopathy.  Neurological: She is alert and oriented to person, place, and time. No cranial nerve  deficit. She exhibits normal muscle tone. Coordination normal.  Skin: Skin is warm and dry. No rash noted. She is not diaphoretic. No erythema. No pallor.  Psychiatric: She has a normal mood and affect. Her behavior is normal. Judgment and thought content normal.          Assessment & Plan:   Problem List Items Addressed This Visit      Unprioritized   Diabetes mellitus type 2, controlled ([place]) - Primary    BG generally have been well controlled. A1c with labs. Continue Metformin.      Relevant Orders   Comprehensive metabolic panel   Hemoglobin A1c    Hypertension    BP Readings from Last 3 Encounters:  02/22/16 107/77  01/30/16 118/70  11/24/15 114/75   BP well controlled. Renal function with labs. Continue current medication.          Return in about 3 months (around 05/24/2016) for Recheck of Diabetes.  Ronette Deter, MD Internal Medicine [facility]

## 2016-02-22 NOTE — Assessment & Plan Note (Signed)
BG generally have been well controlled. A1c with labs. Continue Metformin.

## 2016-02-22 NOTE — Patient Instructions (Signed)
Labs today.  Follow up in 3 months or sooner as needed. 

## 2016-02-22 NOTE — Assessment & Plan Note (Signed)
BP Readings from Last 3 Encounters:  02/22/16 107/77  01/30/16 118/70  11/24/15 114/75   BP well controlled. Renal function with labs. Continue current medication.

## 2016-02-23 LAB — COMPREHENSIVE METABOLIC PANEL
ALK PHOS: 66 IU/L (ref 39–117)
ALT: 28 IU/L (ref 0–32)
AST: 21 IU/L (ref 0–40)
Albumin/Globulin Ratio: 1.6 (ref 1.1–2.5)
Albumin: 4.1 g/dL (ref 3.6–4.8)
BUN/Creatinine Ratio: 23 (ref 11–26)
BUN: 15 mg/dL (ref 8–27)
Bilirubin Total: 0.2 mg/dL (ref 0.0–1.2)
CALCIUM: 9.2 mg/dL (ref 8.7–10.3)
CHLORIDE: 97 mmol/L (ref 96–106)
CO2: 25 mmol/L (ref 18–29)
CREATININE: 0.66 mg/dL (ref 0.57–1.00)
GFR calc Af Amer: 111 mL/min/{1.73_m2} (ref >59)
GFR calc non Af Amer: 96 mL/min/{1.73_m2} (ref >59)
Globulin, Total: 2.6 g/dL (ref 1.5–4.5)
Glucose: 109 mg/dL — ABNORMAL HIGH (ref 65–99)
Potassium: 4.2 mmol/L (ref 3.5–5.2)
Sodium: 143 mmol/L (ref 134–144)
Total Protein: 6.7 g/dL (ref 6.0–8.5)

## 2016-02-23 LAB — HEMOGLOBIN A1C
ESTIMATED AVERAGE GLUCOSE: 140 mg/dL
HEMOGLOBIN A1C: 6.5 % — AB (ref 4.8–5.6)

## 2016-02-28 ENCOUNTER — Other Ambulatory Visit: Payer: Self-pay | Admitting: Internal Medicine

## 2016-05-05 ENCOUNTER — Other Ambulatory Visit: Payer: Self-pay | Admitting: Internal Medicine

## 2016-05-24 ENCOUNTER — Encounter: Payer: Self-pay | Admitting: Internal Medicine

## 2016-05-24 ENCOUNTER — Ambulatory Visit (INDEPENDENT_AMBULATORY_CARE_PROVIDER_SITE_OTHER): Payer: 59 | Admitting: Internal Medicine

## 2016-05-24 VITALS — BP 110/72 | HR 72 | Temp 98.1°F | Ht <= 58 in | Wt 153.5 lb

## 2016-05-24 DIAGNOSIS — E669 Obesity, unspecified: Secondary | ICD-10-CM

## 2016-05-24 DIAGNOSIS — I10 Essential (primary) hypertension: Secondary | ICD-10-CM

## 2016-05-24 DIAGNOSIS — E119 Type 2 diabetes mellitus without complications: Secondary | ICD-10-CM

## 2016-05-24 MED ORDER — DESONIDE 0.05 % EX CREA: TOPICAL_CREAM | Freq: Two times a day (BID) | CUTANEOUS | Status: DC

## 2016-05-24 NOTE — Assessment & Plan Note (Signed)
BG well controlled by report. Will check A1c with labs. 

## 2016-05-24 NOTE — Patient Instructions (Addendum)
Follow up in 3 months for recheck 

## 2016-05-24 NOTE — Assessment & Plan Note (Signed)
BP Readings from Last 3 Encounters:  05/24/16 110/72  02/22/16 107/77  01/30/16 118/70   BP well controlled. Renal function with labs.

## 2016-05-24 NOTE — Progress Notes (Signed)
Subjective:    Patient ID: Olivia Benton, female    DOB: July 09, 1956, 60 y.o.   MRN: [id number]  HPI  60YO female presents for follow up.  DM - BG near 120. Compliant with medications.  Lab Results  Component Value Date   HGBA1C 6.5* 02/22/2016   Obesity - Started at [facility] in [place]. Followed by NP. Started on Phentermine in April. Also started on B12 shots. Following 1200 cal diet.  HTN - No CP, HA, palpitations. Compliant with medication.  Wt Readings from Last 3 Encounters:  05/24/16 153 lb 8 oz (69.627 kg)  02/22/16 160 lb (72.576 kg)  01/30/16 161 lb 8 oz (73.256 kg)   BP Readings from Last 3 Encounters:  05/24/16 110/72  02/22/16 107/77  01/30/16 118/70    Past Medical History  Diagnosis Date  . Asthma     extrinsic  . Diabetes mellitus     non-insulin dependent  . Foot fracture   . Rhinitis   . Hyperlipidemia   . Hypertension    Family History  Problem Relation Age of Onset  . Hypertension Mother    Past Surgical History  Procedure Laterality Date  . Abdominal hysterectomy    . Back surgery      followed by Dr. Carloyn Manner  . Breast biopsy Left 09/2011    Dr. Tamala Julian   Social History   Social History  . Marital Status: Divorced    Spouse Name: N/A  . Number of Children: N/A  . Years of Education: N/A   Social History Main Topics  . Smoking status: Former Smoker    Quit date: 10/17/2009  . Smokeless tobacco: Never Used  . Alcohol Use: No  . Drug Use: No  . Sexual Activity: Not Asked   Other Topics Concern  . None   Social History Narrative    Review of Systems  Constitutional: Negative for fever, chills, appetite change, fatigue and unexpected weight change.  Eyes: Negative for visual disturbance.  Respiratory: Negative for shortness of breath.   Cardiovascular: Negative for chest pain and leg swelling.  Gastrointestinal: Negative for nausea, vomiting, abdominal pain, diarrhea and constipation.  Musculoskeletal: Negative for  myalgias and arthralgias.  Skin: Negative for color change and rash.  Hematological: Negative for adenopathy. Does not bruise/bleed easily.  Psychiatric/Behavioral: Negative for sleep disturbance and dysphoric mood. The patient is not nervous/anxious.        Objective:    BP 110/72 mmHg  Pulse 72  Temp(Src) 98.1 F (36.7 C) (Oral)  Ht 4\' 10"  (1.473 m)  Wt 153 lb 8 oz (69.627 kg)  BMI 32.09 kg/m2  SpO2 98% Physical Exam  Constitutional: She is oriented to person, place, and time. She appears well-developed and well-nourished. No distress.  HENT:  Head: Normocephalic and atraumatic.  Right Ear: External ear normal.  Left Ear: External ear normal.  Nose: Nose normal.  Mouth/Throat: Oropharynx is clear and moist. No oropharyngeal exudate.  Eyes: Conjunctivae are normal. Pupils are equal, round, and reactive to light. Right eye exhibits no discharge. Left eye exhibits no discharge. No scleral icterus.  Neck: Normal range of motion. Neck supple. No tracheal deviation present. No thyromegaly present.  Cardiovascular: Normal rate, regular rhythm, normal heart sounds and intact distal pulses.  Exam reveals no gallop and no friction rub.   No murmur heard. Pulmonary/Chest: Effort normal and breath sounds normal. No accessory muscle usage. No tachypnea. No respiratory distress. She has no decreased breath sounds. She has no wheezes.  She has no rhonchi. She has no rales. She exhibits no tenderness.  Musculoskeletal: Normal range of motion. She exhibits no edema or tenderness.  Lymphadenopathy:    She has no cervical adenopathy.  Neurological: She is alert and oriented to person, place, and time. No cranial nerve deficit. She exhibits normal muscle tone. Coordination normal.  Skin: Skin is warm and dry. No rash noted. She is not diaphoretic. No erythema. No pallor.  Psychiatric: She has a normal mood and affect. Her behavior is normal. Judgment and thought content normal.            Assessment & Plan:   Problem List Items Addressed This Visit      Unprioritized   Diabetes mellitus type 2, controlled ([place]) - Primary    BG well controlled by report. Will check A1c with labs.      Relevant Orders   Comprehensive metabolic panel   Hemoglobin A1c   Lipid panel   Microalbumin / creatinine urine ratio   Hypertension    BP Readings from Last 3 Encounters:  05/24/16 110/72  02/22/16 107/77  01/30/16 118/70   BP well controlled. Renal function with labs.      Obesity (BMI 30-39.9)    Seen by NP at bariatric clinic and started on Phentermine. I had discouraged her from using this in the past because of her elevated BP. Discussed potential risks and limitations of this medication. She will follow up with bariatric clinic for any refills on phentermine.      Relevant Medications   phentermine 30 MG capsule       Return in about 3 months (around 08/24/2016) for Recheck of Diabetes.  Ronette Deter, MD Internal Medicine [facility]

## 2016-05-24 NOTE — Assessment & Plan Note (Signed)
Seen by NP at bariatric clinic and started on Phentermine. I had discouraged her from using this in the past because of her elevated BP. Discussed potential risks and limitations of this medication. She will follow up with bariatric clinic for any refills on phentermine.

## 2016-05-24 NOTE — Progress Notes (Signed)
Pre visit review using our clinic review tool, if applicable. No additional management support is needed unless otherwise documented below in the visit note. 

## 2016-05-29 ENCOUNTER — Other Ambulatory Visit: Payer: 59

## 2016-06-04 ENCOUNTER — Other Ambulatory Visit (INDEPENDENT_AMBULATORY_CARE_PROVIDER_SITE_OTHER): Payer: 59

## 2016-06-04 DIAGNOSIS — E119 Type 2 diabetes mellitus without complications: Secondary | ICD-10-CM

## 2016-06-05 LAB — LIPID PANEL
CHOLESTEROL TOTAL: 144 mg/dL (ref 100–199)
Chol/HDL Ratio: 5 ratio units — ABNORMAL HIGH (ref 0.0–4.4)
HDL: 29 mg/dL — ABNORMAL LOW (ref >39)
LDL Calculated: 85 mg/dL (ref 0–99)
Triglycerides: 149 mg/dL (ref 0–149)
VLDL CHOLESTEROL CAL: 30 mg/dL (ref 5–40)

## 2016-06-05 LAB — COMPREHENSIVE METABOLIC PANEL
A/G RATIO: 1.4 (ref 1.2–2.2)
ALK PHOS: 73 IU/L (ref 39–117)
ALT: 27 IU/L (ref 0–32)
AST: 17 IU/L (ref 0–40)
Albumin: 4.3 g/dL (ref 3.6–4.8)
BILIRUBIN TOTAL: 0.3 mg/dL (ref 0.0–1.2)
BUN/Creatinine Ratio: 23 (ref 12–28)
BUN: 14 mg/dL (ref 8–27)
CHLORIDE: 100 mmol/L (ref 96–106)
CO2: 24 mmol/L (ref 18–29)
Calcium: 9.3 mg/dL (ref 8.7–10.3)
Creatinine, Ser: 0.6 mg/dL (ref 0.57–1.00)
GFR calc Af Amer: 114 mL/min/{1.73_m2} (ref >59)
GFR calc non Af Amer: 99 mL/min/{1.73_m2} (ref >59)
GLOBULIN, TOTAL: 3 g/dL (ref 1.5–4.5)
Glucose: 107 mg/dL — ABNORMAL HIGH (ref 65–99)
POTASSIUM: 4.2 mmol/L (ref 3.5–5.2)
SODIUM: 144 mmol/L (ref 134–144)
Total Protein: 7.3 g/dL (ref 6.0–8.5)

## 2016-06-05 LAB — HEMOGLOBIN A1C
ESTIMATED AVERAGE GLUCOSE: 128 mg/dL
HEMOGLOBIN A1C: 6.1 % — AB (ref 4.8–5.6)

## 2016-06-05 LAB — MICROALBUMIN / CREATININE URINE RATIO
Creatinine, Urine: 128.1 mg/dL
MICROALB/CREAT RATIO: 11.9 mg/g{creat} (ref 0.0–30.0)
MICROALBUM., U, RANDOM: 15.2 ug/mL

## 2016-06-17 ENCOUNTER — Other Ambulatory Visit: Payer: Self-pay | Admitting: Internal Medicine

## 2016-07-17 ENCOUNTER — Other Ambulatory Visit: Payer: Self-pay | Admitting: Internal Medicine

## 2016-08-09 ENCOUNTER — Ambulatory Visit: Payer: 59 | Admitting: Internal Medicine

## 2016-08-30 ENCOUNTER — Ambulatory Visit: Payer: 59 | Admitting: Internal Medicine

## 2016-09-17 ENCOUNTER — Ambulatory Visit: Payer: 59 | Admitting: Family

## 2016-10-17 ENCOUNTER — Other Ambulatory Visit: Payer: Self-pay | Admitting: Family Medicine

## 2016-10-18 ENCOUNTER — Encounter: Payer: Self-pay | Admitting: Family Medicine

## 2016-10-18 ENCOUNTER — Ambulatory Visit (INDEPENDENT_AMBULATORY_CARE_PROVIDER_SITE_OTHER): Payer: 59 | Admitting: Family Medicine

## 2016-10-18 VITALS — BP 132/84 | HR 100 | Temp 98.3°F | Wt 153.2 lb

## 2016-10-18 DIAGNOSIS — E119 Type 2 diabetes mellitus without complications: Secondary | ICD-10-CM | POA: Diagnosis not present

## 2016-10-18 DIAGNOSIS — G8929 Other chronic pain: Secondary | ICD-10-CM

## 2016-10-18 DIAGNOSIS — H9311 Tinnitus, right ear: Secondary | ICD-10-CM

## 2016-10-18 DIAGNOSIS — K219 Gastro-esophageal reflux disease without esophagitis: Secondary | ICD-10-CM | POA: Diagnosis not present

## 2016-10-18 DIAGNOSIS — M545 Low back pain: Secondary | ICD-10-CM

## 2016-10-18 DIAGNOSIS — E669 Obesity, unspecified: Secondary | ICD-10-CM | POA: Diagnosis not present

## 2016-10-18 DIAGNOSIS — I10 Essential (primary) hypertension: Secondary | ICD-10-CM

## 2016-10-18 MED ORDER — LOSARTAN POTASSIUM-HCTZ 100-12.5 MG PO TABS: 1.0000 | ORAL_TABLET | Freq: Every day | ORAL | 3 refills | Status: DC

## 2016-10-18 MED ORDER — FLUTICASONE PROPIONATE 50 MCG/ACT NA SUSP: 2.0000 | Freq: Every day | NASAL | 3 refills | Status: DC

## 2016-10-18 MED ORDER — MONTELUKAST SODIUM 10 MG PO TABS: 10.0000 mg | ORAL_TABLET | Freq: Every day | ORAL | 3 refills | Status: DC

## 2016-10-18 MED ORDER — OMEPRAZOLE 40 MG PO CPDR: 40.0000 mg | DELAYED_RELEASE_CAPSULE | Freq: Every day | ORAL | 3 refills | Status: DC

## 2016-10-18 MED ORDER — METFORMIN HCL 500 MG PO TABS: 500.0000 mg | ORAL_TABLET | Freq: Every day | ORAL | 3 refills | Status: DC

## 2016-10-18 NOTE — Assessment & Plan Note (Signed)
Chronic, stable. Continue current regimen. 

## 2016-10-18 NOTE — Assessment & Plan Note (Signed)
Chronic, stable. Endorses good sugar control. Continue metformin. Encouraged she schedule eye exam and get pneumovax.

## 2016-10-18 NOTE — Patient Instructions (Addendum)
Change flonase to other nasal steroid like rhinocort or nasacor (OTC).  Think about pneumonia shot You are doing well today. Continue current medicines Return for fasting labs and physical 3-4 months.

## 2016-10-18 NOTE — Progress Notes (Signed)
BP 132/84   Pulse 100   Temp 98.3 F (36.8 C) (Oral)   Wt 153 lb 4 oz (69.5 kg)   BMI 32.03 kg/m    CC: transfer of care from Dr Gilford Rile Subjective:    Patient ID: Olivia Benton, female    DOB: 1956/10/02, 60 y.o.   MRN: [id number]  HPI: Olivia Benton is a 60 y.o. female presenting on 10/18/2016 for Establish Care (Transfer from Dr. Gilford Rile)   Stopped aspirin due to tinnitus. She hsa been on antibiotics recently for serous otitis. Amox then augmentin. Mild headache but no earache, congestion. She is taking flonase daily. No vertigo or nausea/vomiting. No hearing changes.   H/o 2 back surgeries - on soma and vicodin prescribed by Dr Carloyn Manner. Initial surgery at age 59 yo for scoliosis.   Obesity - on phentermine by bariatric clinic.   GERD - controlled with omeprazole 40mg  daily. Breakthrough sxs if missed dose.   DM - regularly does not check sugars - today 111 random. Compliant with antihyperglycemic regimen which includes: metformin 500mg  daily. Denies low sugars or hypoglycemic symptoms. R hand paresthesia - due to CTS. Last diabetic eye exam 03/2014.  Pneumovax: due.  Prevnar: not due. Lab Results  Component Value Date   HGBA1C 6.1 (H) 06/04/2016   Diabetic Foot Exam - Simple   Simple Foot Form Diabetic Foot exam was performed with the following findings:  Yes 10/18/2016  4:03 PM  Visual Inspection No deformities, no ulcerations, no other skin breakdown bilaterally:  Yes Sensation Testing Intact to touch and monofilament testing bilaterally:  Yes Pulse Check See comments:  Yes Comments Diminished DP bilaterally      HTN - Compliant with current antihypertensive regimen of hyzaar daily.  Does not check blood pressures at home. No low blood pressure readings or symptoms of dizziness/syncope. Denies HA, vision changes, CP/tightness, SOB, leg swelling.    Relevant past medical, surgical, family and social history reviewed and updated as indicated. Interim medical  history since our last visit reviewed. Allergies and medications reviewed and updated. Current Outpatient Prescriptions on File Prior to Visit  Medication Sig  . aspirin 81 MG tablet Take 81 mg by mouth daily.    . carisoprodol (SOMA) 350 MG tablet QHS PRN - reported on 05/24/2016  . cholecalciferol (VITAMIN D) 1000 UNITS tablet Take 1,000 Units by mouth daily.  Marland Kitchen desonide (DESOWEN) 0.05 % cream Apply topically 2 (two) times daily.  Marland Kitchen glucose blood (BAYER CONTOUR NEXT TEST) test strip Use as instructed to check blood glucose daily E11.9  . Hydrocodone-Acetaminophen 5-300 MG TABS as needed.   . Multiple Minerals-Vitamins (CALCIUM & VIT D3 BONE HEALTH PO) Take by mouth daily.  Marland Kitchen tretinoin (RETIN-A) 0.05 % cream Apply 1 application topically at bedtime.  . vitamin C (ASCORBIC ACID) 500 MG tablet Take 500 mg by mouth daily.   No current facility-administered medications on file prior to visit.     Review of Systems Per HPI unless specifically indicated in ROS section     Objective:    BP 132/84   Pulse 100   Temp 98.3 F (36.8 C) (Oral)   Wt 153 lb 4 oz (69.5 kg)   BMI 32.03 kg/m   Wt Readings from Last 3 Encounters:  10/18/16 153 lb 4 oz (69.5 kg)  05/24/16 153 lb 8 oz (69.6 kg)  02/22/16 160 lb (72.6 kg)    Physical Exam  Constitutional: She appears well-developed and well-nourished. No distress.  HENT:  Head: Normocephalic and atraumatic.  Right Ear: External ear normal.  Left Ear: External ear normal.  Nose: Nose normal.  Mouth/Throat: Oropharynx is clear and moist. No oropharyngeal exudate.  Congested TMs bilaterally  Eyes: Conjunctivae and EOM are normal. Pupils are equal, round, and reactive to light. No scleral icterus.  Neck: Normal range of motion. Neck supple.  Cardiovascular: Normal rate, regular rhythm, normal heart sounds and intact distal pulses.   No murmur heard. Pulmonary/Chest: Effort normal and breath sounds normal. No respiratory distress. She has no  wheezes. She has no rales.  Musculoskeletal: She exhibits no edema.  See HPI for foot exam if done  Lymphadenopathy:    She has no cervical adenopathy.  Skin: Skin is warm and dry. No rash noted.  Psychiatric: She has a normal mood and affect.  Nursing note and vitals reviewed.  Results for orders placed or performed in visit on 10/18/16  HM DIABETES EYE EXAM  Result Value Ref Range   HM Diabetic Eye Exam No Retinopathy No Retinopathy   Lab Results  Component Value Date   TSH 0.75 03/01/2015      Assessment & Plan:   Problem List Items Addressed This Visit    Chronic low back pain    Soma, hydrocodone prescribed by neurosurgery Dr Carloyn Manner.      Diabetes mellitus type 2, controlled ([place]) - Primary    Chronic, stable. Endorses good sugar control. Continue metformin. Encouraged she schedule eye exam and get pneumovax.      Relevant Medications   losartan-hydrochlorothiazide (HYZAAR) 100-12.5 MG tablet   metFORMIN (GLUCOPHAGE) 500 MG tablet   GERD (gastroesophageal reflux disease)    Continue PPI daily. Discussed trial QOD dosing.      Relevant Medications   omeprazole (PRILOSEC) 40 MG capsule   Hypertension    Chronic, stable. Continue current regimen.      Relevant Medications   losartan-hydrochlorothiazide (HYZAAR) 100-12.5 MG tablet   Obesity (BMI 30-39.9)    Pt currently followed by bariatric clinic in [place], receives phentermine through them.      Relevant Medications   phentermine 37.5 MG capsule   metFORMIN (GLUCOPHAGE) 500 MG tablet   Right-sided tinnitus    S/p 2 abx courses. Suggested change INS. Update if persistent trouble.       Other Visit Diagnoses   None.      Follow up plan: Return in about 3 months (around 01/18/2017) for annual exam, prior fasting for blood work.  Ria Bush, MD

## 2016-10-18 NOTE — Assessment & Plan Note (Signed)
Soma, hydrocodone prescribed by neurosurgery Dr Carloyn Manner.

## 2016-10-18 NOTE — Assessment & Plan Note (Signed)
Continue PPI daily. Discussed trial QOD dosing.

## 2016-10-18 NOTE — Assessment & Plan Note (Signed)
Pt currently followed by bariatric clinic in [place], receives phentermine through them.

## 2016-10-18 NOTE — Progress Notes (Signed)
Pre visit review using our clinic review tool, if applicable. No additional management support is needed unless otherwise documented below in the visit note. 

## 2016-10-18 NOTE — Assessment & Plan Note (Signed)
S/p 2 abx courses. Suggested change INS. Update if persistent trouble.

## 2016-10-19 ENCOUNTER — Other Ambulatory Visit: Payer: Self-pay | Admitting: Family Medicine

## 2016-10-19 DIAGNOSIS — Z1231 Encounter for screening mammogram for malignant neoplasm of breast: Secondary | ICD-10-CM

## 2016-11-06 ENCOUNTER — Ambulatory Visit (INDEPENDENT_AMBULATORY_CARE_PROVIDER_SITE_OTHER): Payer: 59 | Admitting: Family Medicine

## 2016-11-06 ENCOUNTER — Encounter: Payer: Self-pay | Admitting: Family Medicine

## 2016-11-06 VITALS — BP 108/80 | HR 103 | Wt 153.0 lb

## 2016-11-06 DIAGNOSIS — G5603 Carpal tunnel syndrome, bilateral upper limbs: Secondary | ICD-10-CM | POA: Diagnosis not present

## 2016-11-06 DIAGNOSIS — H61891 Other specified disorders of right external ear: Secondary | ICD-10-CM

## 2016-11-06 HISTORY — DX: Carpal tunnel syndrome, bilateral upper limbs: G56.03

## 2016-11-06 NOTE — Patient Instructions (Signed)
We will refer you to hand doctor for carpal tunnel of predominantly right side Use mineral oil a few drops a few times a week to right ear - remind me to recheck this next visit.

## 2016-11-06 NOTE — Progress Notes (Signed)
BP 108/80   Pulse (!) 103   Wt 153 lb (69.4 kg)   SpO2 97%   BMI 31.98 kg/m    CC: CTS referral Subjective:    Patient ID: [name], female    DOB: 08-Aug-1956, 60 y.o.   MRN: [id number]  HPI: SHANAVIA CROWDEN is a 60 y.o. female presenting on 11/06/2016 for Carpal Tunnel   R handed  Worsening R>L CTS over last several years. Waking up with entire hand numbness. Pain radiates to R shoulder. Denies neck pain. Wears upper arm brace. Wears R wrist copper brace to sleep as well. Thumb spica brace worsened pain.   CTS was diagnosed by [facility] 14 yrs ago (by Neurologist at [facility], pt does not have records available) - pt declined surgery at that time.   Relevant past medical, surgical, family and social history reviewed and updated as indicated. Interim medical history since our last visit reviewed. Allergies and medications reviewed and updated. Current Outpatient Prescriptions on File Prior to Visit  Medication Sig  . aspirin 81 MG tablet Take 81 mg by mouth daily.    . carisoprodol (SOMA) 350 MG tablet QHS PRN - reported on 05/24/2016  . cholecalciferol (VITAMIN D) 1000 UNITS tablet Take 1,000 Units by mouth daily.  Marland Kitchen desonide (DESOWEN) 0.05 % cream Apply topically 2 (two) times daily.  . fluticasone (FLONASE) 50 MCG/ACT nasal spray Place 2 sprays into both nostrils daily.  Marland Kitchen glucose blood (BAYER CONTOUR NEXT TEST) test strip Use as instructed to check blood glucose daily E11.9  . Hydrocodone-Acetaminophen 5-300 MG TABS as needed.   Marland Kitchen levocetirizine (XYZAL) 5 MG tablet Take 5 mg by mouth every evening.  Marland Kitchen losartan-hydrochlorothiazide (HYZAAR) 100-12.5 MG tablet Take 1 tablet by mouth daily.  . metFORMIN (GLUCOPHAGE) 500 MG tablet Take 1 tablet (500 mg total) by mouth daily.  . montelukast (SINGULAIR) 10 MG tablet Take 1 tablet (10 mg total) by mouth at bedtime.  . Multiple Minerals-Vitamins (CALCIUM & VIT D3 BONE HEALTH PO) Take by mouth daily.  Marland Kitchen omeprazole (PRILOSEC)  40 MG capsule Take 1 capsule (40 mg total) by mouth daily.  . phentermine 37.5 MG capsule Take 37.5 mg by mouth every morning. Per bariatric clinic  . tretinoin (RETIN-A) 0.05 % cream Apply 1 application topically at bedtime.  . vitamin C (ASCORBIC ACID) 500 MG tablet Take 500 mg by mouth daily.   No current facility-administered medications on file prior to visit.    Past Medical History:  Diagnosis Date  . Asthma    extrinsic  . Bilateral carpal tunnel syndrome 11/06/2016  . Diabetes mellitus    non-insulin dependent  . Foot fracture   . History of deep vein thrombosis (DVT) of lower extremity 1874   after back surgery while on OCP  . Hyperlipidemia   . Hypertension   . Rhinitis     Past Surgical History:  Procedure Laterality Date  . BACK SURGERY  2010   hardware - Dr. Carloyn Manner  . BACK SURGERY  1974   scoliosis  . BREAST BIOPSY Left 09/2011   Dr. Tamala Julian  . TOTAL ABDOMINAL HYSTERECTOMY  1991   endometriosis    Family History  Problem Relation Age of Onset  . Hypertension Mother   . Heart disease Maternal Grandmother     pacemaker  . Cancer Maternal Grandfather     liver  . Leukemia Paternal Grandmother     Social History  Substance Use Topics  . Smoking status: Former  Smoker    Quit date: 10/17/2009  . Smokeless tobacco: Never Used  . Alcohol use No    Review of Systems Per HPI unless specifically indicated in ROS section     Objective:    BP 108/80   Pulse (!) 103   Wt 153 lb (69.4 kg)   SpO2 97%   BMI 31.98 kg/m   Wt Readings from Last 3 Encounters:  11/06/16 153 lb (69.4 kg)  10/18/16 153 lb 4 oz (69.5 kg)  05/24/16 153 lb 8 oz (69.6 kg)    BP Readings from Last 3 Encounters:  11/06/16 108/80  10/18/16 132/84  05/24/16 110/72    Physical Exam  Constitutional: She is oriented to person, place, and time. She appears well-developed and well-nourished. No distress.  HENT:  Right Ear: Hearing, tympanic membrane and external ear normal.  Left Ear:  Hearing, tympanic membrane, external ear and ear canal normal.  Darker wax ?scab at base of R canal - unable to clear with plastic curette, tender with mild surrounding erythema.   Musculoskeletal: Normal range of motion. She exhibits no edema.  FROM at elbows 2+ rad pulses bilaterally  Neurological: She is alert and oriented to person, place, and time. No cranial nerve deficit.  + tinel and phalen on right Diminished temperature sensation at right palm Sensation to light touch intact BUE BUE strength 5/5   Nursing note and vitals reviewed.     Assessment & Plan:   Problem List Items Addressed This Visit    Bilateral carpal tunnel syndrome - Primary    R>L. Longstanding diagnosis. Failed conservative treatment. Will refer to hand for further management/treatment.       Relevant Orders   Ambulatory referral to Hand Surgery   Irritation of external ear canal, right    Dry patch of dark wax ?scab base of R ear canal - possible source of irritation - rec tx with drops of mineral oil for next few weeks. Pt will let me know if persistent irritation.          Follow up plan: No Follow-up on file.  Ria Bush, MD

## 2016-11-06 NOTE — Assessment & Plan Note (Signed)
R>L. Longstanding diagnosis. Failed conservative treatment. Will refer to hand for further management/treatment.

## 2016-11-06 NOTE — Assessment & Plan Note (Signed)
Dry patch of dark wax ?scab base of R ear canal - possible source of irritation - rec tx with drops of mineral oil for next few weeks. Pt will let me know if persistent irritation.

## 2016-11-21 ENCOUNTER — Ambulatory Visit
Admission: RE | Admit: 2016-11-21 | Discharge: 2016-11-21 | Disposition: A | Discharge status: Home / Self Care | Payer: 59 | Source: Ambulatory Visit | Attending: Family Medicine | Admitting: Family Medicine

## 2016-11-21 DIAGNOSIS — Z1231 Encounter for screening mammogram for malignant neoplasm of breast: Secondary | ICD-10-CM

## 2016-11-21 LAB — HM MAMMOGRAPHY

## 2016-11-26 ENCOUNTER — Encounter: Payer: Self-pay | Admitting: *Deleted

## 2016-12-12 ENCOUNTER — Ambulatory Visit (INDEPENDENT_AMBULATORY_CARE_PROVIDER_SITE_OTHER): Payer: 59 | Admitting: Family Medicine

## 2016-12-12 ENCOUNTER — Encounter: Payer: Self-pay | Admitting: Family Medicine

## 2016-12-12 ENCOUNTER — Ambulatory Visit: Payer: 59 | Admitting: Family Medicine

## 2016-12-12 VITALS — BP 128/82 | HR 72 | Temp 98.2°F | Wt 157.5 lb

## 2016-12-12 DIAGNOSIS — J019 Acute sinusitis, unspecified: Secondary | ICD-10-CM

## 2016-12-12 DIAGNOSIS — B9689 Other specified bacterial agents as the cause of diseases classified elsewhere: Secondary | ICD-10-CM | POA: Insufficient documentation

## 2016-12-12 MED ORDER — DOXYCYCLINE HYCLATE 100 MG PO TABS: 100.0000 mg | ORAL_TABLET | Freq: Two times a day (BID) | ORAL | 0 refills | Status: DC

## 2016-12-12 MED ORDER — GUAIFENESIN-CODEINE 100-10 MG/5ML PO SYRP: 5.0000 mL | ORAL_SOLUTION | Freq: Two times a day (BID) | ORAL | 0 refills | Status: DC | PRN

## 2016-12-12 MED ORDER — ALBUTEROL SULFATE HFA 108 (90 BASE) MCG/ACT IN AERS: 2.0000 | INHALATION_SPRAY | Freq: Four times a day (QID) | RESPIRATORY_TRACT | 3 refills | Status: DC | PRN

## 2016-12-12 NOTE — Progress Notes (Signed)
Pre visit review using our clinic review tool, if applicable. No additional management support is needed unless otherwise documented below in the visit note. 

## 2016-12-12 NOTE — Progress Notes (Signed)
BP 128/82   Pulse 72   Temp 98.2 F (36.8 C) (Oral)   Wt 157 lb 8 oz (71.4 kg)   SpO2 96%   BMI 32.92 kg/m    CC: f/u [facility] visit Subjective:    Patient ID: Olivia Benton, female    DOB: 1956/09/14, 60 y.o.   MRN: [id number]  HPI: [name] is a 60 y.o. female presenting on 12/12/2016 for URI (cough, congestion; went to [facility])   9d h/o ST with PNDrainage, headache, cough, congestion. ST improved. Blowing nose with purulent mucous. Some pressure around face. Some orthopnea. Feeling wheezy and dyspneic.  No fevers/chills, ear or tooth pain, new leg swelling.  Seen at [facility] - treated with tessalon perls (not effective) and amoxicillin - which she did not otlerate caused significant indigestion and burning. rec mucinex D. Also using nasal saline.   No smokers around. Sick contacts at work.  H/o asthma - overall well controlled. She has albuterol inhaler - which expired 06/2016.   Relevant past medical, surgical, family and social history reviewed and updated as indicated. Interim medical history since our last visit reviewed. Allergies and medications reviewed and updated. Current Outpatient Prescriptions on File Prior to Visit  Medication Sig  . aspirin 81 MG tablet Take 81 mg by mouth daily.    . carisoprodol (SOMA) 350 MG tablet QHS PRN - reported on 05/24/2016  . cholecalciferol (VITAMIN D) 1000 UNITS tablet Take 1,000 Units by mouth daily.  Marland Kitchen desonide (DESOWEN) 0.05 % cream Apply topically 2 (two) times daily.  . fluticasone (FLONASE) 50 MCG/ACT nasal spray Place 2 sprays into both nostrils daily.  Marland Kitchen glucose blood (BAYER CONTOUR NEXT TEST) test strip Use as instructed to check blood glucose daily E11.9  . Hydrocodone-Acetaminophen 5-300 MG TABS as needed.   Marland Kitchen levocetirizine (XYZAL) 5 MG tablet Take 5 mg by mouth every evening.  Marland Kitchen losartan-hydrochlorothiazide (HYZAAR) 100-12.5 MG tablet Take 1 tablet by mouth daily.  . metFORMIN (GLUCOPHAGE) 500 MG tablet  Take 1 tablet (500 mg total) by mouth daily.  . montelukast (SINGULAIR) 10 MG tablet Take 1 tablet (10 mg total) by mouth at bedtime.  . Multiple Minerals-Vitamins (CALCIUM & VIT D3 BONE HEALTH PO) Take by mouth daily.  Marland Kitchen omeprazole (PRILOSEC) 40 MG capsule Take 1 capsule (40 mg total) by mouth daily.  . TOPICORT 0.25 % LIQD Apply 0.25 sprays topically daily.  Marland Kitchen tretinoin (RETIN-A) 0.05 % cream Apply 1 application topically at bedtime.  . vitamin C (ASCORBIC ACID) 500 MG tablet Take 500 mg by mouth daily.  . phentermine 37.5 MG capsule Take 37.5 mg by mouth every morning. Per bariatric clinic   No current facility-administered medications on file prior to visit.     Review of Systems Per HPI unless specifically indicated in ROS section     Objective:    BP 128/82   Pulse 72   Temp 98.2 F (36.8 C) (Oral)   Wt 157 lb 8 oz (71.4 kg)   SpO2 96%   BMI 32.92 kg/m   Wt Readings from Last 3 Encounters:  12/12/16 157 lb 8 oz (71.4 kg)  11/06/16 153 lb (69.4 kg)  10/18/16 153 lb 4 oz (69.5 kg)    Physical Exam  Constitutional: She appears well-developed and well-nourished. No distress.  HENT:  Head: Normocephalic and atraumatic.  Right Ear: Hearing, tympanic membrane, external ear and ear canal normal.  Left Ear: Hearing, tympanic membrane, external ear and ear canal normal.  Nose:  No mucosal edema or rhinorrhea. Right sinus exhibits no maxillary sinus tenderness and no frontal sinus tenderness. Left sinus exhibits no maxillary sinus tenderness and no frontal sinus tenderness.  Mouth/Throat: Uvula is midline, oropharynx is clear and moist and mucous membranes are normal. No oropharyngeal exudate, posterior oropharyngeal edema, posterior oropharyngeal erythema or tonsillar abscesses.  Nasal mucosal dryness/irritation  Eyes: Conjunctivae and EOM are normal. Pupils are equal, round, and reactive to light. No scleral icterus.  Neck: Normal range of motion. Neck supple.  Cardiovascular:  Normal rate, regular rhythm, normal heart sounds and intact distal pulses.   No murmur heard. Pulmonary/Chest: Effort normal and breath sounds normal. No respiratory distress. She has no wheezes. She has no rales.  Faint crackles lower lobes  Lymphadenopathy:    She has no cervical adenopathy.  Skin: Skin is warm and dry. No rash noted.  Nursing note and vitals reviewed.  Results for orders placed or performed in visit on 11/26/16  HM MAMMOGRAPHY  Result Value Ref Range   HM Mammogram 0-4 Bi-Rad 0-4 Bi-Rad, Self Reported Normal   Lab Results  Component Value Date   HGBA1C 6.1 (H) 06/04/2016       Assessment & Plan:   Problem List Items Addressed This Visit    Acute bacterial sinusitis - Primary    Anticipate bacterial sinusitis not resolved with amox course (not tolerating well). Change abx to doxy 10d course. cheratussin for cough. Further supportive care as per instructions. Pt agrees with plan.      Relevant Medications   pseudoephedrine-guaifenesin (MUCINEX D) 60-600 MG 12 hr tablet   doxycycline (VIBRA-TABS) 100 MG tablet   guaiFENesin-codeine (CHERATUSSIN AC) 100-10 MG/5ML syrup       Follow up plan: No Follow-up on file.  Ria Bush, MD

## 2016-12-12 NOTE — Assessment & Plan Note (Signed)
Anticipate bacterial sinusitis not resolved with amox course (not tolerating well). Change abx to doxy 10d course. cheratussin for cough. Further supportive care as per instructions. Pt agrees with plan.

## 2016-12-12 NOTE — Patient Instructions (Signed)
You have a sinus infection. Take medicine as prescribed: doxy 10 day course Push fluids and plenty of rest. Codeine cough syrup for cough.  Nasal saline irrigation or neti pot to help drain sinuses. May use plain mucinex with plenty of fluid to help mobilize mucous. Please let us know if fever >101.5, trouble opening/closing mouth, difficulty swallowing, or worsening instead of improving as expected.

## 2016-12-14 ENCOUNTER — Other Ambulatory Visit: Payer: Self-pay | Admitting: Family Medicine

## 2016-12-14 NOTE — Telephone Encounter (Signed)
Ok to fill in Dr. Synthia Innocent absence? Given abx yesterday.

## 2016-12-14 NOTE — Telephone Encounter (Signed)
Sent. Thanks.   

## 2016-12-26 DIAGNOSIS — G5601 Carpal tunnel syndrome, right upper limb: Secondary | ICD-10-CM | POA: Diagnosis not present

## 2016-12-31 ENCOUNTER — Ambulatory Visit (INDEPENDENT_AMBULATORY_CARE_PROVIDER_SITE_OTHER): Payer: 59 | Admitting: Family Medicine

## 2016-12-31 ENCOUNTER — Encounter: Payer: Self-pay | Admitting: *Deleted

## 2016-12-31 ENCOUNTER — Ambulatory Visit (INDEPENDENT_AMBULATORY_CARE_PROVIDER_SITE_OTHER)
Admission: RE | Admit: 2016-12-31 | Discharge: 2016-12-31 | Disposition: A | Discharge status: Home / Self Care | Payer: 59 | Source: Ambulatory Visit | Attending: Family Medicine | Admitting: Family Medicine

## 2016-12-31 ENCOUNTER — Encounter: Payer: Self-pay | Admitting: Family Medicine

## 2016-12-31 VITALS — BP 134/84 | HR 96 | Temp 98.9°F | Wt 157.5 lb

## 2016-12-31 DIAGNOSIS — E119 Type 2 diabetes mellitus without complications: Secondary | ICD-10-CM | POA: Diagnosis not present

## 2016-12-31 DIAGNOSIS — J189 Pneumonia, unspecified organism: Secondary | ICD-10-CM | POA: Diagnosis not present

## 2016-12-31 DIAGNOSIS — J019 Acute sinusitis, unspecified: Secondary | ICD-10-CM

## 2016-12-31 DIAGNOSIS — R05 Cough: Secondary | ICD-10-CM | POA: Diagnosis not present

## 2016-12-31 DIAGNOSIS — B9689 Other specified bacterial agents as the cause of diseases classified elsewhere: Secondary | ICD-10-CM

## 2016-12-31 HISTORY — DX: Pneumonia, unspecified organism: J18.9

## 2016-12-31 MED ORDER — PREDNISONE 20 MG PO TABS: 40.0000 mg | ORAL_TABLET | Freq: Every day | ORAL | 0 refills | Status: DC

## 2016-12-31 MED ORDER — LEVOFLOXACIN 500 MG PO TABS: 500.0000 mg | ORAL_TABLET | Freq: Every day | ORAL | 0 refills | Status: DC

## 2016-12-31 NOTE — Progress Notes (Signed)
Pre visit review using our clinic review tool, if applicable. No additional management support is needed unless otherwise documented below in the visit note. 

## 2016-12-31 NOTE — Patient Instructions (Addendum)
I think you have developed pneumonia despite doxycycline treatment. Treat with levaquin course x 7 days. Take short prednisone burst x 5 days. Xray today.  Let us know if not improving with treatment.

## 2016-12-31 NOTE — Progress Notes (Signed)
BP 134/84   Pulse 96   Temp 98.9 F (37.2 C) (Oral)   Wt 157 lb 8 oz (71.4 kg)   SpO2 97%   BMI 32.92 kg/m    CC: recheck Subjective:    Patient ID: Olivia Benton, female    DOB: 03-03-1956, 61 y.o.   MRN: [id number]  HPI: Olivia Benton is a 61 y.o. female presenting on 12/31/2016 for Follow-up (recheck-worsening)   See prior note for details. Seen last month with acute sinusitis, treated with amoxicillin at [facility] then doxycycline here. Also placed on codeine cough syrup. Never any better. Worse cough with low grade fevers at night time, trouble laying down flat, more wheezy and short winded. Epistaxis. + fatigue and body aches. Head congestion and chest congestion. Symptoms ongoing for over a month.   H/o asthma - overall well controlled. She has been using albuterol inhaler.  She eats yogurt daily, has probiotic she can use.   Has been through 2 boxes of mucinex D also taking alegra D.  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   Relevant past medical, surgical, family and social history reviewed and updated as indicated. Interim medical history since our last visit reviewed. Allergies and medications reviewed and updated. Current Outpatient Prescriptions on File Prior to Visit  Medication Sig  . albuterol (PROVENTIL HFA;VENTOLIN HFA) 108 (90 Base) MCG/ACT inhaler Inhale 2 puffs into the lungs every 6 (six) hours as needed for wheezing or shortness of breath.  Marland Kitchen aspirin 81 MG tablet Take 81 mg by mouth daily.    . carisoprodol (SOMA) 350 MG tablet QHS PRN - reported on 05/24/2016  . cholecalciferol (VITAMIN D) 1000 UNITS tablet Take 1,000 Units by mouth daily.  Marland Kitchen desonide (DESOWEN) 0.05 % cream Apply topically 2 (two) times daily.  . fluconazole (DIFLUCAN) 150 MG tablet take 1 tablet by mouth ONCE. MAY REPEAT IN 3 DAYS.  . fluticasone (FLONASE) 50 MCG/ACT nasal spray Place 2 sprays into both nostrils daily.  Marland Kitchen glucose blood (BAYER CONTOUR NEXT TEST) test strip Use as instructed to check  blood glucose daily E11.9  . guaiFENesin-codeine (CHERATUSSIN AC) 100-10 MG/5ML syrup Take 5 mLs by mouth 2 (two) times daily as needed for cough (sedation precautions).  . Hydrocodone-Acetaminophen 5-300 MG TABS as needed.   Marland Kitchen levocetirizine (XYZAL) 5 MG tablet Take 5 mg by mouth every evening.  Marland Kitchen losartan-hydrochlorothiazide (HYZAAR) 100-12.5 MG tablet Take 1 tablet by mouth daily.  . metFORMIN (GLUCOPHAGE) 500 MG tablet Take 1 tablet (500 mg total) by mouth daily.  . montelukast (SINGULAIR) 10 MG tablet Take 1 tablet (10 mg total) by mouth at bedtime.  . Multiple Minerals-Vitamins (CALCIUM & VIT D3 BONE HEALTH PO) Take by mouth daily.  Marland Kitchen omeprazole (PRILOSEC) 40 MG capsule Take 1 capsule (40 mg total) by mouth daily.  . pseudoephedrine-guaifenesin (MUCINEX D) 60-600 MG 12 hr tablet Take 1 tablet by mouth every 12 (twelve) hours.  . TOPICORT 0.25 % LIQD Apply 0.25 sprays topically daily.  Marland Kitchen tretinoin (RETIN-A) 0.05 % cream Apply 1 application topically at bedtime.  . vitamin C (ASCORBIC ACID) 500 MG tablet Take 500 mg by mouth daily.   No current facility-administered medications on file prior to visit.     Review of Systems Per HPI unless specifically indicated in ROS section     Objective:    BP 134/84   Pulse 96   Temp 98.9 F (37.2 C) (Oral)   Wt 157 lb 8 oz (71.4 kg)   SpO2  97%   BMI 32.92 kg/m   Wt Readings from Last 3 Encounters:  12/31/16 157 lb 8 oz (71.4 kg)  12/12/16 157 lb 8 oz (71.4 kg)  11/06/16 153 lb (69.4 kg)    Physical Exam  Constitutional: She appears well-developed and well-nourished. No distress.  HENT:  Head: Normocephalic and atraumatic.  Right Ear: Hearing, tympanic membrane, external ear and ear canal normal.  Left Ear: Hearing, tympanic membrane, external ear and ear canal normal.  Nose: No mucosal edema or rhinorrhea. Right sinus exhibits no maxillary sinus tenderness and no frontal sinus tenderness. Left sinus exhibits no maxillary sinus  tenderness and no frontal sinus tenderness.  Mouth/Throat: Uvula is midline, oropharynx is clear and moist and mucous membranes are normal. No oropharyngeal exudate, posterior oropharyngeal edema, posterior oropharyngeal erythema or tonsillar abscesses.  Eyes: Conjunctivae and EOM are normal. Pupils are equal, round, and reactive to light. No scleral icterus.  Neck: Normal range of motion. Neck supple.  Cardiovascular: Normal rate, regular rhythm, normal heart sounds and intact distal pulses.   No murmur heard. Pulmonary/Chest: Effort normal. No respiratory distress. She has no decreased breath sounds. She has no wheezes. She has no rhonchi. She has rales (faint rales, bibasilar crackles present).  Ongoing cough present  Lymphadenopathy:    She has no cervical adenopathy.  Skin: Skin is warm and dry. No rash noted.  Nursing note and vitals reviewed.  Results for orders placed or performed in visit on 11/26/16  HM MAMMOGRAPHY  Result Value Ref Range   HM Mammogram 0-4 Bi-Rad 0-4 Bi-Rad, Self Reported Normal   Lab Results  Component Value Date   HGBA1C 6.1 (H) 06/04/2016       Assessment & Plan:   Problem List Items Addressed This Visit    Acute bacterial sinusitis   Relevant Medications   levofloxacin (LEVAQUIN) 500 MG tablet   predniSONE (DELTASONE) 20 MG tablet   Community acquired pneumonia - Primary    Initial acute sinusitis that may have progressed to CAP by clinical exam and story today. Check CXR today. Treat with short prendisone burst and levaquin 7d antibiotic course. Out of work x 3 days. Update if no improvement with treatment. Pt agrees with plan.      Relevant Medications   levofloxacin (LEVAQUIN) 500 MG tablet   Other Relevant Orders   DG Chest 2 View   Diabetes mellitus type 2, controlled ([place])    Will need to closely monitor sugars while on prednisone.          Follow up plan: No Follow-up on file.  Ria Bush, MD

## 2016-12-31 NOTE — Assessment & Plan Note (Signed)
Will need to closely monitor sugars while on prednisone.

## 2016-12-31 NOTE — Assessment & Plan Note (Signed)
Initial acute sinusitis that may have progressed to CAP by clinical exam and story today. Check CXR today. Treat with short prendisone burst and levaquin 7d antibiotic course. Out of work x 3 days. Update if no improvement with treatment. Pt agrees with plan.

## 2017-01-10 ENCOUNTER — Ambulatory Visit: Payer: 59 | Admitting: Family Medicine

## 2017-01-28 DIAGNOSIS — G5601 Carpal tunnel syndrome, right upper limb: Secondary | ICD-10-CM | POA: Diagnosis not present

## 2017-02-06 ENCOUNTER — Other Ambulatory Visit: Payer: Self-pay | Admitting: Family Medicine

## 2017-02-06 ENCOUNTER — Encounter: Payer: Self-pay | Admitting: Family Medicine

## 2017-02-06 ENCOUNTER — Ambulatory Visit (INDEPENDENT_AMBULATORY_CARE_PROVIDER_SITE_OTHER): Payer: 59 | Admitting: Family Medicine

## 2017-02-06 VITALS — BP 136/72 | HR 84 | Temp 98.1°F | Ht <= 58 in | Wt 159.0 lb

## 2017-02-06 DIAGNOSIS — E119 Type 2 diabetes mellitus without complications: Secondary | ICD-10-CM | POA: Diagnosis not present

## 2017-02-06 DIAGNOSIS — Z0000 Encounter for general adult medical examination without abnormal findings: Secondary | ICD-10-CM | POA: Diagnosis not present

## 2017-02-06 DIAGNOSIS — H9311 Tinnitus, right ear: Secondary | ICD-10-CM

## 2017-02-06 DIAGNOSIS — E669 Obesity, unspecified: Secondary | ICD-10-CM | POA: Diagnosis not present

## 2017-02-06 DIAGNOSIS — G5603 Carpal tunnel syndrome, bilateral upper limbs: Secondary | ICD-10-CM

## 2017-02-06 DIAGNOSIS — K219 Gastro-esophageal reflux disease without esophagitis: Secondary | ICD-10-CM

## 2017-02-06 DIAGNOSIS — I10 Essential (primary) hypertension: Secondary | ICD-10-CM

## 2017-02-06 DIAGNOSIS — J189 Pneumonia, unspecified organism: Secondary | ICD-10-CM

## 2017-02-06 NOTE — Assessment & Plan Note (Signed)
R>L pending f/u with hand surgery.

## 2017-02-06 NOTE — Addendum Note (Signed)
Addended by: Marchia Bond on: 02/06/2017 08:21 AM   Modules accepted: Orders

## 2017-02-06 NOTE — Assessment & Plan Note (Signed)
Discussed healthy diet and lifestyle changes to affect sustainable weight loss  

## 2017-02-06 NOTE — Assessment & Plan Note (Signed)
Resolved

## 2017-02-06 NOTE — Assessment & Plan Note (Signed)
Preventative protocols reviewed and updated unless pt declined. Discussed healthy diet and lifestyle.  

## 2017-02-06 NOTE — Patient Instructions (Addendum)
Labs today  For ears - try mineral oil to ears. If no better after 1 week of this, let me know for ENT referral.  I recommend pneumonia shot (pneumovax) - may get with daughter.  Sign release for records of colonoscopy by Dr Tamala Julian surgeon at [facility] are doing well today Return in 6 months for diabetes follow up visit  Health Maintenance, Female Introduction Adopting a healthy lifestyle and getting preventive care can go a long way to promote health and wellness. Talk with your health care provider about what schedule of regular examinations is right for you. This is a good chance for you to check in with your provider about disease prevention and staying healthy. In between checkups, there are plenty of things you can do on your own. Experts have done a lot of research about which lifestyle changes and preventive measures are most likely to keep you healthy. Ask your health care provider for more information. Weight and diet Eat a healthy diet  Be sure to include plenty of vegetables, fruits, low-fat dairy products, and lean protein.  Do not eat a lot of foods high in solid fats, added sugars, or salt.  Get regular exercise. This is one of the most important things you can do for your health.  Most adults should exercise for at least 150 minutes each week. The exercise should increase your heart rate and make you sweat (moderate-intensity exercise).  Most adults should also do strengthening exercises at least twice a week. This is in addition to the moderate-intensity exercise. Maintain a healthy weight  Body mass index (BMI) is a measurement that can be used to identify possible weight problems. It estimates body fat based on height and weight. Your health care provider can help determine your BMI and help you achieve or maintain a healthy weight.  For females 94 years of age and older:  A BMI below 18.5 is considered underweight.  A BMI of 18.5 to 24.9 is normal.  A BMI of 25 to  29.9 is considered overweight.  A BMI of 30 and above is considered obese. Watch levels of cholesterol and blood lipids  You should start having your blood tested for lipids and cholesterol at 62 years of age, then have this test every 5 years.  You may need to have your cholesterol levels checked more often if:  Your lipid or cholesterol levels are high.  You are older than 62 years of age.  You are at high risk for heart disease. Cancer screening Lung Cancer  Lung cancer screening is recommended for adults 26-21 years old who are at high risk for lung cancer because of a history of smoking.  A yearly low-dose CT scan of the lungs is recommended for people who:  Currently smoke.  Have quit within the past 15 years.  Have at least a 30-pack-year history of smoking. A pack year is smoking an average of one pack of cigarettes a day for 1 year.  Yearly screening should continue until it has been 15 years since you quit.  Yearly screening should stop if you develop a health problem that would prevent you from having lung cancer treatment. Breast Cancer  Practice breast self-awareness. This means understanding how your breasts normally appear and feel.  It also means doing regular breast self-exams. Let your health care provider know about any changes, no matter how small.  If you are in your 20s or 30s, you should have a clinical breast exam (CBE) by  a health care provider every 1-3 years as part of a regular health exam.  If you are 93 or older, have a CBE every year. Also consider having a breast X-ray (mammogram) every year.  If you have a family history of breast cancer, talk to your health care provider about genetic screening.  If you are at high risk for breast cancer, talk to your health care provider about having an MRI and a mammogram every year.  Breast cancer gene (BRCA) assessment is recommended for women who have family members with BRCA-related cancers.  BRCA-related cancers include:  Breast.  Ovarian.  Tubal.  Peritoneal cancers.  Results of the assessment will determine the need for genetic counseling and BRCA1 and BRCA2 testing. Cervical Cancer  Your health care provider may recommend that you be screened regularly for cancer of the pelvic organs (ovaries, uterus, and vagina). This screening involves a pelvic examination, including checking for microscopic changes to the surface of your cervix (Pap test). You may be encouraged to have this screening done every 3 years, beginning at age 29.  For women ages 75-65, health care providers may recommend pelvic exams and Pap testing every 3 years, or they may recommend the Pap and pelvic exam, combined with testing for human papilloma virus (HPV), every 5 years. Some types of HPV increase your risk of cervical cancer. Testing for HPV may also be done on women of any age with unclear Pap test results.  Other health care providers may not recommend any screening for nonpregnant women who are considered low risk for pelvic cancer and who do not have symptoms. Ask your health care provider if a screening pelvic exam is right for you.  If you have had past treatment for cervical cancer or a condition that could lead to cancer, you need Pap tests and screening for cancer for at least 20 years after your treatment. If Pap tests have been discontinued, your risk factors (such as having a new sexual partner) need to be reassessed to determine if screening should resume. Some women have medical problems that increase the chance of getting cervical cancer. In these cases, your health care provider may recommend more frequent screening and Pap tests. Colorectal Cancer  This type of cancer can be detected and often prevented.  Routine colorectal cancer screening usually begins at 62 years of age and continues through 62 years of age.  Your health care provider may recommend screening at an earlier age if you  have risk factors for colon cancer.  Your health care provider may also recommend using home test kits to check for hidden blood in the stool.  A small camera at the end of a tube can be used to examine your colon directly (sigmoidoscopy or colonoscopy). This is done to check for the earliest forms of colorectal cancer.  Routine screening usually begins at age 44.  Direct examination of the colon should be repeated every 5-10 years through 62 years of age. However, you may need to be screened more often if early forms of precancerous polyps or small growths are found. Skin Cancer  Check your skin from head to toe regularly.  Tell your health care provider about any new moles or changes in moles, especially if there is a change in a mole's shape or color.  Also tell your health care provider if you have a mole that is larger than the size of a pencil eraser.  Always use sunscreen. Apply sunscreen liberally and repeatedly throughout the  day.  Protect yourself by wearing long sleeves, pants, a wide-brimmed hat, and sunglasses whenever you are outside. Heart disease, diabetes, and high blood pressure  High blood pressure causes heart disease and increases the risk of stroke. High blood pressure is more likely to develop in:  People who have blood pressure in the high end of the normal range (130-139/85-89 mm Hg).  People who are overweight or obese.  People who are African American.  If you are 40-62 years of age, have your blood pressure checked every 3-5 years. If you are 57 years of age or older, have your blood pressure checked every year. You should have your blood pressure measured twice-once when you are at a hospital or clinic, and once when you are not at a hospital or clinic. Record the average of the two measurements. To check your blood pressure when you are not at a hospital or clinic, you can use:  An automated blood pressure machine at a pharmacy.  A home blood pressure  monitor.  If you are between 57 years and 58 years old, ask your health care provider if you should take aspirin to prevent strokes.  Have regular diabetes screenings. This involves taking a blood sample to check your fasting blood sugar level.  If you are at a normal weight and have a low risk for diabetes, have this test once every three years after 62 years of age.  If you are overweight and have a high risk for diabetes, consider being tested at a younger age or more often. Preventing infection Hepatitis B  If you have a higher risk for hepatitis B, you should be screened for this virus. You are considered at high risk for hepatitis B if:  You were born in a country where hepatitis B is common. Ask your health care provider which countries are considered high risk.  Your parents were born in a high-risk country, and you have not been immunized against hepatitis B (hepatitis B vaccine).  You have HIV or AIDS.  You use needles to inject street drugs.  You live with someone who has hepatitis B.  You have had sex with someone who has hepatitis B.  You get hemodialysis treatment.  You take certain medicines for conditions, including cancer, organ transplantation, and autoimmune conditions. Hepatitis C  Blood testing is recommended for:  Everyone born from [birth year] through 1966.  Anyone with known risk factors for hepatitis C. Sexually transmitted infections (STIs)  You should be screened for sexually transmitted infections (STIs) including gonorrhea and chlamydia if:  You are sexually active and are younger than 62 years of age.  You are older than 62 years of age and your health care provider tells you that you are at risk for this type of infection.  Your sexual activity has changed since you were last screened and you are at an increased risk for chlamydia or gonorrhea. Ask your health care provider if you are at risk.  If you do not have HIV, but are at risk, it may be  recommended that you take a prescription medicine daily to prevent HIV infection. This is called pre-exposure prophylaxis (PrEP). You are considered at risk if:  You are sexually active and do not regularly use condoms or know the HIV status of your partner(s).  You take drugs by injection.  You are sexually active with a partner who has HIV. Talk with your health care provider about whether you are at high risk of being infected  with HIV. If you choose to begin PrEP, you should first be tested for HIV. You should then be tested every 3 months for as long as you are taking PrEP. Pregnancy  If you are premenopausal and you may become pregnant, ask your health care provider about preconception counseling.  If you may become pregnant, take 400 to 800 micrograms (mcg) of folic acid every day.  If you want to prevent pregnancy, talk to your health care provider about birth control (contraception). Osteoporosis and menopause  Osteoporosis is a disease in which the bones lose minerals and strength with aging. This can result in serious bone fractures. Your risk for osteoporosis can be identified using a bone density scan.  If you are 24 years of age or older, or if you are at risk for osteoporosis and fractures, ask your health care provider if you should be screened.  Ask your health care provider whether you should take a calcium or vitamin D supplement to lower your risk for osteoporosis.  Menopause may have certain physical symptoms and risks.  Hormone replacement therapy may reduce some of these symptoms and risks. Talk to your health care provider about whether hormone replacement therapy is right for you. Follow these instructions at home:  Schedule regular health, dental, and eye exams.  Stay current with your immunizations.  Do not use any tobacco products including cigarettes, chewing tobacco, or electronic cigarettes.  If you are pregnant, do not drink alcohol.  If you are  breastfeeding, limit how much and how often you drink alcohol.  Limit alcohol intake to no more than 1 drink per day for nonpregnant women. One drink equals 12 ounces of beer, 5 ounces of wine, or 1 ounces of hard liquor.  Do not use street drugs.  Do not share needles.  Ask your health care provider for help if you need support or information about quitting drugs.  Tell your health care provider if you often feel depressed.  Tell your health care provider if you have ever been abused or do not feel safe at home. This information is not intended to replace advice given to you by your health care provider. Make sure you discuss any questions you have with your health care provider. Document Released: 06/18/2011 Document Revised: 05/10/2016 Document Reviewed: 09/06/2015  2017 Elsevier

## 2017-02-06 NOTE — Assessment & Plan Note (Signed)
Doing well on QOD PPI dosing.

## 2017-02-06 NOTE — Progress Notes (Signed)
Pre visit review using our clinic review tool, if applicable. No additional management support is needed unless otherwise documented below in the visit note. 

## 2017-02-06 NOTE — Assessment & Plan Note (Signed)
Chronic, stable. Update labs.  

## 2017-02-06 NOTE — Assessment & Plan Note (Signed)
Ongoing, with abnormal cerumen. rec mineral oil to ears, if no improvement will refer to ENT. Pt agrees with plan.

## 2017-02-06 NOTE — Assessment & Plan Note (Signed)
Chronic, stable. Continue current regimen. 

## 2017-02-06 NOTE — Progress Notes (Signed)
BP 136/72 (BP Location: Left Arm, Patient Position: Sitting, Cuff Size: Normal)   Pulse 84   Temp 98.1 F (36.7 C) (Oral)   Ht 4\' 10"  (1.473 m)   Wt 159 lb (72.1 kg)   SpO2 96%   BMI 33.23 kg/m    CC: CPE Subjective:    Patient ID: Olivia Benton, female    DOB: 04-Apr-1956, 61 y.o.   MRN: [id number]  HPI: Olivia Benton is a 61 y.o. female presenting on 02/06/2017 for Annual Exam and Ear Fullness   Ears stay stopped up and ringing worse at work.  CAP from last visit has improved after levaquin course.  Has appt Friday with hand surgery for R>L CTS  Preventative: Colon cancer screening - pt states normal, diverticulosis 2014 by Dr Tamala Julian surgery, no records available. rec rpt 10 yrs Breast cancer screening - 11/2016 WNL. She does breast exams at home.  Well woman exam - pap WNL 2013. S/p complete hysterectomy 1991 for endometriosis. She did have HRT for a year. H/o DVT as teenager when on OCP.  Lung cancer screening - thinks h/o 30 PY history. Declines screening at this time. DEXA scan - to consider, declines at this time.  Flu shot yearly  Tetanus shot - declines. Pneumovax - declines - will get through daughter Shingles shot - declines at this time but interested Advanced directive discussion - not set up Seat belt use discussed Sunscreen use and skin screen discussed  Ex smoker - quit 2010, prior ~30 PY hx Alcohol - none  Lives alone, no pets Occ: [facility] Activity: no regular exercise Diet: good water, fruits/vegetables daily  Relevant past medical, surgical, family and social history reviewed and updated as indicated. Interim medical history since our last visit reviewed. Allergies and medications reviewed and updated. Outpatient Medications Prior to Visit  Medication Sig Dispense Refill  . carisoprodol (SOMA) 350 MG tablet QHS PRN - reported on 05/24/2016  0  . cholecalciferol (VITAMIN D) 1000 UNITS tablet Take 1,000 Units by mouth daily.    Marland Kitchen desonide  (DESOWEN) 0.05 % cream Apply topically 2 (two) times daily. 30 g 0  . fluticasone (FLONASE) 50 MCG/ACT nasal spray Place 2 sprays into both nostrils daily. 48 g 3  . glucose blood (BAYER CONTOUR NEXT TEST) test strip Use as instructed to check blood glucose daily E11.9 100 each 12  . Hydrocodone-Acetaminophen 5-300 MG TABS as needed.   0  . levocetirizine (XYZAL) 5 MG tablet Take 5 mg by mouth every evening.    Marland Kitchen losartan-hydrochlorothiazide (HYZAAR) 100-12.5 MG tablet Take 1 tablet by mouth daily. 90 tablet 3  . metFORMIN (GLUCOPHAGE) 500 MG tablet Take 1 tablet (500 mg total) by mouth daily. 90 tablet 3  . montelukast (SINGULAIR) 10 MG tablet Take 1 tablet (10 mg total) by mouth at bedtime. 90 tablet 3  . Multiple Minerals-Vitamins (CALCIUM & VIT D3 BONE HEALTH PO) Take by mouth daily.    . TOPICORT 0.25 % LIQD Apply 0.25 sprays topically daily.    Marland Kitchen tretinoin (RETIN-A) 0.05 % cream Apply 1 application topically at bedtime.    . vitamin C (ASCORBIC ACID) 500 MG tablet Take 500 mg by mouth daily.    Marland Kitchen omeprazole (PRILOSEC) 40 MG capsule Take 1 capsule (40 mg total) by mouth daily. 90 capsule 3  . albuterol (PROVENTIL HFA;VENTOLIN HFA) 108 (90 Base) MCG/ACT inhaler Inhale 2 puffs into the lungs every 6 (six) hours as needed for wheezing or shortness of breath.  1 Inhaler 3  . aspirin 81 MG tablet Take 81 mg by mouth daily.      . fluconazole (DIFLUCAN) 150 MG tablet take 1 tablet by mouth ONCE. MAY REPEAT IN 3 DAYS. 2 tablet 0  . guaiFENesin-codeine (CHERATUSSIN AC) 100-10 MG/5ML syrup Take 5 mLs by mouth 2 (two) times daily as needed for cough (sedation precautions). 140 mL 0  . levofloxacin (LEVAQUIN) 500 MG tablet Take 1 tablet (500 mg total) by mouth daily. 7 tablet 0  . predniSONE (DELTASONE) 20 MG tablet Take 2 tablets (40 mg total) by mouth daily with breakfast. 10 tablet 0  . pseudoephedrine-guaifenesin (MUCINEX D) 60-600 MG 12 hr tablet Take 1 tablet by mouth every 12 (twelve) hours.      No facility-administered medications prior to visit.      Per HPI unless specifically indicated in ROS section below Review of Systems  Constitutional: Negative for activity change, appetite change, chills, fatigue, fever and unexpected weight change.  HENT: Negative for hearing loss.   Eyes: Negative for visual disturbance.  Respiratory: Positive for cough (recent PNA). Negative for chest tightness, shortness of breath and wheezing.   Cardiovascular: Positive for leg swelling (chronic L s/p DVT). Negative for chest pain and palpitations.  Gastrointestinal: Negative for abdominal distention, abdominal pain, blood in stool, constipation, diarrhea, nausea and vomiting.  Genitourinary: Negative for difficulty urinating and hematuria.  Musculoskeletal: Negative for arthralgias, myalgias and neck pain.  Skin: Negative for rash.  Neurological: Negative for dizziness, seizures, syncope and headaches.  Hematological: Negative for adenopathy. Does not bruise/bleed easily.  Psychiatric/Behavioral: Negative for dysphoric mood. The patient is not nervous/anxious.        Objective:    BP 136/72 (BP Location: Left Arm, Patient Position: Sitting, Cuff Size: Normal)   Pulse 84   Temp 98.1 F (36.7 C) (Oral)   Ht 4\' 10"  (1.473 m)   Wt 159 lb (72.1 kg)   SpO2 96%   BMI 33.23 kg/m   Wt Readings from Last 3 Encounters:  02/06/17 159 lb (72.1 kg)  12/31/16 157 lb 8 oz (71.4 kg)  12/12/16 157 lb 8 oz (71.4 kg)    Physical Exam  Constitutional: She is oriented to person, place, and time. She appears well-developed and well-nourished. No distress.  HENT:  Head: Normocephalic and atraumatic.  Right Ear: Hearing and external ear normal.  Left Ear: Hearing and external ear normal.  Nose: Nose normal.  Mouth/Throat: Uvula is midline, oropharynx is clear and moist and mucous membranes are normal. No oropharyngeal exudate, posterior oropharyngeal edema or posterior oropharyngeal erythema.  Erythema  of R external canal with abnormal appearing cerumen, s/p unsuccessful disimpaction with plastic curette L TM covered by cerumen  Eyes: Conjunctivae and EOM are normal. Pupils are equal, round, and reactive to light. No scleral icterus.  Neck: Normal range of motion. Neck supple. No thyromegaly present.  Cardiovascular: Normal rate, regular rhythm, normal heart sounds and intact distal pulses.   No murmur heard. Pulses:      Radial pulses are 2+ on the right side, and 2+ on the left side.  Pulmonary/Chest: Effort normal and breath sounds normal. No respiratory distress. She has no wheezes. She has no rales.  Breast - declined  Abdominal: Soft. Bowel sounds are normal. She exhibits no distension and no mass. There is no tenderness. There is no rebound and no guarding.  Genitourinary:  Genitourinary Comments: GYN - declined  Musculoskeletal: Normal range of motion. She exhibits no edema.  Lymphadenopathy:  She has no cervical adenopathy.  Neurological: She is alert and oriented to person, place, and time.  CN grossly intact, station and gait intact  Skin: Skin is warm and dry. No rash noted.  Psychiatric: She has a normal mood and affect. Her behavior is normal. Judgment and thought content normal.  Nursing note and vitals reviewed.  Results for orders placed or performed in visit on 11/26/16  HM MAMMOGRAPHY  Result Value Ref Range   HM Mammogram 0-4 Bi-Rad 0-4 Bi-Rad, Self Reported Normal      Assessment & Plan:   Problem List Items Addressed This Visit    Bilateral carpal tunnel syndrome    R>L pending f/u with hand surgery.       Community acquired pneumonia    Resolved.       Diabetes mellitus type 2, controlled (HCC)    Chronic, stable. Update labs.       Relevant Orders   Lipid panel   Hemoglobin [id number]   Basic metabolic panel   GERD (gastroesophageal reflux disease)    Doing well on QOD PPI dosing.       Relevant Medications   omeprazole (PRILOSEC) 40 MG capsule    Hypertension    Chronic, stable. Continue current regimen.       Obesity, Class I, BMI 30-34.9    Discussed healthy diet and lifestyle changes to affect sustainable weight loss.       Right-sided tinnitus    Ongoing, with abnormal cerumen. rec mineral oil to ears, if no improvement will refer to ENT. Pt agrees with plan.       Routine general medical examination at a health care facility - Primary    Preventative protocols reviewed and updated unless pt declined. Discussed healthy diet and lifestyle.           Follow up plan: Return in about 6 months (around 08/06/2017) for follow up visit.  Ria Bush, MD

## 2017-02-07 LAB — BASIC METABOLIC PANEL
BUN / CREAT RATIO: 21 (ref 12–28)
BUN: 15 mg/dL (ref 8–27)
CO2: 25 mmol/L (ref 18–29)
CREATININE: 0.72 mg/dL (ref 0.57–1.00)
Calcium: 9.4 mg/dL (ref 8.7–10.3)
Chloride: 97 mmol/L (ref 96–106)
GFR, EST AFRICAN AMERICAN: 105 (ref >59)
GFR, EST NON AFRICAN AMERICAN: 91 (ref >59)
Glucose: 111 mg/dL — ABNORMAL HIGH (ref 65–99)
Potassium: 4.1 mmol/L (ref 3.5–5.2)
SODIUM: 141 mmol/L (ref 134–144)

## 2017-02-07 LAB — LIPID PANEL
CHOL/HDL RATIO: 5.5 — AB (ref 0.0–4.4)
Cholesterol, Total: 165 mg/dL (ref 100–199)
HDL: 30 mg/dL — ABNORMAL LOW (ref >39)
LDL CALC: 94 (ref 0–99)
Triglycerides: 206 mg/dL — ABNORMAL HIGH (ref 0–149)
VLDL Cholesterol Cal: 41 — ABNORMAL HIGH (ref 5–40)

## 2017-02-07 LAB — HEMOGLOBIN A1C
ESTIMATED AVERAGE GLUCOSE: 134
HEMOGLOBIN A1C: 6.3 % — AB (ref 4.8–5.6)

## 2017-02-08 DIAGNOSIS — G5601 Carpal tunnel syndrome, right upper limb: Secondary | ICD-10-CM | POA: Diagnosis not present

## 2017-02-12 DIAGNOSIS — Q762 Congenital spondylolisthesis: Secondary | ICD-10-CM | POA: Diagnosis not present

## 2017-02-12 DIAGNOSIS — M419 Scoliosis, unspecified: Secondary | ICD-10-CM | POA: Diagnosis not present

## 2017-02-14 LAB — HM DIABETES EYE EXAM

## 2017-02-27 DIAGNOSIS — E113393 Type 2 diabetes mellitus with moderate nonproliferative diabetic retinopathy without macular edema, bilateral: Secondary | ICD-10-CM | POA: Diagnosis not present

## 2017-03-04 DIAGNOSIS — G5601 Carpal tunnel syndrome, right upper limb: Secondary | ICD-10-CM | POA: Diagnosis not present

## 2017-03-05 DIAGNOSIS — H9113 Presbycusis, bilateral: Secondary | ICD-10-CM | POA: Diagnosis not present

## 2017-03-05 DIAGNOSIS — H6123 Impacted cerumen, bilateral: Secondary | ICD-10-CM | POA: Diagnosis not present

## 2017-03-06 DIAGNOSIS — H6122 Impacted cerumen, left ear: Secondary | ICD-10-CM | POA: Insufficient documentation

## 2017-03-06 DIAGNOSIS — H6120 Impacted cerumen, unspecified ear: Secondary | ICD-10-CM | POA: Insufficient documentation

## 2017-04-15 ENCOUNTER — Other Ambulatory Visit: Payer: Self-pay | Admitting: Family Medicine

## 2017-04-16 NOTE — Telephone Encounter (Signed)
Received refill electronically Last refill 05/24/16 30 G Last office visit 02/06/17

## 2017-06-13 ENCOUNTER — Other Ambulatory Visit: Payer: Self-pay | Admitting: *Deleted

## 2017-06-13 MED ORDER — METFORMIN HCL 500 MG PO TABS: 500.0000 mg | ORAL_TABLET | Freq: Every day | ORAL | 1 refills | Status: DC

## 2017-06-17 DIAGNOSIS — H6123 Impacted cerumen, bilateral: Secondary | ICD-10-CM | POA: Diagnosis not present

## 2017-06-17 DIAGNOSIS — H9313 Tinnitus, bilateral: Secondary | ICD-10-CM | POA: Diagnosis not present

## 2017-06-17 DIAGNOSIS — H9113 Presbycusis, bilateral: Secondary | ICD-10-CM | POA: Diagnosis not present

## 2017-07-29 LAB — LIPID PANEL
Cholesterol: 156 (ref 0–200)
HDL: 25 — AB (ref 35–70)
LDL CALC: 79
TRIGLYCERIDES: 258 — AB (ref 40–160)

## 2017-07-29 LAB — HEMOGLOBIN A1C: Hemoglobin A1C: 6.4

## 2017-07-29 LAB — BASIC METABOLIC PANEL: GLUCOSE: 114

## 2017-08-06 DIAGNOSIS — Q762 Congenital spondylolisthesis: Secondary | ICD-10-CM | POA: Insufficient documentation

## 2017-08-06 DIAGNOSIS — M47816 Spondylosis without myelopathy or radiculopathy, lumbar region: Secondary | ICD-10-CM | POA: Diagnosis not present

## 2017-08-30 ENCOUNTER — Ambulatory Visit: Payer: 59 | Admitting: Family Medicine

## 2017-09-02 ENCOUNTER — Ambulatory Visit (INDEPENDENT_AMBULATORY_CARE_PROVIDER_SITE_OTHER): Payer: 59 | Admitting: Family Medicine

## 2017-09-02 ENCOUNTER — Encounter: Payer: Self-pay | Admitting: Family Medicine

## 2017-09-02 VITALS — BP 118/66 | HR 100 | Temp 98.3°F | Wt 163.0 lb

## 2017-09-02 DIAGNOSIS — E669 Obesity, unspecified: Secondary | ICD-10-CM | POA: Diagnosis not present

## 2017-09-02 DIAGNOSIS — Z636 Dependent relative needing care at home: Secondary | ICD-10-CM | POA: Diagnosis not present

## 2017-09-02 DIAGNOSIS — E119 Type 2 diabetes mellitus without complications: Secondary | ICD-10-CM

## 2017-09-02 DIAGNOSIS — Z23 Encounter for immunization: Secondary | ICD-10-CM

## 2017-09-02 MED ORDER — HYDROXYZINE HCL 25 MG PO TABS: 12.5000 mg | ORAL_TABLET | Freq: Two times a day (BID) | ORAL | 0 refills | Status: DC | PRN

## 2017-09-02 NOTE — Assessment & Plan Note (Signed)
Requests BMI exception form filled out for work. She has started weight watchers in the past month. Advised I would fill out today but would expect to see improvement in weight otherwise I will be unable to continue filling out. Pt agrees with plan .

## 2017-09-02 NOTE — Progress Notes (Signed)
BP 118/66 (BP Location: Left Arm, Patient Position: Sitting, Cuff Size: Normal)   Pulse 100   Temp 98.3 F (36.8 C) (Oral)   Wt 163 lb (73.9 kg)   SpO2 95%   BMI 34.07 kg/m    CC: 6 mo f/u visit Subjective:    Patient ID: Olivia Benton, female    DOB: 07-30-1956, 61 y.o.   MRN: [id number]  HPI: Olivia Benton is a 61 y.o. female presenting on 09/02/2017 for 6 mo follow-up (Pt provided record of recent labs)   Brings labwork from [place] which was reviewed. A1c 6.4%.  DM - regularly does not check sugars fasting today 139. Compliant with antihyperglycemic regimen which includes: metformin 515m once daily.  Denies low sugars or hypoglycemic symptoms. Denies paresthesias. Last diabetic eye exam 02/2017. Pneumovax: DUE - declines.  Prevnar: not due.  Lab Results  Component Value Date   HGBA1C 6.3 (H) 02/06/2017   Diabetic Foot Exam - Simple   Simple Foot Form Diabetic Foot exam was performed with the following findings:  Yes 09/02/2017  4:27 PM  Visual Inspection No deformities, no ulcerations, no other skin breakdown bilaterally:  Yes Sensation Testing Intact to touch and monofilament testing bilaterally:  Yes Pulse Check Posterior Tibialis and Dorsalis pulse intact bilaterally:  Yes Comments Mildly diminished pulses bilaterally      Obesity - Requests BMI exception form for work filled out. Last month started weight watchers. Prior on phentermine - caused constipation and didn't like how she felt. Also tried saxenda (did not tolerated this shot). She has seen bariatric clinic in the past.   [facility], treated with soma and hydrocodone (Dr RCarloyn Manner.  She had R hand surgery for CTS, planned L hand surgery 10/2017.   Finds she stays irritable with her mother. Some caregiver stress from this. Ex husband died last week. She finds she sleeps well.   Relevant past medical, surgical, family and social history reviewed and  updated as indicated. Interim medical history since our last visit reviewed. Allergies and medications reviewed and updated. Outpatient Medications Prior to Visit  Medication Sig Dispense Refill  . Blood Glucose Monitoring Suppl (ONE TOUCH ULTRA MINI) w/Device KIT Use to check sugar once daily. Dx: E11.9 100 each 3  . carisoprodol (SOMA) 350 MG tablet QHS PRN - reported on 05/24/2016  0  . cholecalciferol (VITAMIN D) 1000 UNITS tablet Take 1,000 Units by mouth daily.    .Marland Kitchendesonide (DESOWEN) 0.05 % cream apply to affected area twice a day 30 g 0  . fluticasone (FLONASE) 50 MCG/ACT nasal spray Place 2 sprays into both nostrils daily. 48 g 3  . glucose blood (ONE TOUCH ULTRA TEST) test strip Use to check sugar once daily. Dx: E11.9 100 each 3  . Hydrocodone-Acetaminophen 5-300 MG TABS as needed.   0  . levocetirizine (XYZAL) 5 MG tablet Take 5 mg by mouth every evening.    .Marland Kitchenlosartan-hydrochlorothiazide (HYZAAR) 100-12.5 MG tablet Take 1 tablet by mouth daily. 90 tablet 3  . metFORMIN (GLUCOPHAGE) 500 MG tablet Take 1 tablet (500 mg total) by mouth daily. 90 tablet 1  . montelukast (SINGULAIR) 10 MG tablet Take 1 tablet (10 mg total) by mouth at bedtime. 90 tablet 3  . Multiple Minerals-Vitamins (CALCIUM & VIT D3 BONE HEALTH PO) Take by mouth daily.    .Marland Kitchenomeprazole (PRILOSEC) 40 MG capsule Take 1 capsule (40 mg total) by mouth every other day.    .Marland Kitchen  ONETOUCH DELICA LANCETS 54Y MISC Use to check sugar once daily. Dx: E11.9 100 each 3  . TOPICORT 0.25 % LIQD Apply 0.25 sprays topically daily.    Marland Kitchen tretinoin (RETIN-A) 0.05 % cream Apply 1 application topically at bedtime.    . vitamin C (ASCORBIC ACID) 500 MG tablet Take 500 mg by mouth daily.     No facility-administered medications prior to visit.      Per HPI unless specifically indicated in ROS section below Review of Systems     Objective:    BP 118/66 (BP Location: Left Arm, Patient Position: Sitting, Cuff Size: Normal)   Pulse 100    Temp 98.3 F (36.8 C) (Oral)   Wt 163 lb (73.9 kg)   SpO2 95%   BMI 34.07 kg/m   Wt Readings from Last 3 Encounters:  09/02/17 163 lb (73.9 kg)  02/06/17 159 lb (72.1 kg)  12/31/16 157 lb 8 oz (71.4 kg)    Physical Exam  Constitutional: She appears well-developed and well-nourished. No distress.  HENT:  Head: Normocephalic and atraumatic.  Right Ear: External ear normal.  Left Ear: External ear normal.  Nose: Nose normal.  Mouth/Throat: Oropharynx is clear and moist. No oropharyngeal exudate.  Eyes: Pupils are equal, round, and reactive to light. Conjunctivae and EOM are normal. No scleral icterus.  Neck: Normal range of motion. Neck supple.  Cardiovascular: Normal rate, regular rhythm, normal heart sounds and intact distal pulses.   No murmur heard. Pulmonary/Chest: Effort normal and breath sounds normal. No respiratory distress. She has no wheezes. She has no rales.  Musculoskeletal: She exhibits no edema.  See HPI for foot exam if done  Lymphadenopathy:    She has no cervical adenopathy.  Skin: Skin is warm and dry. No rash noted.  Psychiatric: She has a normal mood and affect.  Nursing note and vitals reviewed.  Results for orders placed or performed in visit on 09/02/17  HM DIABETES EYE EXAM  Result Value Ref Range   HM Diabetic Eye Exam No Retinopathy No Retinopathy      Assessment & Plan:  Requests form for work filled out today.  Problem List Items Addressed This Visit    Caregiver stress    Support provided. At times feels overwhelmed with caring for her mother - her siblings don't help as much as she feels they should. Reviewed importance of healthy stress relieving strategies and time for herself. Will Rx hydroxyzine PRN anxiety. Update with effect, consider further treatment as needed.       Diabetes mellitus type 2, controlled ([place]) - Primary    Chronic, stable. Reviewed importance of healthy diet choices and encouraged ongoing weight loss. Pneumovax today.  Foot exam today.       Obesity, Class I, BMI 30-34.9    Requests BMI exception form filled out for work. She has started weight watchers in the past month. Advised I would fill out today but would expect to see improvement in weight otherwise I will be unable to continue filling out. Pt agrees with plan .          Follow up plan: Return in about 6 months (around 03/02/2018) for annual exam, prior fasting for blood work.  Ria Bush, MD

## 2017-09-02 NOTE — Assessment & Plan Note (Addendum)
Chronic, stable. Reviewed importance of healthy diet choices and encouraged ongoing weight loss. Pneumovax today. Foot exam today.

## 2017-09-02 NOTE — Addendum Note (Signed)
Addended by: Brenton Grills on: 9/47/0761 51:83 PM   Modules accepted: Orders

## 2017-09-02 NOTE — Assessment & Plan Note (Addendum)
Support provided. At times feels overwhelmed with caring for her mother - her siblings don't help as much as she feels they should. Reviewed importance of healthy stress relieving strategies and time for herself. Will Rx hydroxyzine PRN anxiety. Update with effect, consider further treatment as needed.

## 2017-09-02 NOTE — Patient Instructions (Addendum)
Pneumovax today.  Let us know when you get flu shot to update your chart.  May try hydroxyzine 1/2-1 tablet as needed for stress/anxiety - caution it can make you sleepy. Make sure you have healthy stress relieving strategies - and that includes taking time for yourself.  You are doing well today.  Return in 6 months or physical.

## 2017-09-05 ENCOUNTER — Encounter: Payer: Self-pay | Admitting: Family Medicine

## 2017-09-13 DIAGNOSIS — Z23 Encounter for immunization: Secondary | ICD-10-CM | POA: Diagnosis not present

## 2017-09-18 ENCOUNTER — Other Ambulatory Visit: Payer: Self-pay | Admitting: Family Medicine

## 2017-10-18 ENCOUNTER — Other Ambulatory Visit: Payer: Self-pay | Admitting: Family Medicine

## 2017-10-18 DIAGNOSIS — Z1231 Encounter for screening mammogram for malignant neoplasm of breast: Secondary | ICD-10-CM

## 2017-10-31 DIAGNOSIS — G5602 Carpal tunnel syndrome, left upper limb: Secondary | ICD-10-CM | POA: Diagnosis not present

## 2017-11-06 ENCOUNTER — Other Ambulatory Visit: Payer: Self-pay | Admitting: Family Medicine

## 2017-11-18 DIAGNOSIS — H61893 Other specified disorders of external ear, bilateral: Secondary | ICD-10-CM | POA: Diagnosis not present

## 2017-11-19 DIAGNOSIS — Q762 Congenital spondylolisthesis: Secondary | ICD-10-CM | POA: Diagnosis not present

## 2017-11-19 DIAGNOSIS — M47816 Spondylosis without myelopathy or radiculopathy, lumbar region: Secondary | ICD-10-CM | POA: Diagnosis not present

## 2017-11-20 ENCOUNTER — Other Ambulatory Visit: Payer: Self-pay | Admitting: Family Medicine

## 2017-12-06 ENCOUNTER — Ambulatory Visit
Admission: RE | Admit: 2017-12-06 | Discharge: 2017-12-06 | Disposition: A | Discharge status: Home / Self Care | Payer: 59 | Source: Ambulatory Visit | Attending: Family Medicine | Admitting: Family Medicine

## 2017-12-06 DIAGNOSIS — Z1231 Encounter for screening mammogram for malignant neoplasm of breast: Secondary | ICD-10-CM | POA: Diagnosis present

## 2017-12-06 LAB — HM MAMMOGRAPHY

## 2017-12-11 ENCOUNTER — Encounter: Payer: Self-pay | Admitting: Family Medicine

## 2017-12-20 DIAGNOSIS — J069 Acute upper respiratory infection, unspecified: Secondary | ICD-10-CM | POA: Diagnosis not present

## 2017-12-23 DIAGNOSIS — H6123 Impacted cerumen, bilateral: Secondary | ICD-10-CM | POA: Diagnosis not present

## 2017-12-23 DIAGNOSIS — H608X3 Other otitis externa, bilateral: Secondary | ICD-10-CM | POA: Diagnosis not present

## 2017-12-23 DIAGNOSIS — H7293 Unspecified perforation of tympanic membrane, bilateral: Secondary | ICD-10-CM | POA: Diagnosis not present

## 2018-02-02 ENCOUNTER — Other Ambulatory Visit: Payer: Self-pay | Admitting: Family Medicine

## 2018-02-05 ENCOUNTER — Other Ambulatory Visit: Payer: Self-pay | Admitting: Family Medicine

## 2018-02-05 ENCOUNTER — Other Ambulatory Visit: Payer: 59

## 2018-02-05 DIAGNOSIS — E119 Type 2 diabetes mellitus without complications: Secondary | ICD-10-CM

## 2018-02-05 DIAGNOSIS — Z1159 Encounter for screening for other viral diseases: Secondary | ICD-10-CM

## 2018-02-05 DIAGNOSIS — I10 Essential (primary) hypertension: Secondary | ICD-10-CM

## 2018-02-06 ENCOUNTER — Other Ambulatory Visit: Payer: Self-pay | Admitting: Family Medicine

## 2018-02-07 ENCOUNTER — Ambulatory Visit (INDEPENDENT_AMBULATORY_CARE_PROVIDER_SITE_OTHER): Payer: 59 | Admitting: Family Medicine

## 2018-02-07 ENCOUNTER — Encounter: Payer: Self-pay | Admitting: Family Medicine

## 2018-02-07 VITALS — BP 120/70 | HR 86 | Temp 98.1°F | Ht <= 58 in | Wt 166.0 lb

## 2018-02-07 DIAGNOSIS — I10 Essential (primary) hypertension: Secondary | ICD-10-CM | POA: Diagnosis not present

## 2018-02-07 DIAGNOSIS — E669 Obesity, unspecified: Secondary | ICD-10-CM | POA: Diagnosis not present

## 2018-02-07 DIAGNOSIS — E119 Type 2 diabetes mellitus without complications: Secondary | ICD-10-CM

## 2018-02-07 DIAGNOSIS — G8929 Other chronic pain: Secondary | ICD-10-CM

## 2018-02-07 DIAGNOSIS — M545 Low back pain: Secondary | ICD-10-CM | POA: Diagnosis not present

## 2018-02-07 DIAGNOSIS — Z114 Encounter for screening for human immunodeficiency virus [HIV]: Secondary | ICD-10-CM | POA: Diagnosis not present

## 2018-02-07 DIAGNOSIS — Z0000 Encounter for general adult medical examination without abnormal findings: Secondary | ICD-10-CM

## 2018-02-07 NOTE — Addendum Note (Signed)
Addended by: Ria Bush on: 02/07/2018 04:13 PM   Modules accepted: Orders

## 2018-02-07 NOTE — Assessment & Plan Note (Signed)
Sees Dr Carloyn Manner on soma and hydrocodone.

## 2018-02-07 NOTE — Assessment & Plan Note (Addendum)
Weight gain noted. Discussed healthy diet and lifestyle changes to affect sustainable weight loss. Encouraged healthy diet changes. Previously involved in weight watchers. Activity limited by chronic back pain. Discussed exercise options to look into over the next several months (gym for eliptical or stationary bike use).

## 2018-02-07 NOTE — Progress Notes (Addendum)
BP 120/70 (BP Location: Left Arm, Patient Position: Sitting, Cuff Size: Normal)   Pulse 86   Temp 98.1 F (36.7 C) (Oral)   Ht _0  (1.473 m)   Wt 166 lb (75.3 kg)   SpO2 97%   BMI 34.69 kg/m    CC: CPE Subjective:    Patient ID: Olivia Benton, female    DOB: 02/07/1956, 62 y.o.   MRN: [id number]  HPI: Olivia Benton is a 62 y.o. female presenting on 02/07/2018 for Annual Exam   Some trouble with anxiety - hydroxyzine did not help.   Preventative: Colon cancer screening - pt states normal, diverticulosis 2014 by Dr Tamala Julian surgery, no records available. rec rpt 10 yrs Breast cancer screening - 11/2017 WNL. She does breast exams at home.  Well woman exam - pap WNL 2013. S/p complete hysterectomy 1991 for endometriosis. She did have HRT for a year. H/o DVT as teenager when on OCP.  Lung cancer screening - thinks h/o 30 PY history. Declines screening at this time. DEXA scan - to consider, declines at this time.  Flu shot yearly  Tetanus shot - declines. Pneumovax - 08/2017.  shingrix - discussed Advanced directive discussion - not set up Seat belt use discussed Sunscreen use discussed, no changing moles Ex smoker - quit 2010, prior ~30 PY hx Alcohol - none  Lives alone, no pets Occ: [facility] Activity: no regular exercise  Diet: good water, fruits/vegetables daily  Relevant past medical, surgical, family and social history reviewed and updated as indicated. Interim medical history since our last visit reviewed. Allergies and medications reviewed and updated. Outpatient Medications Prior to Visit  Medication Sig Dispense Refill  . Blood Glucose Monitoring Suppl (ONE TOUCH ULTRA MINI) w/Device KIT Use to check sugar once daily. Dx: E11.9 100 each 3  . carisoprodol (SOMA) 350 MG tablet QHS PRN - reported on 05/24/2016  0  . cholecalciferol (VITAMIN D) 1000 UNITS tablet Take 1,000 Units by mouth daily.    Marland Kitchen desonide (DESOWEN) 0.05 % cream apply to affected area twice a  day 30 g 0  . fluticasone (FLONASE) 50 MCG/ACT nasal spray USE 2 SPRAYS IN EACH  NOSTRIL DAILY 48 g 0  . glucose blood (ONE TOUCH ULTRA TEST) test strip Use to check sugar once daily. Dx: E11.9 100 each 3  . Hydrocodone-Acetaminophen 5-300 MG TABS as needed.   0  . levocetirizine (XYZAL) 5 MG tablet Take 5 mg by mouth every evening.    Marland Kitchen losartan-hydrochlorothiazide (HYZAAR) 100-12.5 MG tablet TAKE 1 TABLET BY MOUTH  DAILY 90 tablet 1  . metFORMIN (GLUCOPHAGE) 500 MG tablet TAKE 1 TABLET BY MOUTH  DAILY 90 tablet 0  . montelukast (SINGULAIR) 10 MG tablet TAKE 1 TABLET BY MOUTH AT  BEDTIME 90 tablet 3  . Multiple Minerals-Vitamins (CALCIUM & VIT D3 BONE HEALTH PO) Take by mouth daily.    Marland Kitchen omeprazole (PRILOSEC) 40 MG capsule Take 1 capsule (40 mg total) by mouth every other day.    Marland Kitchen omeprazole (PRILOSEC) 40 MG capsule TAKE 1 CAPSULE BY MOUTH  DAILY 90 capsule 3  . ONETOUCH DELICA LANCETS 01I MISC Use to check sugar once daily. Dx: E11.9 100 each 3  . tretinoin (RETIN-A) 0.05 % cream Apply 1 application topically at bedtime.    . vitamin C (ASCORBIC ACID) 500 MG tablet Take 500 mg by mouth daily.    . TOPICORT 0.25 % LIQD Apply 0.25 sprays topically daily.    . hydrOXYzine (ATARAX/VISTARIL)  25 MG tablet Take 0.5-1 tablets (12.5-25 mg total) by mouth 2 (two) times daily as needed for anxiety. 30 tablet 0   No facility-administered medications prior to visit.      Per HPI unless specifically indicated in ROS section below Review of Systems  Constitutional: Negative for activity change, appetite change, chills, fatigue, fever and unexpected weight change.  HENT: Negative for hearing loss.   Eyes: Negative for visual disturbance.  Respiratory: Negative for cough, chest tightness, shortness of breath and wheezing.   Cardiovascular: Negative for chest pain, palpitations and leg swelling.  Gastrointestinal: Negative for abdominal distention, abdominal pain, blood in stool, constipation, diarrhea,  nausea and vomiting.  Genitourinary: Negative for difficulty urinating and hematuria.  Musculoskeletal: Negative for arthralgias, myalgias and neck pain.  Skin: Negative for rash.  Neurological: Positive for headaches (allergy). Negative for dizziness, seizures and syncope.  Hematological: Negative for adenopathy. Does not bruise/bleed easily.  Psychiatric/Behavioral: Negative for dysphoric mood. The patient is not nervous/anxious.        Objective:    BP 120/70 (BP Location: Left Arm, Patient Position: Sitting, Cuff Size: Normal)   Pulse 86   Temp 98.1 F (36.7 C) (Oral)   Ht _0  (1.473 m)   Wt 166 lb (75.3 kg)   SpO2 97%   BMI 34.69 kg/m   Wt Readings from Last 3 Encounters:  02/07/18 166 lb (75.3 kg)  09/02/17 163 lb (73.9 kg)  02/06/17 159 lb (72.1 kg)    Physical Exam  Constitutional: She is oriented to person, place, and time. She appears well-developed and well-nourished. No distress.  HENT:  Head: Normocephalic and atraumatic.  Right Ear: Hearing, tympanic membrane, external ear and ear canal normal.  Left Ear: Hearing, tympanic membrane, external ear and ear canal normal.  Nose: Nose normal.  Mouth/Throat: Uvula is midline, oropharynx is clear and moist and mucous membranes are normal. No oropharyngeal exudate, posterior oropharyngeal edema or posterior oropharyngeal erythema.  Eyes: Conjunctivae and EOM are normal. Pupils are equal, round, and reactive to light. No scleral icterus.  Neck: Normal range of motion. Neck supple. Carotid bruit is not present. No thyromegaly present.  Cardiovascular: Normal rate, regular rhythm, normal heart sounds and intact distal pulses.  No murmur heard. Pulses:      Radial pulses are 2+ on the right side, and 2+ on the left side.  Pulmonary/Chest: Effort normal and breath sounds normal. No respiratory distress. She has no wheezes. She has no rales.  Abdominal: Soft. Bowel sounds are normal. She exhibits no distension and no mass.  There is no tenderness. There is no rebound and no guarding.  Musculoskeletal: Normal range of motion. She exhibits no edema.  Lymphadenopathy:    She has no cervical adenopathy.  Neurological: She is alert and oriented to person, place, and time.  CN grossly intact, station and gait intact  Skin: Skin is warm and dry. No rash noted.  Psychiatric: She has a normal mood and affect. Her behavior is normal. Judgment and thought content normal.  Nursing note and vitals reviewed.  Results for orders placed or performed in visit on 12/11/17  HM MAMMOGRAPHY  Result Value Ref Range   HM Mammogram 0-4 Bi-Rad 0-4 Bi-Rad, Self Reported Normal   Lab Results  Component Value Date   HGBA1C 6.40% 07/29/2017    Lab Results  Component Value Date   CHOL 156 07/29/2017   HDL 25 (A) 07/29/2017   LDLCALC 79 07/29/2017   TRIG 258 (A) 07/29/2017  CHOLHDL 5.5 (H) 02/06/2017       Assessment & Plan:   Problem List Items Addressed This Visit    Chronic low back pain    Sees Dr Carloyn Manner on soma and hydrocodone.       Diabetes mellitus type 2, controlled ([place])    Upcoming labs. Will review when she returns fasting.       Hypertension    Chronic, stable. Continue current regimen.       Obesity, Class I, BMI 30-34.9    Weight gain noted. Discussed healthy diet and lifestyle changes to affect sustainable weight loss. Encouraged healthy diet changes. Previously involved in weight watchers. Activity limited by chronic back pain. Discussed exercise options to look into over the next several months (gym for eliptical or stationary bike use).      Routine general medical examination at a health care facility - Primary    Preventative protocols reviewed and updated unless pt declined. Discussed healthy diet and lifestyle.        Other Visit Diagnoses    Encounter for screening for HIV       Relevant Orders   HIV antibody       No orders of the defined types were placed in this encounter.  Orders  Placed This Encounter  Procedures  . HIV antibody    Standing Status:   Future    Standing Expiration Date:   02/07/2019    Follow up plan: Return in about 6 months (around 08/07/2018) for follow up visit.  Ria Bush, MD

## 2018-02-07 NOTE — Patient Instructions (Addendum)
Sign release of records for colonoscopy done 2014 by Dr Tamala Julian surgeon at [facility].  Come back for fasting labs  If interested, check with pharmacy about new 2 shot shingles series (shingrix).  Work on exercise program.  Good to see you today, call us with questions. Return as needed or in 6 months for diabetes follow up visit.   Health Maintenance, Female Adopting a healthy lifestyle and getting preventive care can go a long way to promote health and wellness. Talk with your health care provider about what schedule of regular examinations is right for you. This is a good chance for you to check in with your provider about disease prevention and staying healthy. In between checkups, there are plenty of things you can do on your own. Experts have done a lot of research about which lifestyle changes and preventive measures are most likely to keep you healthy. Ask your health care provider for more information. Weight and diet Eat a healthy diet  Be sure to include plenty of vegetables, fruits, low-fat dairy products, and lean protein.  Do not eat a lot of foods high in solid fats, added sugars, or salt.  Get regular exercise. This is one of the most important things you can do for your health. ? Most adults should exercise for at least 150 minutes each week. The exercise should increase your heart rate and make you sweat (moderate-intensity exercise). ? Most adults should also do strengthening exercises at least twice a week. This is in addition to the moderate-intensity exercise.  Maintain a healthy weight  Body mass index (BMI) is a measurement that can be used to identify possible weight problems. It estimates body fat based on height and weight. Your health care provider can help determine your BMI and help you achieve or maintain a healthy weight.  For females 47 years of age and older: ? A BMI below 18.5 is considered underweight. ? A BMI of 18.5 to 24.9 is normal. ? A BMI of 25 to  29.9 is considered overweight. ? A BMI of 30 and above is considered obese.  Watch levels of cholesterol and blood lipids  You should start having your blood tested for lipids and cholesterol at 63 years of age, then have this test every 5 years.  You may need to have your cholesterol levels checked more often if: ? Your lipid or cholesterol levels are high. ? You are older than 63 years of age. ? You are at high risk for heart disease.  Cancer screening Lung Cancer  Lung cancer screening is recommended for adults 106-90 years old who are at high risk for lung cancer because of a history of smoking.  A yearly low-dose CT scan of the lungs is recommended for people who: ? Currently smoke. ? Have quit within the past 15 years. ? Have at least a 30-pack-year history of smoking. A pack year is smoking an average of one pack of cigarettes a day for 1 year.  Yearly screening should continue until it has been 15 years since you quit.  Yearly screening should stop if you develop a health problem that would prevent you from having lung cancer treatment.  Breast Cancer  Practice breast self-awareness. This means understanding how your breasts normally appear and feel.  It also means doing regular breast self-exams. Let your health care provider know about any changes, no matter how small.  If you are in your 20s or 30s, you should have a clinical breast exam (CBE)  by a health care provider every 1-3 years as part of a regular health exam.  If you are 40 or older, have a CBE every year. Also consider having a breast X-ray (mammogram) every year.  If you have a family history of breast cancer, talk to your health care provider about genetic screening.  If you are at high risk for breast cancer, talk to your health care provider about having an MRI and a mammogram every year.  Breast cancer gene (BRCA) assessment is recommended for women who have family members with BRCA-related cancers.  BRCA-related cancers include: ? Breast. ? Ovarian. ? Tubal. ? Peritoneal cancers.  Results of the assessment will determine the need for genetic counseling and BRCA1 and BRCA2 testing.  Cervical Cancer Your health care provider may recommend that you be screened regularly for cancer of the pelvic organs (ovaries, uterus, and vagina). This screening involves a pelvic examination, including checking for microscopic changes to the surface of your cervix (Pap test). You may be encouraged to have this screening done every 3 years, beginning at age 21.  For women ages 30-65, health care providers may recommend pelvic exams and Pap testing every 3 years, or they may recommend the Pap and pelvic exam, combined with testing for human papilloma virus (HPV), every 5 years. Some types of HPV increase your risk of cervical cancer. Testing for HPV may also be done on women of any age with unclear Pap test results.  Other health care providers may not recommend any screening for nonpregnant women who are considered low risk for pelvic cancer and who do not have symptoms. Ask your health care provider if a screening pelvic exam is right for you.  If you have had past treatment for cervical cancer or a condition that could lead to cancer, you need Pap tests and screening for cancer for at least 20 years after your treatment. If Pap tests have been discontinued, your risk factors (such as having a new sexual partner) need to be reassessed to determine if screening should resume. Some women have medical problems that increase the chance of getting cervical cancer. In these cases, your health care provider may recommend more frequent screening and Pap tests.  Colorectal Cancer  This type of cancer can be detected and often prevented.  Routine colorectal cancer screening usually begins at 63 years of age and continues through 63 years of age.  Your health care provider may recommend screening at an earlier age if  you have risk factors for colon cancer.  Your health care provider may also recommend using home test kits to check for hidden blood in the stool.  A small camera at the end of a tube can be used to examine your colon directly (sigmoidoscopy or colonoscopy). This is done to check for the earliest forms of colorectal cancer.  Routine screening usually begins at age 50.  Direct examination of the colon should be repeated every 5-10 years through 63 years of age. However, you may need to be screened more often if early forms of precancerous polyps or small growths are found.  Skin Cancer  Check your skin from head to toe regularly.  Tell your health care provider about any new moles or changes in moles, especially if there is a change in a mole's shape or color.  Also tell your health care provider if you have a mole that is larger than the size of a pencil eraser.  Always use sunscreen. Apply sunscreen liberally and   repeatedly throughout the day.  Protect yourself by wearing long sleeves, pants, a wide-brimmed hat, and sunglasses whenever you are outside.  Heart disease, diabetes, and high blood pressure  High blood pressure causes heart disease and increases the risk of stroke. High blood pressure is more likely to develop in: ? People who have blood pressure in the high end of the normal range (130-139/85-89 mm Hg). ? People who are overweight or obese. ? People who are African American.  If you are 18-39 years of age, have your blood pressure checked every 3-5 years. If you are 40 years of age or older, have your blood pressure checked every year. You should have your blood pressure measured twice-once when you are at a hospital or clinic, and once when you are not at a hospital or clinic. Record the average of the two measurements. To check your blood pressure when you are not at a hospital or clinic, you can use: ? An automated blood pressure machine at a pharmacy. ? A home blood  pressure monitor.  If you are between 55 years and 79 years old, ask your health care provider if you should take aspirin to prevent strokes.  Have regular diabetes screenings. This involves taking a blood sample to check your fasting blood sugar level. ? If you are at a normal weight and have a low risk for diabetes, have this test once every three years after 63 years of age. ? If you are overweight and have a high risk for diabetes, consider being tested at a younger age or more often. Preventing infection Hepatitis B  If you have a higher risk for hepatitis B, you should be screened for this virus. You are considered at high risk for hepatitis B if: ? You were born in a country where hepatitis B is common. Ask your health care provider which countries are considered high risk. ? Your parents were born in a high-risk country, and you have not been immunized against hepatitis B (hepatitis B vaccine). ? You have HIV or AIDS. ? You use needles to inject street drugs. ? You live with someone who has hepatitis B. ? You have had sex with someone who has hepatitis B. ? You get hemodialysis treatment. ? You take certain medicines for conditions, including cancer, organ transplantation, and autoimmune conditions.  Hepatitis C  Blood testing is recommended for: ? Everyone born from 1945 through 1965. ? Anyone with known risk factors for hepatitis C.  Sexually transmitted infections (STIs)  You should be screened for sexually transmitted infections (STIs) including gonorrhea and chlamydia if: ? You are sexually active and are younger than 63 years of age. ? You are older than 63 years of age and your health care provider tells you that you are at risk for this type of infection. ? Your sexual activity has changed since you were last screened and you are at an increased risk for chlamydia or gonorrhea. Ask your health care provider if you are at risk.  If you do not have HIV, but are at risk,  it may be recommended that you take a prescription medicine daily to prevent HIV infection. This is called pre-exposure prophylaxis (PrEP). You are considered at risk if: ? You are sexually active and do not regularly use condoms or know the HIV status of your partner(s). ? You take drugs by injection. ? You are sexually active with a partner who has HIV.  Talk with your health care provider about whether you   are at high risk of being infected with HIV. If you choose to begin PrEP, you should first be tested for HIV. You should then be tested every 3 months for as long as you are taking PrEP. Pregnancy  If you are premenopausal and you may become pregnant, ask your health care provider about preconception counseling.  If you may become pregnant, take 400 to 800 micrograms (mcg) of folic acid every day.  If you want to prevent pregnancy, talk to your health care provider about birth control (contraception). Osteoporosis and menopause  Osteoporosis is a disease in which the bones lose minerals and strength with aging. This can result in serious bone fractures. Your risk for osteoporosis can be identified using a bone density scan.  If you are 56 years of age or older, or if you are at risk for osteoporosis and fractures, ask your health care provider if you should be screened.  Ask your health care provider whether you should take a calcium or vitamin D supplement to lower your risk for osteoporosis.  Menopause may have certain physical symptoms and risks.  Hormone replacement therapy may reduce some of these symptoms and risks. Talk to your health care provider about whether hormone replacement therapy is right for you. Follow these instructions at home:  Schedule regular health, dental, and eye exams.  Stay current with your immunizations.  Do not use any tobacco products including cigarettes, chewing tobacco, or electronic cigarettes.  If you are pregnant, do not drink  alcohol.  If you are breastfeeding, limit how much and how often you drink alcohol.  Limit alcohol intake to no more than 1 drink per day for nonpregnant women. One drink equals 12 ounces of beer, 5 ounces of wine, or 1 ounces of hard liquor.  Do not use street drugs.  Do not share needles.  Ask your health care provider for help if you need support or information about quitting drugs.  Tell your health care provider if you often feel depressed.  Tell your health care provider if you have ever been abused or do not feel safe at home. This information is not intended to replace advice given to you by your health care provider. Make sure you discuss any questions you have with your health care provider. Document Released: 06/18/2011 Document Revised: 05/10/2016 Document Reviewed: 09/06/2015 Elsevier Interactive Patient Education  [employer].

## 2018-02-07 NOTE — Assessment & Plan Note (Signed)
Preventative protocols reviewed and updated unless pt declined. Discussed healthy diet and lifestyle.  

## 2018-02-07 NOTE — Assessment & Plan Note (Signed)
Upcoming labs. Will review when she returns fasting.

## 2018-02-07 NOTE — Assessment & Plan Note (Signed)
Chronic, stable. Continue current regimen. 

## 2018-02-11 ENCOUNTER — Other Ambulatory Visit: Payer: 59

## 2018-02-19 ENCOUNTER — Other Ambulatory Visit: Payer: 59

## 2018-02-19 DIAGNOSIS — I10 Essential (primary) hypertension: Secondary | ICD-10-CM | POA: Diagnosis not present

## 2018-02-19 DIAGNOSIS — Z114 Encounter for screening for human immunodeficiency virus [HIV]: Secondary | ICD-10-CM

## 2018-02-19 DIAGNOSIS — Z1159 Encounter for screening for other viral diseases: Secondary | ICD-10-CM

## 2018-02-19 DIAGNOSIS — E119 Type 2 diabetes mellitus without complications: Secondary | ICD-10-CM | POA: Diagnosis not present

## 2018-02-19 DIAGNOSIS — M4127 Other idiopathic scoliosis, lumbosacral region: Secondary | ICD-10-CM | POA: Diagnosis not present

## 2018-02-19 DIAGNOSIS — Q762 Congenital spondylolisthesis: Secondary | ICD-10-CM | POA: Diagnosis not present

## 2018-02-19 NOTE — Addendum Note (Signed)
Addended by: Ellamae Sia on: 02/19/2018 07:57 AM   Modules accepted: Orders

## 2018-02-20 LAB — COMPREHENSIVE METABOLIC PANEL
ALK PHOS: 77 IU/L (ref 39–117)
ALT: 37 IU/L — AB (ref 0–32)
AST: 31 IU/L (ref 0–40)
Albumin/Globulin Ratio: 1.5 (ref 1.2–2.2)
Albumin: 4.3 g/dL (ref 3.6–4.8)
BUN/Creatinine Ratio: 31 — ABNORMAL HIGH (ref 12–28)
BUN: 20 mg/dL (ref 8–27)
Bilirubin Total: 0.3 mg/dL (ref 0.0–1.2)
CALCIUM: 9.3 mg/dL (ref 8.7–10.3)
CO2: 28 mmol/L (ref 20–29)
CREATININE: 0.64 mg/dL (ref 0.57–1.00)
Chloride: 99 mmol/L (ref 96–106)
GFR calc Af Amer: 111 mL/min/{1.73_m2} (ref >59)
GFR, EST NON AFRICAN AMERICAN: 96 mL/min/{1.73_m2} (ref >59)
Globulin, Total: 2.8 g/dL (ref 1.5–4.5)
Glucose: 117 mg/dL — ABNORMAL HIGH (ref 65–99)
Potassium: 3.8 mmol/L (ref 3.5–5.2)
SODIUM: 141 mmol/L (ref 134–144)
Total Protein: 7.1 g/dL (ref 6.0–8.5)

## 2018-02-20 LAB — HEPATITIS C ANTIBODY: HEP C VIRUS AB: 0.7 {s_co_ratio} (ref 0.0–0.9)

## 2018-02-20 LAB — LIPID PANEL
CHOL/HDL RATIO: 6.4 ratio — AB (ref 0.0–4.4)
Cholesterol, Total: 166 mg/dL (ref 100–199)
HDL: 26 mg/dL — ABNORMAL LOW (ref >39)
LDL CALC: 97 mg/dL (ref 0–99)
Triglycerides: 217 mg/dL — ABNORMAL HIGH (ref 0–149)
VLDL CHOLESTEROL CAL: 43 mg/dL — AB (ref 5–40)

## 2018-02-20 LAB — HEMOGLOBIN A1C
ESTIMATED AVERAGE GLUCOSE: 148 mg/dL
HEMOGLOBIN A1C: 6.8 % — AB (ref 4.8–5.6)

## 2018-02-20 LAB — HIV ANTIBODY (ROUTINE TESTING W REFLEX): HIV Screen 4th Generation wRfx: NONREACTIVE

## 2018-03-03 DIAGNOSIS — E113393 Type 2 diabetes mellitus with moderate nonproliferative diabetic retinopathy without macular edema, bilateral: Secondary | ICD-10-CM | POA: Diagnosis not present

## 2018-03-04 DIAGNOSIS — B369 Superficial mycosis, unspecified: Secondary | ICD-10-CM | POA: Diagnosis not present

## 2018-03-04 DIAGNOSIS — H6243 Otitis externa in other diseases classified elsewhere, bilateral: Secondary | ICD-10-CM | POA: Diagnosis not present

## 2018-03-05 IMAGING — MG MM DIGITAL SCREENING BILAT W/ TOMO W/ CAD
8 of 12 series · 8 of 28 positions shown · non-contrast
Comparison: Previous exam(s).

CLINICAL DATA: Screening.

EXAM:
2D DIGITAL SCREENING BILATERAL MAMMOGRAM WITH CAD AND ADJUNCT TOMO

[R CC]
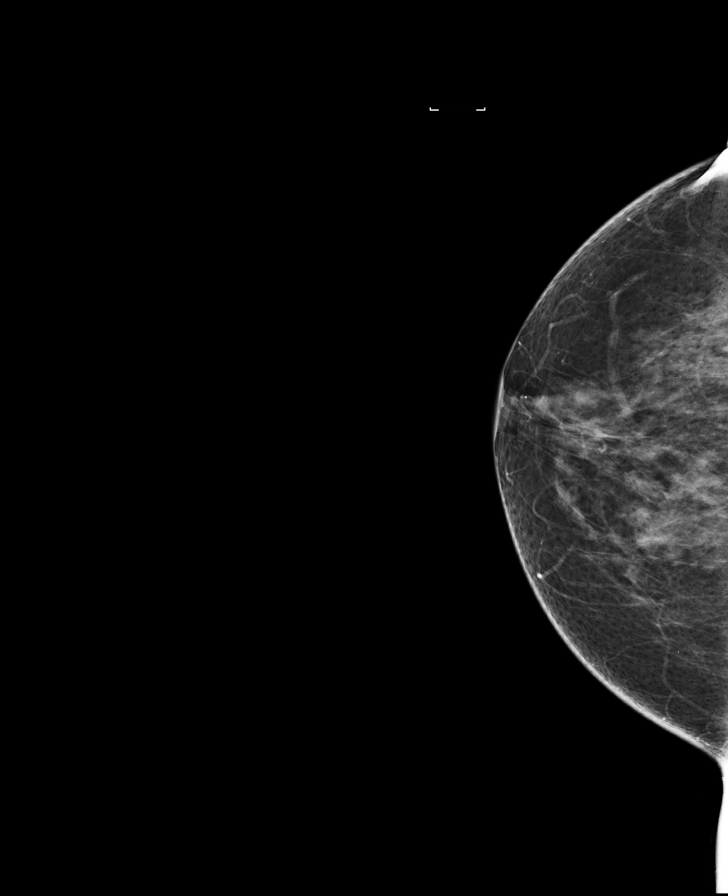

[L MLO synth-2D]
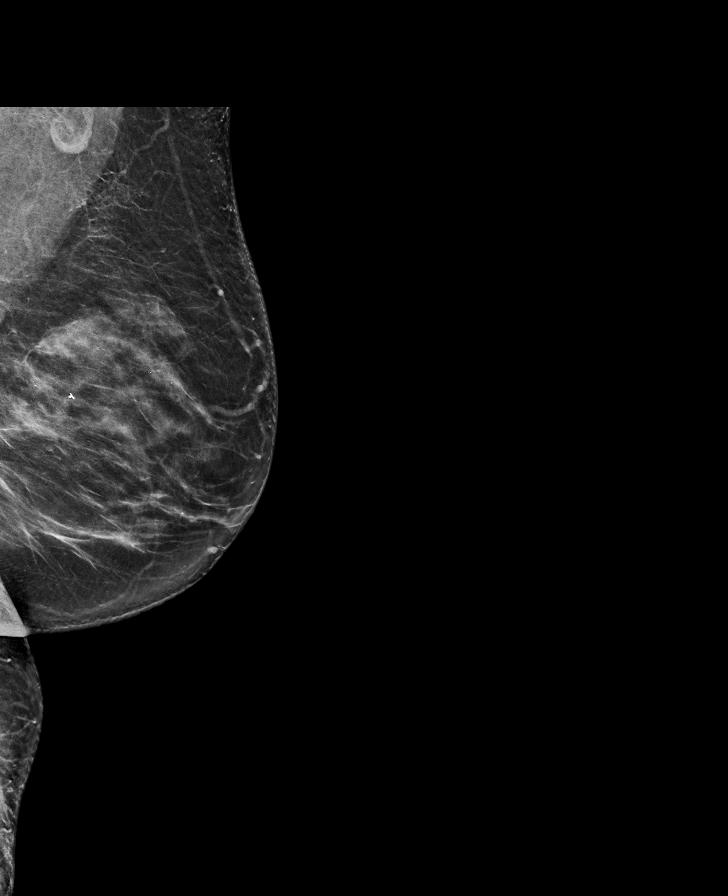

[L CC synth-2D]
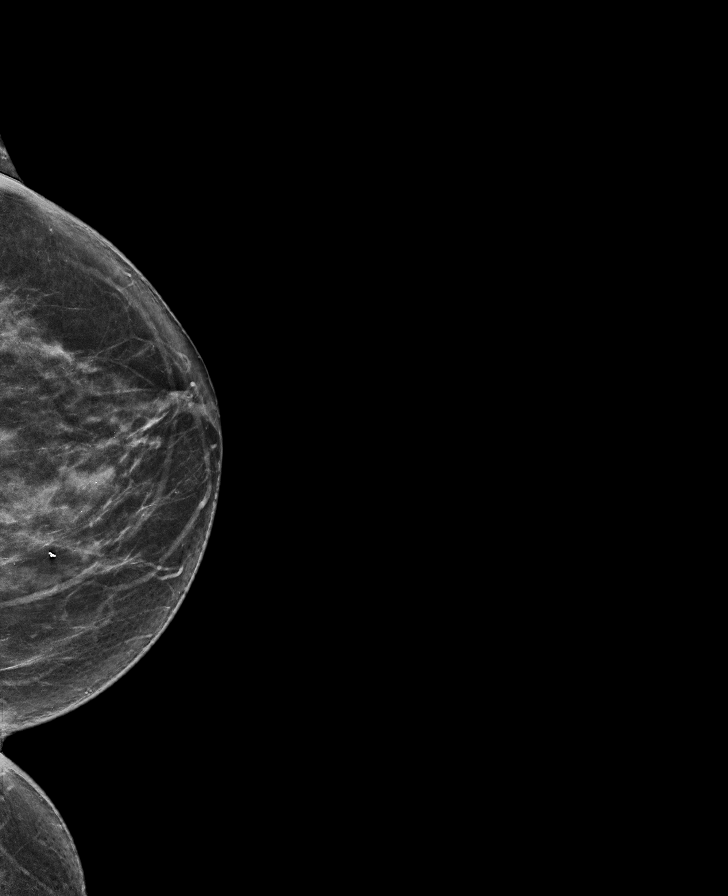

[R MLO synth-2D]
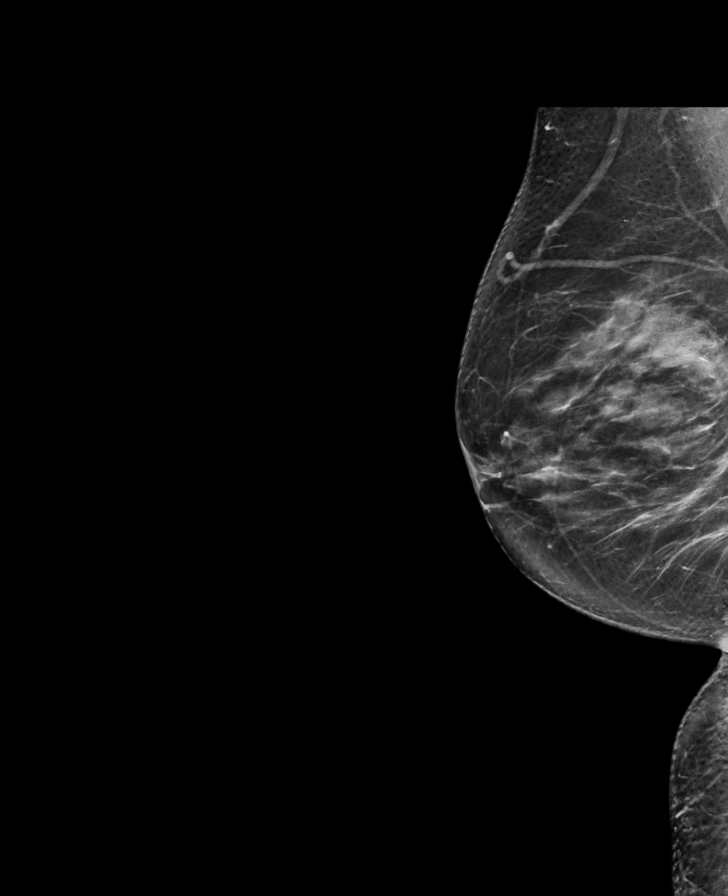

[L MLO]
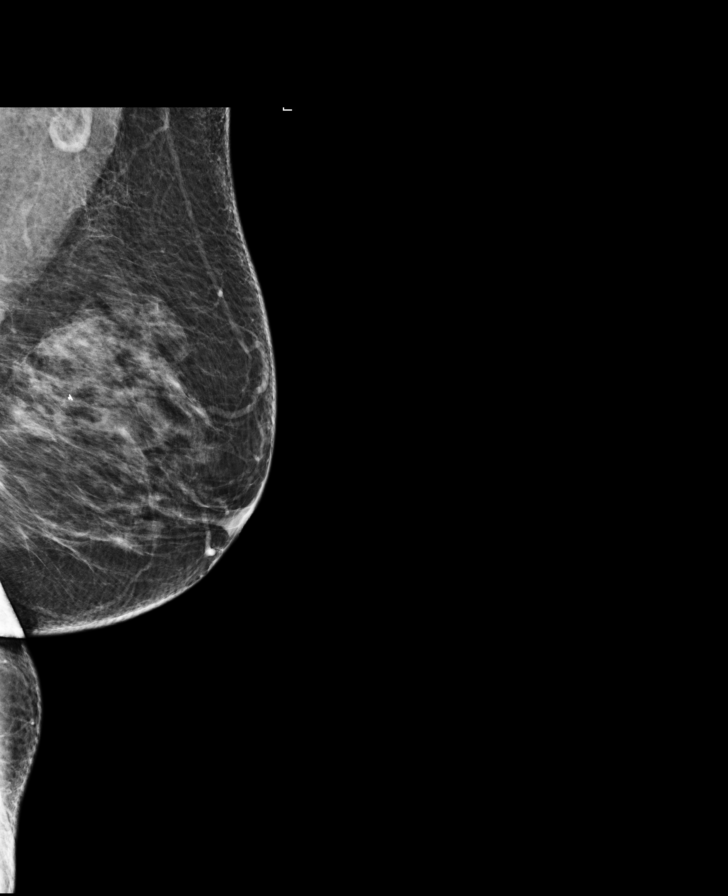

[R MLO]
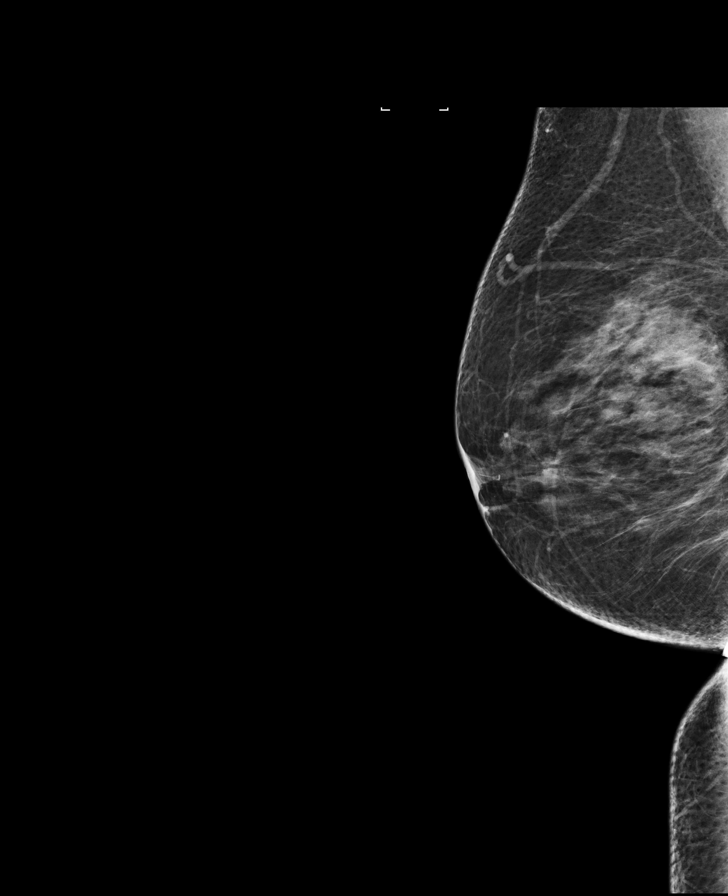

[R CC synth-2D]
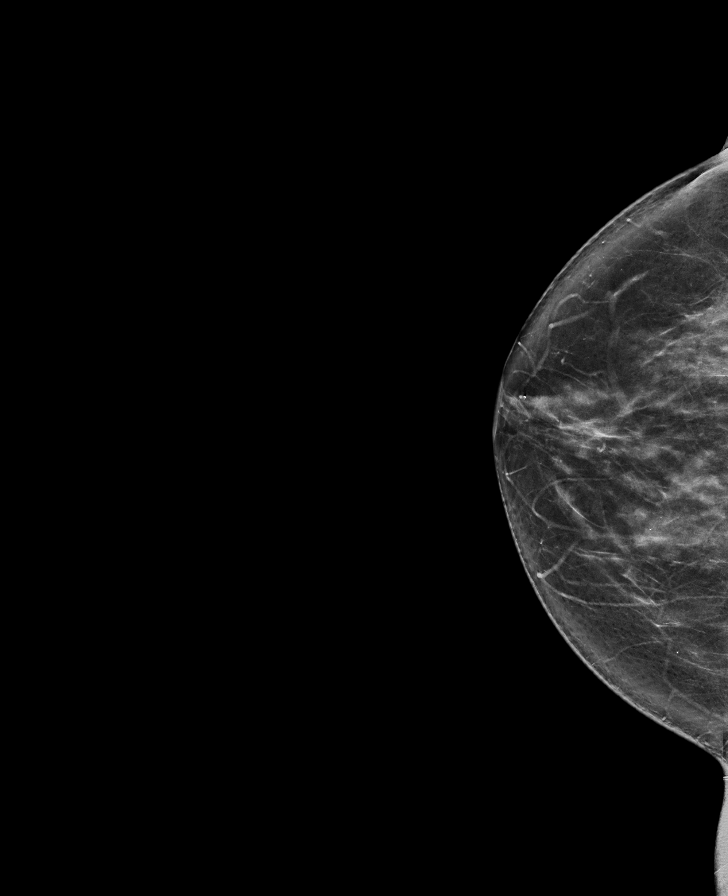

[L CC]
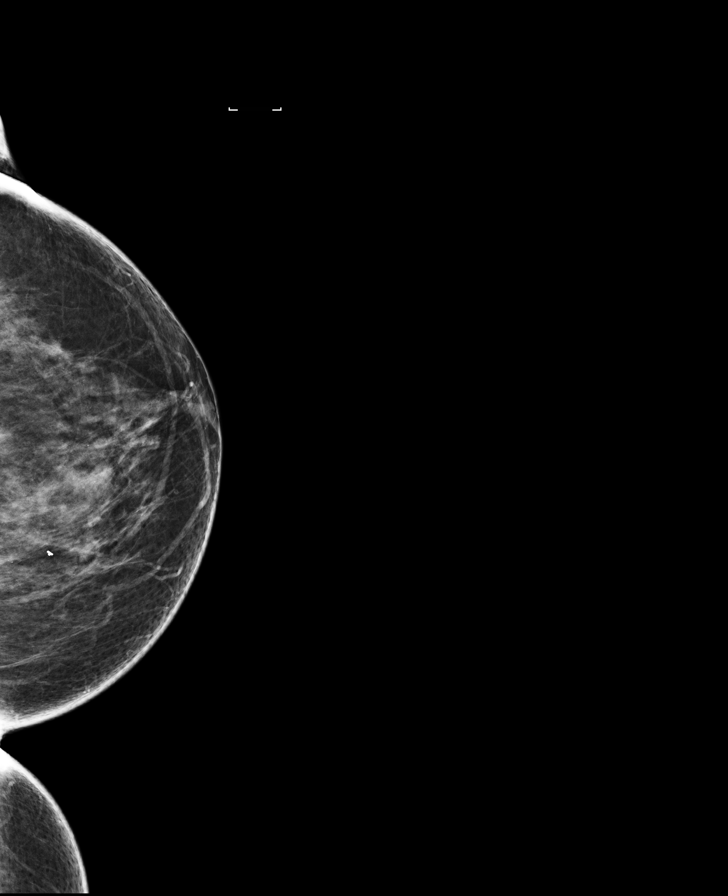

[8 of 28 positions shown; findings below may reference images not displayed]

ACR Breast Density Category c: The breast tissue is heterogeneously
dense, which may obscure small masses.
FINDINGS: There are no findings suspicious for malignancy. Images were
processed with CAD.
IMPRESSION: No mammographic evidence of malignancy. A result letter of this
screening mammogram will be mailed directly to the patient.

RECOMMENDATION:
Screening mammogram in one year. (Code:[id number])

BI-RADS CATEGORY  1: Negative.

## 2018-03-14 ENCOUNTER — Other Ambulatory Visit: Payer: Self-pay | Admitting: Family Medicine

## 2018-03-17 LAB — HM DIABETES EYE EXAM

## 2018-03-19 DIAGNOSIS — Z87891 Personal history of nicotine dependence: Secondary | ICD-10-CM | POA: Diagnosis not present

## 2018-03-19 DIAGNOSIS — H6243 Otitis externa in other diseases classified elsewhere, bilateral: Secondary | ICD-10-CM | POA: Diagnosis not present

## 2018-03-19 DIAGNOSIS — B369 Superficial mycosis, unspecified: Secondary | ICD-10-CM | POA: Diagnosis not present

## 2018-04-02 DIAGNOSIS — H6243 Otitis externa in other diseases classified elsewhere, bilateral: Secondary | ICD-10-CM | POA: Diagnosis not present

## 2018-04-02 DIAGNOSIS — Z87891 Personal history of nicotine dependence: Secondary | ICD-10-CM | POA: Diagnosis not present

## 2018-04-02 DIAGNOSIS — B369 Superficial mycosis, unspecified: Secondary | ICD-10-CM | POA: Diagnosis not present

## 2018-04-16 ENCOUNTER — Other Ambulatory Visit: Payer: Self-pay | Admitting: Family Medicine

## 2018-05-05 DIAGNOSIS — H6240 Otitis externa in other diseases classified elsewhere, unspecified ear: Secondary | ICD-10-CM | POA: Diagnosis not present

## 2018-05-05 DIAGNOSIS — B369 Superficial mycosis, unspecified: Secondary | ICD-10-CM | POA: Diagnosis not present

## 2018-05-12 ENCOUNTER — Other Ambulatory Visit: Payer: Self-pay | Admitting: Family Medicine

## 2018-05-22 ENCOUNTER — Telehealth: Payer: Self-pay

## 2018-05-22 NOTE — Telephone Encounter (Signed)
Copied from [place] [id number]. Topic: General - Call Back - No Documentation >> May 22, 2018  1:58 PM Aurelio Brash B wrote: Reason for CRM: Pt  returned call  no crm  she said it may be about her ffmal...she says its ok to leave voicemail

## 2018-05-22 NOTE — Telephone Encounter (Signed)
Spoke with pt informing her I do not see any messages or results indicating someone from our office was trying to reach her.  Says ok.

## 2018-05-27 DIAGNOSIS — Q762 Congenital spondylolisthesis: Secondary | ICD-10-CM | POA: Diagnosis not present

## 2018-05-30 ENCOUNTER — Ambulatory Visit: Payer: 59 | Admitting: Family Medicine

## 2018-05-30 ENCOUNTER — Encounter: Payer: Self-pay | Admitting: Family Medicine

## 2018-05-30 VITALS — BP 130/70 | HR 100 | Temp 98.6°F | Ht <= 58 in | Wt 166.0 lb

## 2018-05-30 DIAGNOSIS — R1030 Lower abdominal pain, unspecified: Secondary | ICD-10-CM | POA: Diagnosis not present

## 2018-05-30 LAB — CBC WITH DIFFERENTIAL/PLATELET
BASOS ABS: 0 10*3/uL (ref 0.0–0.2)
Basos: 0 %
EOS (ABSOLUTE): 0.1 10*3/uL (ref 0.0–0.4)
Eos: 1 %
Hematocrit: 37.3 % (ref 34.0–46.6)
Hemoglobin: 12.9 g/dL (ref 11.1–15.9)
IMMATURE GRANS (ABS): 0 10*3/uL (ref 0.0–0.1)
IMMATURE GRANULOCYTES: 0 %
LYMPHS: 22 %
Lymphocytes Absolute: 2.4 10*3/uL (ref 0.7–3.1)
MCH: 29.2 pg (ref 26.6–33.0)
MCHC: 34.6 g/dL (ref 31.5–35.7)
MCV: 84 fL (ref 79–97)
MONOCYTES: 8 %
Monocytes Absolute: 0.8 10*3/uL (ref 0.1–0.9)
Neutrophils Absolute: 7.3 10*3/uL — ABNORMAL HIGH (ref 1.4–7.0)
Neutrophils: 69 %
PLATELETS: 260 10*3/uL (ref 150–450)
RBC: 4.42 x10E6/uL (ref 3.77–5.28)
RDW: 14 % (ref 12.3–15.4)
WBC: 10.7 10*3/uL (ref 3.4–10.8)

## 2018-05-30 LAB — POC URINALSYSI DIPSTICK (AUTOMATED)
Bilirubin, UA: NEGATIVE
Blood, UA: NEGATIVE
Glucose, UA: NEGATIVE
Ketones, UA: NEGATIVE
LEUKOCYTES UA: NEGATIVE
Nitrite, UA: NEGATIVE
PH UA: 7 (ref 5.0–8.0)
PROTEIN UA: NEGATIVE
SPEC GRAV UA: 1.015 (ref 1.010–1.025)
UROBILINOGEN UA: 0.2 U/dL

## 2018-05-30 LAB — COMPREHENSIVE METABOLIC PANEL
A/G RATIO: 2 (ref 1.2–2.2)
ALT: 32 IU/L (ref 0–32)
AST: 23 IU/L (ref 0–40)
Albumin: 4.8 g/dL (ref 3.6–4.8)
Alkaline Phosphatase: 81 IU/L (ref 39–117)
BILIRUBIN TOTAL: 0.4 mg/dL (ref 0.0–1.2)
BUN/Creatinine Ratio: 18 (ref 12–28)
BUN: 11 mg/dL (ref 8–27)
CALCIUM: 9.6 mg/dL (ref 8.7–10.3)
CHLORIDE: 97 mmol/L (ref 96–106)
CO2: 24 mmol/L (ref 20–29)
Creatinine, Ser: 0.62 mg/dL (ref 0.57–1.00)
GFR calc Af Amer: 111 mL/min/{1.73_m2} (ref >59)
GFR, EST NON AFRICAN AMERICAN: 96 mL/min/{1.73_m2} (ref >59)
GLUCOSE: 115 mg/dL — AB (ref 65–99)
Globulin, Total: 2.4 g/dL (ref 1.5–4.5)
POTASSIUM: 4 mmol/L (ref 3.5–5.2)
Sodium: 140 mmol/L (ref 134–144)
Total Protein: 7.2 g/dL (ref 6.0–8.5)

## 2018-05-30 LAB — LIPASE: LIPASE: 19 U/L (ref 14–72)

## 2018-05-30 NOTE — Progress Notes (Signed)
BP 130/70 (BP Location: Right Arm, Patient Position: Sitting, Cuff Size: Large)   Pulse 100   Temp 98.6 F (37 C) (Oral)   Ht '4\' 10"'  (1.473 m)   Wt 166 lb (75.3 kg)   SpO2 96%   BMI 34.69 kg/m    CC: lower abd pain Subjective:    Patient ID: Olivia Benton, female    DOB: 1956-08-22, 62 y.o.   MRN: [id number]  HPI: Olivia Benton is a 62 y.o. female presenting on 05/30/2018 for Abdominal Pain (Lower abd pain radiating into lower back. Started yesterday. )   1d h/o lower abd pain. Points to suprapubic area, with some radiation into RLQ. Describes sharp stabbing lower pain with radiation to back intermittently with more constant dull ache. Started yesterday after work. Constipation last week - treated with stool softeners, bottle of magesium citrate - had bowel movement after this. Had normal bowel movements after this. last BM last night.   Denies fevers/chills, urinary symptoms of dysuria, frequency, urgency, no bowel changes, no nausea, vomiting. Appetite ok. Had breakfast this morning.   S/p TAH for endometriosis, ovaries removed. Appendix remains.   Relevant past medical, surgical, family and social history reviewed and updated as indicated. Interim medical history since our last visit reviewed. Allergies and medications reviewed and updated. Outpatient Medications Prior to Visit  Medication Sig Dispense Refill  . Blood Glucose Monitoring Suppl (ONE TOUCH ULTRA MINI) w/Device KIT Use to check sugar once daily. Dx: E11.9 100 each 3  . carisoprodol (SOMA) 350 MG tablet QHS PRN - reported on 05/24/2016  0  . cholecalciferol (VITAMIN D) 1000 UNITS tablet Take 1,000 Units by mouth daily.    Marland Kitchen desonide (DESOWEN) 0.05 % cream apply to affected area twice a day 30 g 0  . fluticasone (FLONASE) 50 MCG/ACT nasal spray USE 2 SPRAYS IN EACH  NOSTRIL DAILY 48 g 0  . fluticasone (FLONASE) 50 MCG/ACT nasal spray USE 2 SPRAYS IN EACH  NOSTRIL DAILY 48 g 1  . glucose blood (ONE TOUCH  ULTRA TEST) test strip Use to check sugar once daily. Dx: E11.9 100 each 3  . Hydrocodone-Acetaminophen 5-300 MG TABS as needed.   0  . levocetirizine (XYZAL) 5 MG tablet Take 5 mg by mouth every evening.    Marland Kitchen losartan-hydrochlorothiazide (HYZAAR) 100-12.5 MG tablet TAKE 1 TABLET BY MOUTH  DAILY 90 tablet 3  . metFORMIN (GLUCOPHAGE) 500 MG tablet TAKE 1 TABLET BY MOUTH  DAILY 90 tablet 1  . montelukast (SINGULAIR) 10 MG tablet TAKE 1 TABLET BY MOUTH AT  BEDTIME 90 tablet 3  . Multiple Minerals-Vitamins (CALCIUM & VIT D3 BONE HEALTH PO) Take by mouth daily.    Marland Kitchen omeprazole (PRILOSEC) 40 MG capsule Take 1 capsule (40 mg total) by mouth every other day.    Marland Kitchen omeprazole (PRILOSEC) 40 MG capsule TAKE 1 CAPSULE BY MOUTH  DAILY 90 capsule 3  . ONETOUCH DELICA LANCETS 68L MISC Use to check sugar once daily. Dx: E11.9 100 each 3  . tretinoin (RETIN-A) 0.05 % cream Apply 1 application topically at bedtime.    . vitamin C (ASCORBIC ACID) 500 MG tablet Take 500 mg by mouth daily.     No facility-administered medications prior to visit.      Per HPI unless specifically indicated in ROS section below Review of Systems     Objective:    BP 130/70 (BP Location: Right Arm, Patient Position: Sitting, Cuff Size: Large)   Pulse 100  Temp 98.6 F (37 C) (Oral)   Ht '4\' 10"'  (1.473 m)   Wt 166 lb (75.3 kg)   SpO2 96%   BMI 34.69 kg/m   Wt Readings from Last 3 Encounters:  05/30/18 166 lb (75.3 kg)  02/07/18 166 lb (75.3 kg)  09/02/17 163 lb (73.9 kg)    Physical Exam  Constitutional: She appears well-developed and well-nourished. No distress.  HENT:  Head: Normocephalic and atraumatic.  Mouth/Throat: Oropharynx is clear and moist. No oropharyngeal exudate.  Eyes: Pupils are equal, round, and reactive to light. EOM are normal.  Cardiovascular: Normal rate, regular rhythm and normal heart sounds.  No murmur heard. Pulmonary/Chest: Effort normal and breath sounds normal. No respiratory distress.  She has no wheezes. She has no rhonchi. She has no rales.  Abdominal: Soft. Normal appearance, normal aorta and bowel sounds are normal. There is no hepatosplenomegaly. There is generalized tenderness (mild-mod). There is no rigidity, no rebound, no guarding, no CVA tenderness and negative Murphy's sign.  Nursing note and vitals reviewed.  Results for orders placed or performed in visit on 05/30/18  POCT Urinalysis Dipstick (Automated)  Result Value Ref Range   Color, UA yellow    Clarity, UA clear    Glucose, UA Negative Negative   Bilirubin, UA negative    Ketones, UA negative    Spec Grav, UA 1.015 1.010 - 1.025   Blood, UA negative    pH, UA 7.0 5.0 - 8.0   Protein, UA Negative Negative   Urobilinogen, UA 0.2 0.2 or 1.0 E.U./dL   Nitrite, UA negative    Leukocytes, UA Negative Negative      Assessment & Plan:   Problem List Items Addressed This Visit    Lower abdominal pain - Primary    Bowel changes last week (constipation) treated with mag citrate with relief, has had normal BMs since, now with 1d h/o lower abdominal pain with radiation to lower back. Today has good appetite and does not have acute abdomen pointing against appendicitis. UA normal pointing against UTI. Will check stat labs (CBC, CMP, lipase) and recommend clear liquid diet x 24 hours then bland diet over weekend. ER precautions reviewed. Pt agrees with plan      Relevant Orders   POCT Urinalysis Dipstick (Automated) (Completed)   Lipase   Comprehensive metabolic panel   CBC with Differential       No orders of the defined types were placed in this encounter.  Orders Placed This Encounter  Procedures  . Lipase  . Comprehensive metabolic panel  . CBC with Differential  . POCT Urinalysis Dipstick (Automated)    Follow up plan: No follow-ups on file.  Ria Bush, MD

## 2018-05-30 NOTE — Patient Instructions (Addendum)
Urine looked ok today.  Labs today.  I recommend clear liquid diet for next day then bland diet over weekend.  If worsening abdominal pain, go to ER.

## 2018-05-30 NOTE — Assessment & Plan Note (Signed)
Bowel changes last week (constipation) treated with mag citrate with relief, has had normal BMs since, now with 1d h/o lower abdominal pain with radiation to lower back. Today has good appetite and does not have acute abdomen pointing against appendicitis. UA normal pointing against UTI. Will check stat labs (CBC, CMP, lipase) and recommend clear liquid diet x 24 hours then bland diet over weekend. ER precautions reviewed. Pt agrees with plan

## 2018-07-27 LAB — LIPID PANEL
Cholesterol: 166 (ref 0–200)
HDL: 29 — AB (ref 35–70)
LDL CALC: 111
Triglycerides: 131 (ref 40–160)

## 2018-07-27 LAB — BASIC METABOLIC PANEL: CREATININE: 0.8 (ref 0.5–1.1)

## 2018-07-27 LAB — HEMOGLOBIN A1C: HEMOGLOBIN A1C: 6.3 — AB (ref 4.0–6.0)

## 2018-08-08 ENCOUNTER — Ambulatory Visit: Payer: 59 | Admitting: Family Medicine

## 2018-08-08 ENCOUNTER — Encounter: Payer: Self-pay | Admitting: Family Medicine

## 2018-08-08 VITALS — BP 120/68 | HR 75 | Temp 98.2°F | Ht <= 58 in | Wt 161.5 lb

## 2018-08-08 DIAGNOSIS — E119 Type 2 diabetes mellitus without complications: Secondary | ICD-10-CM

## 2018-08-08 DIAGNOSIS — E785 Hyperlipidemia, unspecified: Secondary | ICD-10-CM

## 2018-08-08 DIAGNOSIS — E669 Obesity, unspecified: Secondary | ICD-10-CM

## 2018-08-08 DIAGNOSIS — E66811 Obesity, class 1: Secondary | ICD-10-CM

## 2018-08-08 DIAGNOSIS — E1169 Type 2 diabetes mellitus with other specified complication: Secondary | ICD-10-CM | POA: Insufficient documentation

## 2018-08-08 NOTE — Progress Notes (Signed)
BP 120/68 (BP Location: Left Arm, Patient Position: Sitting, Cuff Size: Normal)   Pulse 75   Temp 98.2 F (36.8 C) (Oral)   Ht '4\' 10"'  (1.473 m)   Wt 161 lb 8 oz (73.3 kg)   SpO2 96%   BMI 33.75 kg/m    CC: DM f/u visit Subjective:    Patient ID: Olivia Benton, female    DOB: [birth year], 62 y.o.   MRN: [id number]  HPI: Olivia Benton is a 62 y.o. female presenting on 08/08/2018 for Diabetes (Here for f/u. Provided a copy (for [place]) of recent labs.) and Form Completion (Provided health form to be completed.)   Brings labs from work - will be scanned. LDL 11. A1c 6.3%. Tried keto diet - lost 5 lbs.   DM - does not regularly check sugars, 125 this morning. Compliant with antihyperglycemic regimen which includes: metformin 539m daily. Denies low sugars or hypoglycemic symptoms. Denies paresthesias. Last diabetic eye exam 03/2018. Pneumovax: 2018. Prevnar: not due yet. Glucometer brand: one-touch. DSME: completed initially (2004). Lab Results  Component Value Date   HGBA1C 6.8 (H) 02/19/2018   Diabetic Foot Exam - Simple   Simple Foot Form Diabetic Foot exam was performed with the following findings:  Yes 08/08/2018  8:39 AM  Visual Inspection No deformities, no ulcerations, no other skin breakdown bilaterally:  Yes Sensation Testing Intact to touch and monofilament testing bilaterally:  Yes Pulse Check Posterior Tibialis and Dorsalis pulse intact bilaterally:  Yes Comments    Lab Results  Component Value Date   MICROALBUR <0.7 07/25/2015     Relevant past medical, surgical, family and social history reviewed and updated as indicated. Interim medical history since our last visit reviewed. Allergies and medications reviewed and updated. Outpatient Medications Prior to Visit  Medication Sig Dispense Refill  . Blood Glucose Monitoring Suppl (ONE TOUCH ULTRA MINI) w/Device KIT Use to check sugar once daily. Dx: E11.9 100 each 3  . carisoprodol (SOMA) 350 MG tablet QHS  PRN - reported on 05/24/2016  0  . cholecalciferol (VITAMIN D) 1000 UNITS tablet Take 1,000 Units by mouth daily.    .Marland Kitchendesonide (DESOWEN) 0.05 % cream apply to affected area twice a day 30 g 0  . fluticasone (FLONASE) 50 MCG/ACT nasal spray USE 2 SPRAYS IN EACH  NOSTRIL DAILY 48 g 0  . fluticasone (FLONASE) 50 MCG/ACT nasal spray USE 2 SPRAYS IN EACH  NOSTRIL DAILY 48 g 1  . glucose blood (ONE TOUCH ULTRA TEST) test strip Use to check sugar once daily. Dx: E11.9 100 each 3  . Hydrocodone-Acetaminophen 5-300 MG TABS as needed.   0  . levocetirizine (XYZAL) 5 MG tablet Take 5 mg by mouth every evening.    .Marland Kitchenlosartan-hydrochlorothiazide (HYZAAR) 100-12.5 MG tablet TAKE 1 TABLET BY MOUTH  DAILY 90 tablet 3  . metFORMIN (GLUCOPHAGE) 500 MG tablet TAKE 1 TABLET BY MOUTH  DAILY 90 tablet 1  . montelukast (SINGULAIR) 10 MG tablet TAKE 1 TABLET BY MOUTH AT  BEDTIME 90 tablet 3  . Multiple Minerals-Vitamins (CALCIUM & VIT D3 BONE HEALTH PO) Take by mouth daily.    .Marland Kitchenomeprazole (PRILOSEC) 40 MG capsule Take 1 capsule (40 mg total) by mouth every other day.    .Glory RosebushDELICA LANCETS 339EMISC Use to check sugar once daily. Dx: E11.9 100 each 3  . tretinoin (RETIN-A) 0.05 % cream Apply 1 application topically at bedtime.    . vitamin C (ASCORBIC ACID) 500 MG  tablet Take 500 mg by mouth daily.    Marland Kitchen omeprazole (PRILOSEC) 40 MG capsule TAKE 1 CAPSULE BY MOUTH  DAILY 90 capsule 3   No facility-administered medications prior to visit.      Per HPI unless specifically indicated in ROS section below Review of Systems     Objective:    BP 120/68 (BP Location: Left Arm, Patient Position: Sitting, Cuff Size: Normal)   Pulse 75   Temp 98.2 F (36.8 C) (Oral)   Ht '4\' 10"'  (1.473 m)   Wt 161 lb 8 oz (73.3 kg)   SpO2 96%   BMI 33.75 kg/m   Wt Readings from Last 3 Encounters:  08/08/18 161 lb 8 oz (73.3 kg)  05/30/18 166 lb (75.3 kg)  02/07/18 166 lb (75.3 kg)    Physical Exam  Constitutional: She  appears well-developed and well-nourished. No distress.  HENT:  Head: Normocephalic and atraumatic.  Right Ear: External ear normal.  Left Ear: External ear normal.  Nose: Nose normal.  Mouth/Throat: Oropharynx is clear and moist. No oropharyngeal exudate.  Eyes: Pupils are equal, round, and reactive to light. Conjunctivae and EOM are normal. No scleral icterus.  Neck: Normal range of motion. Neck supple.  Cardiovascular: Normal rate, regular rhythm, normal heart sounds and intact distal pulses.  No murmur heard. Pulmonary/Chest: Effort normal and breath sounds normal. No respiratory distress. She has no wheezes. She has no rales.  Musculoskeletal: She exhibits no edema.  See HPI for foot exam if done  Lymphadenopathy:    She has no cervical adenopathy.  Skin: Skin is warm and dry. No rash noted.  Psychiatric: She has a normal mood and affect.  Nursing note and vitals reviewed.  Results for orders placed or performed in visit on 08/08/18  HM DIABETES EYE EXAM  Result Value Ref Range   HM Diabetic Eye Exam No Retinopathy No Retinopathy      Assessment & Plan:   Problem List Items Addressed This Visit    Obesity, Class I, BMI 30-34.9    Congratulated on weight loss. Healthy alternative to goal BMI exemption form filled out today. She will follow diabetic (low carb low sugar) diet.       Dyslipidemia    Chronic off meds. LDL 111, above goal. rec low chol diet, will consider starting statin if levels not improved by next check.  The 10-year ASCVD risk score Mikey Bussing DC Brooke Bonito., et al., 2013) is: 13.1%   Values used to calculate the score:     Age: 24 years     Sex: Female     Is Non-Hispanic African American: No     Diabetic: Yes     Tobacco smoker: No     Systolic Blood Pressure: 628 mmHg     Is BP treated: Yes     HDL Cholesterol: 26 mg/dL     Total Cholesterol: 166 mg/dL       Diabetes mellitus type 2, controlled (HCC) - Primary    Chronic, stable, good A1c control (work  labs). Continue current regimen.           No orders of the defined types were placed in this encounter.  Orders Placed This Encounter  Procedures  . [facility] DIABETES EYE EXAM    This external order was created through the Results Console.    Follow up plan: Return in about 6 months (around 02/08/2019) for annual exam, prior fasting for blood work.  Ria Bush, MD

## 2018-08-08 NOTE — Patient Instructions (Addendum)
I recommend low cholesterol diet, and low carb diet. We will recheck cholesterol levels next visit.  You are doing well today. Return as needed or in 6 months for physical.

## 2018-08-08 NOTE — Assessment & Plan Note (Signed)
Chronic, stable, good A1c control (work labs). Continue current regimen.

## 2018-08-08 NOTE — Assessment & Plan Note (Addendum)
Chronic off meds. LDL 111, above goal. rec low chol diet, will consider starting statin if levels not improved by next check.  The 10-year ASCVD risk score Mikey Bussing DC Brooke Bonito., et al., 2013) is: 13.1%   Values used to calculate the score:     Age: 62 years     Sex: Female     Is Non-Hispanic African American: No     Diabetic: Yes     Tobacco smoker: No     Systolic Blood Pressure: 466 mmHg     Is BP treated: Yes     HDL Cholesterol: 26 mg/dL     Total Cholesterol: 166 mg/dL

## 2018-08-08 NOTE — Assessment & Plan Note (Signed)
Congratulated on weight loss. Healthy alternative to goal BMI exemption form filled out today. She will follow diabetic (low carb low sugar) diet.

## 2018-09-02 DIAGNOSIS — H6123 Impacted cerumen, bilateral: Secondary | ICD-10-CM | POA: Diagnosis not present

## 2018-09-03 DIAGNOSIS — Q762 Congenital spondylolisthesis: Secondary | ICD-10-CM | POA: Diagnosis not present

## 2018-09-03 DIAGNOSIS — M4127 Other idiopathic scoliosis, lumbosacral region: Secondary | ICD-10-CM | POA: Diagnosis not present

## 2018-09-04 ENCOUNTER — Other Ambulatory Visit: Payer: Self-pay | Admitting: Family Medicine

## 2018-09-17 ENCOUNTER — Other Ambulatory Visit: Payer: Self-pay | Admitting: Family Medicine

## 2018-09-23 ENCOUNTER — Other Ambulatory Visit: Payer: Self-pay | Admitting: Family Medicine

## 2018-09-24 DIAGNOSIS — H938X2 Other specified disorders of left ear: Secondary | ICD-10-CM | POA: Diagnosis not present

## 2018-09-24 DIAGNOSIS — B369 Superficial mycosis, unspecified: Secondary | ICD-10-CM | POA: Diagnosis not present

## 2018-09-24 DIAGNOSIS — H6241 Otitis externa in other diseases classified elsewhere, right ear: Secondary | ICD-10-CM | POA: Diagnosis not present

## 2018-09-26 ENCOUNTER — Other Ambulatory Visit: Payer: Self-pay | Admitting: Family Medicine

## 2018-09-26 DIAGNOSIS — Z23 Encounter for immunization: Secondary | ICD-10-CM | POA: Diagnosis not present

## 2018-10-02 ENCOUNTER — Other Ambulatory Visit: Payer: Self-pay | Admitting: Family Medicine

## 2018-10-02 DIAGNOSIS — Z1231 Encounter for screening mammogram for malignant neoplasm of breast: Secondary | ICD-10-CM

## 2018-12-01 ENCOUNTER — Telehealth: Payer: Self-pay | Admitting: Family Medicine

## 2018-12-01 MED ORDER — VALSARTAN 160 MG PO TABS: 160.0000 mg | ORAL_TABLET | Freq: Every day | ORAL | 1 refills | Status: DC

## 2018-12-01 NOTE — Telephone Encounter (Signed)
I've sent in valsartan to take 160mg  daily in place of losartan. Sent to mail order.

## 2018-12-01 NOTE — Telephone Encounter (Signed)
Pt stated she has 2 wks of Losartan 100 mg left to take    Pt stated the pharmacy no longer has the losartan and they need another alternative medication for the pt to take. Please advise. Wood

## 2018-12-02 NOTE — Telephone Encounter (Signed)
Spoke with pt relaying message per Dr. G. Pt verbalizes understanding. 

## 2018-12-03 DIAGNOSIS — M4127 Other idiopathic scoliosis, lumbosacral region: Secondary | ICD-10-CM | POA: Diagnosis not present

## 2018-12-08 ENCOUNTER — Ambulatory Visit
Admission: RE | Admit: 2018-12-08 | Discharge: 2018-12-08 | Disposition: A | Discharge status: Home / Self Care | Payer: 59 | Source: Ambulatory Visit | Attending: Family Medicine | Admitting: Family Medicine

## 2018-12-08 DIAGNOSIS — Z1231 Encounter for screening mammogram for malignant neoplasm of breast: Secondary | ICD-10-CM | POA: Diagnosis not present

## 2018-12-08 LAB — HM MAMMOGRAPHY

## 2018-12-09 ENCOUNTER — Encounter: Payer: Self-pay | Admitting: Family Medicine

## 2018-12-15 ENCOUNTER — Telehealth: Payer: Self-pay | Admitting: *Deleted

## 2018-12-15 MED ORDER — HYDROCHLOROTHIAZIDE 12.5 MG PO CAPS: 12.5000 mg | ORAL_CAPSULE | Freq: Every day | ORAL | 1 refills | Status: DC

## 2018-12-15 NOTE — Telephone Encounter (Signed)
I've sent in hydrochlorothiazide 12.5mg  to mail order.  She needs to check BP to ensure well controlled on current regimen.  May send in combo valsartan hctz next time as this is available.

## 2018-12-15 NOTE — Telephone Encounter (Signed)
Spoke with pt relaying Dr. Synthia Innocent message.  Pt verbalizes understanding and expresses her thanks for the call back.

## 2018-12-15 NOTE — Telephone Encounter (Signed)
She is only wanting a Rx for the diuretic. She stated that the combo pill was on national backorder and they were unaware when it would be in stock, so she would rather take it in 2 separate tabs. Diuretic can be sent to mail order. I did advise her when I previously spoke with her, that it would be best if she could check her BP

## 2018-12-15 NOTE — Telephone Encounter (Signed)
Spoke to pt who states her BP was recently changed from Hyzaar to Valsartan. She states since she d/c her diuretic, she has gained appx 3lbs and is wanting to know if she can have an Rx sent to mail order. Pt has not been able to take her BP, and is unaware if there has been any increase. pls advise

## 2018-12-15 NOTE — Telephone Encounter (Signed)
plz clarify - does she want valsartan to mail order or hyzaar?  Do recommend she check BP to see if we need to make any med changes. May send valsartan/hctz combo pill in place of plain valsartan if that's what she wants.

## 2018-12-17 LAB — HM DIABETES EYE EXAM

## 2018-12-22 DIAGNOSIS — E113393 Type 2 diabetes mellitus with moderate nonproliferative diabetic retinopathy without macular edema, bilateral: Secondary | ICD-10-CM | POA: Diagnosis not present

## 2019-02-10 ENCOUNTER — Ambulatory Visit (INDEPENDENT_AMBULATORY_CARE_PROVIDER_SITE_OTHER): Payer: 59 | Admitting: Family Medicine

## 2019-02-10 ENCOUNTER — Encounter: Payer: Self-pay | Admitting: Family Medicine

## 2019-02-10 VITALS — BP 130/76 | HR 75 | Temp 98.2°F | Ht <= 58 in | Wt 167.5 lb

## 2019-02-10 DIAGNOSIS — I10 Essential (primary) hypertension: Secondary | ICD-10-CM

## 2019-02-10 DIAGNOSIS — E559 Vitamin D deficiency, unspecified: Secondary | ICD-10-CM

## 2019-02-10 DIAGNOSIS — E119 Type 2 diabetes mellitus without complications: Secondary | ICD-10-CM | POA: Diagnosis not present

## 2019-02-10 DIAGNOSIS — E669 Obesity, unspecified: Secondary | ICD-10-CM

## 2019-02-10 DIAGNOSIS — E785 Hyperlipidemia, unspecified: Secondary | ICD-10-CM

## 2019-02-10 DIAGNOSIS — E66811 Obesity, class 1: Secondary | ICD-10-CM

## 2019-02-10 DIAGNOSIS — Z7189 Other specified counseling: Secondary | ICD-10-CM

## 2019-02-10 DIAGNOSIS — Z0000 Encounter for general adult medical examination without abnormal findings: Secondary | ICD-10-CM

## 2019-02-10 DIAGNOSIS — Z636 Dependent relative needing care at home: Secondary | ICD-10-CM

## 2019-02-10 MED ORDER — BUPROPION HCL ER (SR) 100 MG PO TB12: 100.0000 mg | ORAL_TABLET | Freq: Every day | ORAL | 6 refills | Status: DC

## 2019-02-10 NOTE — Assessment & Plan Note (Signed)
Chronic, stable. Continue metformin. Update A1c today.

## 2019-02-10 NOTE — Assessment & Plan Note (Addendum)
Advanced directive discussion - has not set up. Discussed. HCPOA would be local daughter. Advanced directive packet provided today.  

## 2019-02-10 NOTE — Patient Instructions (Addendum)
Labs today.  If interested, either let us know or check with local pharmacy about new 2 shot shingles series (shingrix).  Try wellbutrin in the mornings for mood.  Advanced directive packet provided today.   Health Maintenance for Postmenopausal Women Menopause is a normal process in which your reproductive ability comes to an end. This process happens gradually over a span of months to years, usually between the ages of 45 and 69. Menopause is complete when you have missed 12 consecutive menstrual periods. It is important to talk with your health care provider about some of the most common conditions that affect postmenopausal women, such as heart disease, cancer, and bone loss (osteoporosis). Adopting a healthy lifestyle and getting preventive care can help to promote your health and wellness. Those actions can also lower your chances of developing some of these common conditions. What should I know about menopause? During menopause, you may experience a number of symptoms, such as:  Moderate-to-severe hot flashes.  Night sweats.  Decrease in sex drive.  Mood swings.  Headaches.  Tiredness.  Irritability.  Memory problems.  Insomnia. Choosing to treat or not to treat menopausal changes is an individual decision that you make with your health care provider. What should I know about hormone replacement therapy and supplements? Hormone therapy products are effective for treating symptoms that are associated with menopause, such as hot flashes and night sweats. Hormone replacement carries certain risks, especially as you become older. If you are thinking about using estrogen or estrogen with progestin treatments, discuss the benefits and risks with your health care provider. What should I know about heart disease and stroke? Heart disease, heart attack, and stroke become more likely as you age. This may be due, in part, to the hormonal changes that your body experiences during  menopause. These can affect how your body processes dietary fats, triglycerides, and cholesterol. Heart attack and stroke are both medical emergencies. There are many things that you can do to help prevent heart disease and stroke:  Have your blood pressure checked at least every 1-2 years. High blood pressure causes heart disease and increases the risk of stroke.  If you are 62-78 years old, ask your health care provider if you should take aspirin to prevent a heart attack or a stroke.  Do not use any tobacco products, including cigarettes, chewing tobacco, or electronic cigarettes. If you need help quitting, ask your health care provider.  It is important to eat a healthy diet and maintain a healthy weight. ? Be sure to include plenty of vegetables, fruits, low-fat dairy products, and lean protein. ? Avoid eating foods that are high in solid fats, added sugars, or salt (sodium).  Get regular exercise. This is one of the most important things that you can do for your health. ? Try to exercise for at least 150 minutes each week. The type of exercise that you do should increase your heart rate and make you sweat. This is known as moderate-intensity exercise. ? Try to do strengthening exercises at least twice each week. Do these in addition to the moderate-intensity exercise.  Know your numbers.Ask your health care provider to check your cholesterol and your blood glucose. Continue to have your blood tested as directed by your health care provider.  What should I know about cancer screening? There are several types of cancer. Take the following steps to reduce your risk and to catch any cancer development as early as possible. Breast Cancer  Practice breast self-awareness. ?  This means understanding how your breasts normally appear and feel. ? It also means doing regular breast self-exams. Let your health care provider know about any changes, no matter how small.  If you are 81 or older,  have a clinician do a breast exam (clinical breast exam or CBE) every year. Depending on your age, family history, and medical history, it may be recommended that you also have a yearly breast X-ray (mammogram).  If you have a family history of breast cancer, talk with your health care provider about genetic screening.  If you are at high risk for breast cancer, talk with your health care provider about having an MRI and a mammogram every year.  Breast cancer (BRCA) gene test is recommended for women who have family members with BRCA-related cancers. Results of the assessment will determine the need for genetic counseling and BRCA1 and for BRCA2 testing. BRCA-related cancers include these types: ? Breast. This occurs in males or females. ? Ovarian. ? Tubal. This may also be called fallopian tube cancer. ? Cancer of the abdominal or pelvic lining (peritoneal cancer). ? Prostate. ? Pancreatic. Cervical, Uterine, and Ovarian Cancer Your health care provider may recommend that you be screened regularly for cancer of the pelvic organs. These include your ovaries, uterus, and vagina. This screening involves a pelvic exam, which includes checking for microscopic changes to the surface of your cervix (Pap test).  For women ages 21-65, health care providers may recommend a pelvic exam and a Pap test every three years. For women ages 46-65, they may recommend the Pap test and pelvic exam, combined with testing for human papilloma virus (HPV), every five years. Some types of HPV increase your risk of cervical cancer. Testing for HPV may also be done on women of any age who have unclear Pap test results.  Other health care providers may not recommend any screening for nonpregnant women who are considered low risk for pelvic cancer and have no symptoms. Ask your health care provider if a screening pelvic exam is right for you.  If you have had past treatment for cervical cancer or a condition that could lead  to cancer, you need Pap tests and screening for cancer for at least 20 years after your treatment. If Pap tests have been discontinued for you, your risk factors (such as having a new sexual partner) need to be reassessed to determine if you should start having screenings again. Some women have medical problems that increase the chance of getting cervical cancer. In these cases, your health care provider may recommend that you have screening and Pap tests more often.  If you have a family history of uterine cancer or ovarian cancer, talk with your health care provider about genetic screening.  If you have vaginal bleeding after reaching menopause, tell your health care provider.  There are currently no reliable tests available to screen for ovarian cancer. Lung Cancer Lung cancer screening is recommended for adults 54-47 years old who are at high risk for lung cancer because of a history of smoking. A yearly low-dose CT scan of the lungs is recommended if you:  Currently smoke.  Have a history of at least 30 pack-years of smoking and you currently smoke or have quit within the past 15 years. A pack-year is smoking an average of one pack of cigarettes per day for one year. Yearly screening should:  Continue until it has been 15 years since you quit.  Stop if you develop a health problem  that would prevent you from having lung cancer treatment. Colorectal Cancer  This type of cancer can be detected and can often be prevented.  Routine colorectal cancer screening usually begins at age 106 and continues through age 33.  If you have risk factors for colon cancer, your health care provider may recommend that you be screened at an earlier age.  If you have a family history of colorectal cancer, talk with your health care provider about genetic screening.  Your health care provider may also recommend using home test kits to check for hidden blood in your stool.  A small camera at the end of a  tube can be used to examine your colon directly (sigmoidoscopy or colonoscopy). This is done to check for the earliest forms of colorectal cancer.  Direct examination of the colon should be repeated every 5-10 years until age 64. However, if early forms of precancerous polyps or small growths are found or if you have a family history or genetic risk for colorectal cancer, you may need to be screened more often. Skin Cancer  Check your skin from head to toe regularly.  Monitor any moles. Be sure to tell your health care provider: ? About any new moles or changes in moles, especially if there is a change in a mole's shape or color. ? If you have a mole that is larger than the size of a pencil eraser.  If any of your family members has a history of skin cancer, especially at a young age, talk with your health care provider about genetic screening.  Always use sunscreen. Apply sunscreen liberally and repeatedly throughout the day.  Whenever you are outside, protect yourself by wearing long sleeves, pants, a wide-brimmed hat, and sunglasses. What should I know about osteoporosis? Osteoporosis is a condition in which bone destruction happens more quickly than new bone creation. After menopause, you may be at an increased risk for osteoporosis. To help prevent osteoporosis or the bone fractures that can happen because of osteoporosis, the following is recommended:  If you are 47-70 years old, get at least 1,000 mg of calcium and at least 600 mg of vitamin D per day.  If you are older than age 67 but younger than age 48, get at least 1,200 mg of calcium and at least 600 mg of vitamin D per day.  If you are older than age 13, get at least 1,200 mg of calcium and at least 800 mg of vitamin D per day. Smoking and excessive alcohol intake increase the risk of osteoporosis. Eat foods that are rich in calcium and vitamin D, and do weight-bearing exercises several times each week as directed by your health  care provider. What should I know about how menopause affects my mental health? Depression may occur at any age, but it is more common as you become older. Common symptoms of depression include:  Low or sad mood.  Changes in sleep patterns.  Changes in appetite or eating patterns.  Feeling an overall lack of motivation or enjoyment of activities that you previously enjoyed.  Frequent crying spells. Talk with your health care provider if you think that you are experiencing depression. What should I know about immunizations? It is important that you get and maintain your immunizations. These include:  Tetanus, diphtheria, and pertussis (Tdap) booster vaccine.  Influenza every year before the flu season begins.  Pneumonia vaccine.  Shingles vaccine. Your health care provider may also recommend other immunizations. This information is not intended  to replace advice given to you by your health care provider. Make sure you discuss any questions you have with your health care provider. Document Released: 01/25/2006 Document Revised: 06/22/2016 Document Reviewed: 09/06/2015 Elsevier Interactive Patient Education  2019 [employer].

## 2019-02-10 NOTE — Assessment & Plan Note (Addendum)
Support again provided. Discussed importance of preventing caregiver burnout. Recommended she plan vacation for this year. Will Rx wellbutrin to trial, discussed possible side effects. No seizure hx.

## 2019-02-10 NOTE — Assessment & Plan Note (Signed)
Update levels. Continue vit D 1000 IU daily.

## 2019-02-10 NOTE — Progress Notes (Signed)
BP 130/76 (BP Location: Left Arm, Patient Position: Sitting, Cuff Size: Normal)   Pulse 75   Temp 98.2 F (36.8 C) (Oral)   Ht '4\' 10"'  (1.473 m)   Wt 167 lb 8 oz (76 kg)   SpO2 97%   BMI 35.01 kg/m    CC: CPE Subjective:    Patient ID: Olivia Benton, female    DOB: [birth year], 63 y.o.   MRN: [id number]  HPI: Olivia Benton is a 63 y.o. female presenting on 02/10/2019 for Annual Exam   Stressful caring for her aging mother Kirkland Hun). Interested in medication to help mood. Paxil previously did not like.   Dr Carloyn Manner her neurosurgeon is soon retiring.   Preventative: Colon cancer screening -pt states h/o normal colonoscopy with diverticulosis 2014 by Dr Tamala Julian surgery, no records available. rec rpt 10 yrs.  Breast cancer screening -11/2018 WNL. She does breast exams at home. Well woman exam -pap WNL 2013. S/p complete hysterectomy 1991 for endometriosis. She did have HRT for a year. H/o DVT as teenager when on OCP. Lung cancer screening -thinks h/o 30 PY history. Discussed. Declines screening at this time.  DEXA scan -to consider, declines at this time. Flu shotyearly Tetanus shot -declines.  Pneumovax - 08/2017.  shingrix - discussed  Advanced directive discussion - has not set up. Discussed. HCPOA would be local daughter. Advanced directive packet provided today.  Seat belt use discussed Sunscreen use discussed, no changing moles.  Ex smoker - quit 2010, prior ~30 PY hx. Children still smoke Alcohol - none  Dentist q6 mo Eye exam yearly  Lives alone, no pets Occ: [facility] Activity: no regular exercise  Diet: good water, fruits/vegetables daily     Relevant past medical, surgical, family and social history reviewed and updated as indicated. Interim medical history since our last visit reviewed. Allergies and medications reviewed and updated. Outpatient Medications Prior to Visit  Medication Sig Dispense Refill  . Blood Glucose Monitoring Suppl (ONE  TOUCH ULTRA MINI) w/Device KIT Use to check sugar once daily. Dx: E11.9 100 each 3  . carisoprodol (SOMA) 350 MG tablet QHS PRN - reported on 05/24/2016  0  . cholecalciferol (VITAMIN D) 1000 UNITS tablet Take 1,000 Units by mouth daily.    Marland Kitchen desonide (DESOWEN) 0.05 % cream apply to affected area twice a day 30 g 0  . fluticasone (FLONASE) 50 MCG/ACT nasal spray USE 2 SPRAYS IN EACH  NOSTRIL DAILY 48 g 1  . glucose blood (ONE TOUCH ULTRA TEST) test strip Use to check sugar once daily. Dx: E11.9 100 each 3  . hydrochlorothiazide (MICROZIDE) 12.5 MG capsule Take 1 capsule (12.5 mg total) by mouth daily. 90 capsule 1  . Hydrocodone-Acetaminophen 5-300 MG TABS as needed.   0  . levocetirizine (XYZAL) 5 MG tablet Take 5 mg by mouth every evening.    . metFORMIN (GLUCOPHAGE) 500 MG tablet TAKE 1 TABLET BY MOUTH  DAILY 90 tablet 1  . montelukast (SINGULAIR) 10 MG tablet TAKE 1 TABLET BY MOUTH AT  BEDTIME 90 tablet 1  . Multiple Minerals-Vitamins (CALCIUM & VIT D3 BONE HEALTH PO) Take by mouth daily.    Marland Kitchen omeprazole (PRILOSEC) 40 MG capsule TAKE 1 CAPSULE BY MOUTH  DAILY 90 capsule 1  . ONETOUCH DELICA LANCETS 46K MISC Use to check sugar once daily. Dx: E11.9 100 each 3  . tretinoin (RETIN-A) 0.05 % cream Apply 1 application topically at bedtime.    . valsartan (DIOVAN) 160 MG tablet  Take 1 tablet (160 mg total) by mouth daily. 90 tablet 1  . vitamin C (ASCORBIC ACID) 500 MG tablet Take 500 mg by mouth daily.    . valsartan (DIOVAN) 160 MG tablet Take 1 tablet by mouth daily.    . fluticasone (FLONASE) 50 MCG/ACT nasal spray USE 2 SPRAYS IN EACH  NOSTRIL DAILY 48 g 0  . fluticasone (FLONASE) 50 MCG/ACT nasal spray USE 2 SPRAYS IN EACH  NOSTRIL DAILY 48 g 1  . losartan-hydrochlorothiazide (HYZAAR) 100-12.5 MG tablet TAKE 1 TABLET BY MOUTH  DAILY 90 tablet 3   No facility-administered medications prior to visit.      Per HPI unless specifically indicated in ROS section below Review of Systems    Constitutional: Negative for activity change, appetite change, chills, fatigue, fever and unexpected weight change.  HENT: Negative for hearing loss.   Eyes: Negative for visual disturbance.  Respiratory: Negative for cough, chest tightness, shortness of breath and wheezing.   Cardiovascular: Positive for palpitations. Negative for chest pain and leg swelling.  Gastrointestinal: Negative for abdominal distention, abdominal pain, blood in stool, constipation, diarrhea, nausea and vomiting.  Genitourinary: Negative for difficulty urinating and hematuria.  Musculoskeletal: Negative for arthralgias, myalgias and neck pain.  Skin: Negative for rash.  Neurological: Negative for dizziness, seizures, syncope and headaches.  Hematological: Negative for adenopathy. Does not bruise/bleed easily.  Psychiatric/Behavioral: Positive for dysphoric mood. The patient is nervous/anxious.    Objective:    BP 130/76 (BP Location: Left Arm, Patient Position: Sitting, Cuff Size: Normal)   Pulse 75   Temp 98.2 F (36.8 C) (Oral)   Ht '4\' 10"'  (1.473 m)   Wt 167 lb 8 oz (76 kg)   SpO2 97%   BMI 35.01 kg/m   Wt Readings from Last 3 Encounters:  02/10/19 167 lb 8 oz (76 kg)  08/08/18 161 lb 8 oz (73.3 kg)  05/30/18 166 lb (75.3 kg)    Physical Exam Vitals signs and nursing note reviewed.  Constitutional:      General: She is not in acute distress.    Appearance: Normal appearance. She is well-developed.  HENT:     Head: Normocephalic and atraumatic.     Right Ear: Hearing, tympanic membrane, ear canal and external ear normal.     Left Ear: Hearing, tympanic membrane, ear canal and external ear normal.     Nose: Nose normal.     Mouth/Throat:     Mouth: Mucous membranes are moist.     Pharynx: Uvula midline. No oropharyngeal exudate or posterior oropharyngeal erythema.  Eyes:     General: No scleral icterus.    Conjunctiva/sclera: Conjunctivae normal.     Pupils: Pupils are equal, round, and  reactive to light.  Neck:     Musculoskeletal: Normal range of motion and neck supple.  Cardiovascular:     Rate and Rhythm: Normal rate and regular rhythm.     Pulses: Normal pulses.          Radial pulses are 2+ on the right side and 2+ on the left side.     Heart sounds: Normal heart sounds. No murmur.  Pulmonary:     Effort: Pulmonary effort is normal. No respiratory distress.     Breath sounds: Normal breath sounds. No wheezing, rhonchi or rales.  Abdominal:     General: Bowel sounds are normal. There is no distension.     Palpations: Abdomen is soft. There is no mass.     Tenderness: There  is no abdominal tenderness. There is no guarding or rebound.  Musculoskeletal: Normal range of motion.  Lymphadenopathy:     Cervical: No cervical adenopathy.  Skin:    General: Skin is warm and dry.     Findings: No rash.  Neurological:     Mental Status: She is alert and oriented to person, place, and time.     Comments: CN grossly intact, station and gait intact  Psychiatric:        Behavior: Behavior normal.        Thought Content: Thought content normal.        Judgment: Judgment normal.       Results for orders placed or performed in visit on 12/09/18  HM MAMMOGRAPHY  Result Value Ref Range   HM Mammogram 0-4 Bi-Rad 0-4 Bi-Rad, Self Reported Normal   Depression screen [facility] 2/9 02/10/2019 02/07/2018  Decreased Interest 1 0  Down, Depressed, Hopeless 1 0  PHQ - 2 Score 2 0  Altered sleeping 0 -  Tired, decreased energy 2 -  Change in appetite 1 -  Feeling bad or failure about yourself  0 -  Trouble concentrating 0 -  Moving slowly or fidgety/restless 0 -  Suicidal thoughts 0 -  PHQ-9 Score 5 -    GAD 7 : Generalized Anxiety Score 02/10/2019  Nervous, Anxious, on Edge 2  Control/stop worrying 1  Worry too much - different things 2  Trouble relaxing 3  Restless 1  Easily annoyed or irritable 1  Afraid - awful might happen 0  Total GAD 7 Score 10     Assessment & Plan:    Problem List Items Addressed This Visit    Vitamin D deficiency    Update levels. Continue vit D 1000 IU daily.       Relevant Orders   VITAMIN D 25 Hydroxy (Vit-D Deficiency, Fractures)   Routine general medical examination at a health care facility - Primary    Preventative protocols reviewed and updated unless pt declined. Discussed healthy diet and lifestyle.       Relevant Orders   Lipid panel   Basic metabolic panel   Hemoglobin A1c   VITAMIN D 25 Hydroxy (Vit-D Deficiency, Fractures)   Obesity, Class I, BMI 30-34.9    Encouraged healthy diet and lifestyle changes to affect sustainable weight loss.       Relevant Orders   Lipid panel   Basic metabolic panel   Hemoglobin A1c   VITAMIN D 25 Hydroxy (Vit-D Deficiency, Fractures)   Hypertension    Chronic, stable. Continue current regimen.       Relevant Orders   Basic metabolic panel   Dyslipidemia    Chronic, off meds. Update FLP. The 10-year ASCVD risk score Mikey Bussing DC Brooke Bonito., et al., 2013) is: 14.4%*   Values used to calculate the score:     Age: 59 years     Sex: Female     Is Non-Hispanic African American: No     Diabetic: Yes     Tobacco smoker: No     Systolic Blood Pressure: 412 mmHg     Is BP treated: Yes     HDL Cholesterol: 29 mg/dL*     Total Cholesterol: 166 mg/dL*     * - Cholesterol units were assumed for this score calculation       Relevant Orders   Lipid panel   Diabetes mellitus type 2, controlled (HCC)    Chronic, stable. Continue metformin. Update A1c today.  Relevant Orders   Lipid panel   Basic metabolic panel   Hemoglobin A1c   Caregiver stress    Support again provided. Discussed importance of preventing caregiver burnout. Recommended she plan vacation for this year. Will Rx wellbutrin to trial, discussed possible side effects. No seizure hx.       Advanced care planning/counseling discussion    Advanced directive discussion - has not set up. Discussed. HCPOA would be  local daughter. Advanced directive packet provided today.           Meds ordered this encounter  Medications  . buPROPion (WELLBUTRIN SR) 100 MG 12 hr tablet    Sig: Take 1 tablet (100 mg total) by mouth daily.    Dispense:  30 tablet    Refill:  6   Orders Placed This Encounter  Procedures  . Lipid panel  . Basic metabolic panel  . Hemoglobin A1c  . VITAMIN D 25 Hydroxy (Vit-D Deficiency, Fractures)    Follow up plan: Return in about 6 months (around 08/11/2019) for follow up visit.  Ria Bush, MD

## 2019-02-10 NOTE — Assessment & Plan Note (Signed)
Chronic, off meds. Update FLP. The 10-year ASCVD risk score Mikey Bussing DC Brooke Bonito., et al., 2013) is: 14.4%*   Values used to calculate the score:     Age: 63 years     Sex: Female     Is Non-Hispanic African American: No     Diabetic: Yes     Tobacco smoker: No     Systolic Blood Pressure: 599 mmHg     Is BP treated: Yes     HDL Cholesterol: 29 mg/dL*     Total Cholesterol: 166 mg/dL*     * - Cholesterol units were assumed for this score calculation

## 2019-02-10 NOTE — Assessment & Plan Note (Signed)
Encouraged healthy diet and lifestyle changes to affect sustainable weight loss.  

## 2019-02-10 NOTE — Assessment & Plan Note (Signed)
Chronic, stable. Continue current regimen. 

## 2019-02-10 NOTE — Assessment & Plan Note (Signed)
Preventative protocols reviewed and updated unless pt declined. Discussed healthy diet and lifestyle.  

## 2019-02-11 LAB — LIPID PANEL
Chol/HDL Ratio: 6.6 ratio — ABNORMAL HIGH (ref 0.0–4.4)
Cholesterol, Total: 171 mg/dL (ref 100–199)
HDL: 26 mg/dL — ABNORMAL LOW (ref >39)
LDL Calculated: 107 mg/dL — ABNORMAL HIGH (ref 0–99)
Triglycerides: 191 mg/dL — ABNORMAL HIGH (ref 0–149)
VLDL Cholesterol Cal: 38 mg/dL (ref 5–40)

## 2019-02-11 LAB — BASIC METABOLIC PANEL
BUN/Creatinine Ratio: 17 (ref 12–28)
BUN: 13 mg/dL (ref 8–27)
CO2: 25 mmol/L (ref 20–29)
CREATININE: 0.75 mg/dL (ref 0.57–1.00)
Calcium: 9.6 mg/dL (ref 8.7–10.3)
Chloride: 100 mmol/L (ref 96–106)
GFR calc Af Amer: 98 mL/min/{1.73_m2} (ref >59)
GFR calc non Af Amer: 85 mL/min/{1.73_m2} (ref >59)
GLUCOSE: 129 mg/dL — AB (ref 65–99)
Potassium: 4 mmol/L (ref 3.5–5.2)
Sodium: 142 mmol/L (ref 134–144)

## 2019-02-11 LAB — VITAMIN D 25 HYDROXY (VIT D DEFICIENCY, FRACTURES): Vit D, 25-Hydroxy: 34.9 ng/mL (ref 30.0–100.0)

## 2019-02-11 LAB — HEMOGLOBIN A1C
Est. average glucose Bld gHb Est-mCnc: 157 mg/dL
Hgb A1c MFr Bld: 7.1 % — ABNORMAL HIGH (ref 4.8–5.6)

## 2019-02-22 ENCOUNTER — Other Ambulatory Visit: Payer: Self-pay | Admitting: Family Medicine

## 2019-03-04 DIAGNOSIS — M47816 Spondylosis without myelopathy or radiculopathy, lumbar region: Secondary | ICD-10-CM | POA: Diagnosis not present

## 2019-03-04 DIAGNOSIS — M4127 Other idiopathic scoliosis, lumbosacral region: Secondary | ICD-10-CM | POA: Diagnosis not present

## 2019-03-05 ENCOUNTER — Encounter: Payer: Self-pay | Admitting: Family Medicine

## 2019-03-05 DIAGNOSIS — M47816 Spondylosis without myelopathy or radiculopathy, lumbar region: Secondary | ICD-10-CM | POA: Insufficient documentation

## 2019-03-05 DIAGNOSIS — M419 Scoliosis, unspecified: Secondary | ICD-10-CM | POA: Insufficient documentation

## 2019-03-17 ENCOUNTER — Other Ambulatory Visit: Payer: Self-pay | Admitting: Family Medicine

## 2019-03-25 ENCOUNTER — Telehealth: Payer: Self-pay | Admitting: *Deleted

## 2019-03-25 MED ORDER — BUPROPION HCL ER (SR) 100 MG PO TB12: 100.0000 mg | ORAL_TABLET | Freq: Every day | ORAL | 1 refills | Status: DC

## 2019-03-25 NOTE — Telephone Encounter (Signed)
Patient left a voicemail stating that [facility] has sent over a refill request on her Bupropion. Patient stated that they have not gotten the script back from the office. Patient stated that she will be out the medication the end of April and requested that this be sent in. Patient stated that she does not know why the script was not filled?

## 2019-03-25 NOTE — Telephone Encounter (Signed)
Sent rx to [facility]

## 2019-05-14 ENCOUNTER — Ambulatory Visit: Payer: 59 | Admitting: Family Medicine

## 2019-05-14 ENCOUNTER — Other Ambulatory Visit: Payer: Self-pay

## 2019-05-14 ENCOUNTER — Encounter: Payer: Self-pay | Admitting: Family Medicine

## 2019-05-14 VITALS — BP 120/66 | HR 107 | Temp 98.3°F | Ht <= 58 in | Wt 166.1 lb

## 2019-05-14 DIAGNOSIS — T300 Burn of unspecified body region, unspecified degree: Secondary | ICD-10-CM

## 2019-05-14 DIAGNOSIS — D225 Melanocytic nevi of trunk: Secondary | ICD-10-CM | POA: Diagnosis not present
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
## 2019-05-14 NOTE — Assessment & Plan Note (Signed)
Darker nevus than other moles. Measured today, she will monitor. Discussed derm eval.
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
## 2019-06-15 ENCOUNTER — Telehealth: Payer: Self-pay | Admitting: Family Medicine

## 2019-06-15 NOTE — Telephone Encounter (Signed)
Unable to reach patient. L/m to call back

## 2019-06-15 NOTE — Telephone Encounter (Signed)
Please call pt to set up 1st Shingrix vaccine. Tues 7/14, 7/21, 7/28, 8/4 and 8/11 are preferred. Slots are [id number], 230, 245, 300, 315 and 330.

## 2019-06-15 NOTE — Telephone Encounter (Signed)
Spoke with pt she declined scheduling appointment  She will call back to schedule

## 2019-07-21 ENCOUNTER — Other Ambulatory Visit: Payer: Self-pay | Admitting: Family Medicine

## 2019-08-02 ENCOUNTER — Other Ambulatory Visit: Payer: Self-pay | Admitting: Family Medicine

## 2019-08-03 ENCOUNTER — Other Ambulatory Visit: Payer: Self-pay

## 2019-08-03 ENCOUNTER — Ambulatory Visit: Payer: 59 | Admitting: Family Medicine

## 2019-08-03 ENCOUNTER — Encounter: Payer: Self-pay | Admitting: Family Medicine

## 2019-08-03 VITALS — BP 120/80 | HR 88 | Temp 97.6°F | Ht <= 58 in | Wt 167.2 lb

## 2019-08-03 DIAGNOSIS — R1030 Lower abdominal pain, unspecified: Secondary | ICD-10-CM

## 2019-08-03 DIAGNOSIS — Z636 Dependent relative needing care at home: Secondary | ICD-10-CM | POA: Diagnosis not present

## 2019-08-03 DIAGNOSIS — E119 Type 2 diabetes mellitus without complications: Secondary | ICD-10-CM

## 2019-08-03 LAB — POCT GLYCOSYLATED HEMOGLOBIN (HGB A1C): Hemoglobin A1C: 6.7 % — AB (ref 4.0–5.6)

## 2019-08-03 NOTE — Assessment & Plan Note (Signed)
Of unclear cause. Largely benign abdominal exam today.

## 2019-08-03 NOTE — Progress Notes (Signed)
This visit was conducted in person.  BP 120/80 (BP Location: Left Arm, Patient Position: Sitting, Cuff Size: Normal)   Pulse 88   Temp 97.6 F (36.4 C) (Temporal)   Ht _0  (1.473 m)   Wt 167 lb 4 oz (75.9 kg)   SpO2 98%   BMI 34.96 kg/m    CC: 6 mo f/u visit Subjective:    Patient ID: Olivia Benton, female    DOB: 03/01/1956, 63 y.o.   MRN: [id number]  HPI: Olivia Benton is a 63 y.o. female presenting on 08/03/2019 for Follow-up (Here for 6 mo f/u.)   1d h/o intermittent RLQ sharp discomfort. No bowel changes. No pain currently. No heartburn. Improves on its own. No fevers, appetite changes.   DM - does not regularly check sugars. Compliant with antihyperglycemic regimen which includes: metformin 56m daily. Denies low sugars or hypoglycemic symptoms. Denies paresthesias. Last diabetic eye exam 12/2018. Pneumovax: 08/2017. Prevnar: not due. Glucometer brand: onetouch. DSME: completed remotely. Lab Results  Component Value Date   HGBA1C 6.7 (A) 08/03/2019   Diabetic Foot Exam - Simple   Simple Foot Form Diabetic Foot exam was performed with the following findings: Yes 08/03/2019  2:39 PM  Visual Inspection No deformities, no ulcerations, no other skin breakdown bilaterally: Yes Sensation Testing Intact to touch and monofilament testing bilaterally: Yes Pulse Check See comments: Yes Comments Diminished pulses BLE    Lab Results  Component Value Date   MICROALBUR <0.7 07/25/2015     MDD - on wellbutrin 105mdaily. Feels she is doing well on this regimen.      Relevant past medical, surgical, family and social history reviewed and updated as indicated. Interim medical history since our last visit reviewed. Allergies and medications reviewed and updated. Outpatient Medications Prior to Visit  Medication Sig Dispense Refill  . Blood Glucose Monitoring Suppl (ONE TOUCH ULTRA MINI) w/Device KIT Use to check sugar once daily. Dx: E11.9 100 each 3  . buPROPion  (WELLBUTRIN SR) 100 MG 12 hr tablet TAKE 1 TABLET BY MOUTH  DAILY 90 tablet 1  . carisoprodol (SOMA) 350 MG tablet QHS PRN - reported on 05/24/2016  0  . cholecalciferol (VITAMIN D) 1000 UNITS tablet Take 1,000 Units by mouth daily.    . Marland Kitchenesonide (DESOWEN) 0.05 % cream apply to affected area twice a day 30 g 0  . fluticasone (FLONASE) 50 MCG/ACT nasal spray USE 2 SPRAYS IN EACH  NOSTRIL DAILY 48 g 1  . glucose blood (ONE TOUCH ULTRA TEST) test strip Use to check sugar once daily. Dx: E11.9 100 each 3  . hydrochlorothiazide (MICROZIDE) 12.5 MG capsule TAKE 1 CAPSULE BY MOUTH  DAILY 90 capsule 1  . Hydrocodone-Acetaminophen 5-300 MG TABS as needed.   0  . levocetirizine (XYZAL) 5 MG tablet Take 5 mg by mouth every evening.    . metFORMIN (GLUCOPHAGE) 500 MG tablet TAKE 1 TABLET BY MOUTH  DAILY 90 tablet 1  . montelukast (SINGULAIR) 10 MG tablet TAKE 1 TABLET BY MOUTH AT  BEDTIME 90 tablet 3  . Multiple Minerals-Vitamins (CALCIUM & VIT D3 BONE HEALTH PO) Take by mouth daily.    . Marland Kitchenmeprazole (PRILOSEC) 40 MG capsule TAKE 1 CAPSULE BY MOUTH  DAILY 90 capsule 3  . ONETOUCH DELICA LANCETS 3322WISC Use to check sugar once daily. Dx: E11.9 100 each 3  . tretinoin (RETIN-A) 0.05 % cream Apply 1 application topically at bedtime.    . valsartan (DIOVAN) 160 MG  tablet TAKE 1 TABLET BY MOUTH  DAILY 90 tablet 1  . vitamin C (ASCORBIC ACID) 500 MG tablet Take 500 mg by mouth daily.     No facility-administered medications prior to visit.      Per HPI unless specifically indicated in ROS section below Review of Systems Objective:    BP 120/80 (BP Location: Left Arm, Patient Position: Sitting, Cuff Size: Normal)   Pulse 88   Temp 97.6 F (36.4 C) (Temporal)   Ht _0  (1.473 m)   Wt 167 lb 4 oz (75.9 kg)   SpO2 98%   BMI 34.96 kg/m   Wt Readings from Last 3 Encounters:  08/03/19 167 lb 4 oz (75.9 kg)  05/14/19 166 lb 1 oz (75.3 kg)  02/10/19 167 lb 8 oz (76 kg)    Physical Exam Vitals signs and  nursing note reviewed.  Constitutional:      General: She is not in acute distress.    Appearance: Normal appearance. She is well-developed. She is not ill-appearing.  HENT:     Head: Normocephalic and atraumatic.     Right Ear: External ear normal.     Left Ear: External ear normal.     Mouth/Throat:     Mouth: Mucous membranes are moist.     Pharynx: No oropharyngeal exudate or posterior oropharyngeal erythema.  Eyes:     General: No scleral icterus.    Extraocular Movements: Extraocular movements intact.     Conjunctiva/sclera: Conjunctivae normal.     Pupils: Pupils are equal, round, and reactive to light.  Neck:     Musculoskeletal: Normal range of motion and neck supple.  Cardiovascular:     Rate and Rhythm: Normal rate and regular rhythm.     Pulses: Normal pulses.     Heart sounds: Normal heart sounds. No murmur.  Pulmonary:     Effort: Pulmonary effort is normal. No respiratory distress.     Breath sounds: Normal breath sounds. No wheezing, rhonchi or rales.  Abdominal:     General: Abdomen is flat. Bowel sounds are normal. There is no distension.     Palpations: Abdomen is soft. There is no mass.     Tenderness: There is abdominal tenderness (minimal) in the right lower quadrant. There is no right CVA tenderness, left CVA tenderness, guarding or rebound.     Hernia: No hernia is present.  Musculoskeletal:     Comments: See HPI for foot exam if done  Lymphadenopathy:     Cervical: No cervical adenopathy.  Skin:    General: Skin is warm and dry.     Findings: No rash.  Neurological:     Mental Status: She is alert.  Psychiatric:        Mood and Affect: Mood normal.        Behavior: Behavior normal.       Results for orders placed or performed in visit on 08/03/19  POCT glycosylated hemoglobin (Hb A1C)  Result Value Ref Range   Hemoglobin A1C 6.7 (A) 4.0 - 5.6 %   HbA1c POC (<> result, manual entry)     HbA1c, POC (prediabetic range)     HbA1c, POC  (controlled diabetic range)    HM DIABETES EYE EXAM  Result Value Ref Range   HM Diabetic Eye Exam No Retinopathy No Retinopathy   Assessment & Plan:   Problem List Items Addressed This Visit    Lower abdominal pain    Of unclear cause. Largely benign abdominal exam  today.       Diabetes mellitus type 2, controlled ([place]) - Primary    Chronic, stable. Continue metformin. UTD eye exam. Foot exam today.       Relevant Orders   POCT glycosylated hemoglobin (Hb A1C) (Completed)   Caregiver stress    Continue once daily wellbutrin - doing well on this regimen.           No orders of the defined types were placed in this encounter.  Orders Placed This Encounter  Procedures  . POCT glycosylated hemoglobin (Hb A1C)  . HM DIABETES EYE EXAM    This external order was created through the Results Console.    Follow up plan: Return in about 6 months (around 02/03/2020) for annual exam, prior fasting for blood work.  Ria Bush, MD

## 2019-08-03 NOTE — Patient Instructions (Addendum)
You are doing well today Work on low sugar low carb diabetic diet.  Work on regular exercise routine.  Continue current medicines.  Return as needed or in 6 months for physical.

## 2019-08-03 NOTE — Assessment & Plan Note (Signed)
Continue once daily wellbutrin - doing well on this regimen.

## 2019-08-03 NOTE — Assessment & Plan Note (Addendum)
Chronic, stable. Continue metformin. UTD eye exam. Foot exam today.

## 2019-10-02 ENCOUNTER — Other Ambulatory Visit: Payer: Self-pay | Admitting: Family Medicine

## 2019-10-20 ENCOUNTER — Other Ambulatory Visit: Payer: Self-pay

## 2019-10-20 ENCOUNTER — Other Ambulatory Visit: Payer: Self-pay | Admitting: Family Medicine

## 2019-10-20 DIAGNOSIS — Z1231 Encounter for screening mammogram for malignant neoplasm of breast: Secondary | ICD-10-CM

## 2019-12-14 ENCOUNTER — Ambulatory Visit
Admission: RE | Admit: 2019-12-14 | Discharge: 2019-12-14 | Disposition: A | Discharge status: Home / Self Care | Payer: 59 | Source: Ambulatory Visit | Attending: Family Medicine | Admitting: Family Medicine

## 2019-12-14 DIAGNOSIS — Z1231 Encounter for screening mammogram for malignant neoplasm of breast: Secondary | ICD-10-CM | POA: Diagnosis not present

## 2019-12-14 LAB — HM MAMMOGRAPHY

## 2019-12-15 ENCOUNTER — Encounter: Payer: Self-pay | Admitting: Family Medicine

## 2019-12-30 ENCOUNTER — Other Ambulatory Visit: Payer: Self-pay | Admitting: Family Medicine

## 2020-01-07 ENCOUNTER — Other Ambulatory Visit: Payer: Self-pay | Admitting: Family Medicine

## 2020-01-07 NOTE — Telephone Encounter (Signed)
Pt left a voicemail requesting a refill of Desonide 0.05% Cream for a neck rash.  Pt stated that this has not been prescribed in a while but she is needing a refill of this d/t a new rash she believed to get from wearing a mask that was not latex-free.

## 2020-01-08 MED ORDER — DESONIDE 0.05 % EX CREA: TOPICAL_CREAM | CUTANEOUS | 0 refills | Status: DC

## 2020-01-08 NOTE — Telephone Encounter (Addendum)
Med sent in to [facility].

## 2020-01-08 NOTE — Addendum Note (Signed)
Addended by: Ria Bush on: 01/08/2020 07:16 AM   Modules accepted: Orders

## 2020-01-08 NOTE — Telephone Encounter (Signed)
Left message on vm per dpr notifying pt rx was sent.

## 2020-02-19 ENCOUNTER — Other Ambulatory Visit: Payer: Self-pay | Admitting: Family Medicine

## 2020-02-19 ENCOUNTER — Other Ambulatory Visit: Payer: 59

## 2020-02-19 DIAGNOSIS — E119 Type 2 diabetes mellitus without complications: Secondary | ICD-10-CM

## 2020-02-19 DIAGNOSIS — E559 Vitamin D deficiency, unspecified: Secondary | ICD-10-CM

## 2020-02-19 DIAGNOSIS — E785 Hyperlipidemia, unspecified: Secondary | ICD-10-CM

## 2020-02-23 ENCOUNTER — Encounter: Payer: 59 | Admitting: Family Medicine

## 2020-02-23 ENCOUNTER — Other Ambulatory Visit: Payer: Self-pay | Admitting: Family Medicine

## 2020-02-24 ENCOUNTER — Other Ambulatory Visit (INDEPENDENT_AMBULATORY_CARE_PROVIDER_SITE_OTHER): Payer: 59

## 2020-02-24 ENCOUNTER — Other Ambulatory Visit: Payer: Self-pay

## 2020-02-24 DIAGNOSIS — E559 Vitamin D deficiency, unspecified: Secondary | ICD-10-CM | POA: Diagnosis not present

## 2020-02-24 DIAGNOSIS — E785 Hyperlipidemia, unspecified: Secondary | ICD-10-CM

## 2020-02-24 DIAGNOSIS — E119 Type 2 diabetes mellitus without complications: Secondary | ICD-10-CM

## 2020-02-24 NOTE — Addendum Note (Signed)
Addended by: Ellamae Sia on: 02/24/2020 07:40 AM   Modules accepted: Orders

## 2020-02-25 LAB — LIPID PANEL
Chol/HDL Ratio: 5.7 ratio — ABNORMAL HIGH (ref 0.0–4.4)
Cholesterol, Total: 142 mg/dL (ref 100–199)
HDL: 25 mg/dL — ABNORMAL LOW (ref >39)
LDL Chol Calc (NIH): 90 mg/dL (ref 0–99)
Triglycerides: 150 mg/dL — ABNORMAL HIGH (ref 0–149)
VLDL Cholesterol Cal: 27 mg/dL (ref 5–40)

## 2020-02-25 LAB — COMPREHENSIVE METABOLIC PANEL
ALT: 31 IU/L (ref 0–32)
AST: 22 IU/L (ref 0–40)
Albumin/Globulin Ratio: 1.6 (ref 1.2–2.2)
Albumin: 4.2 g/dL (ref 3.8–4.8)
Alkaline Phosphatase: 67 IU/L (ref 39–117)
BUN/Creatinine Ratio: 18 (ref 12–28)
BUN: 14 mg/dL (ref 8–27)
Bilirubin Total: 0.4 mg/dL (ref 0.0–1.2)
CO2: 25 mmol/L (ref 20–29)
Calcium: 9.1 mg/dL (ref 8.7–10.3)
Chloride: 103 mmol/L (ref 96–106)
Creatinine, Ser: 0.76 mg/dL (ref 0.57–1.00)
GFR calc Af Amer: 96 mL/min/{1.73_m2} (ref >59)
GFR calc non Af Amer: 83 mL/min/{1.73_m2} (ref >59)
Globulin, Total: 2.7 g/dL (ref 1.5–4.5)
Glucose: 129 mg/dL — ABNORMAL HIGH (ref 65–99)
Potassium: 4 mmol/L (ref 3.5–5.2)
Sodium: 144 mmol/L (ref 134–144)
Total Protein: 6.9 g/dL (ref 6.0–8.5)

## 2020-02-25 LAB — VITAMIN D 25 HYDROXY (VIT D DEFICIENCY, FRACTURES): Vit D, 25-Hydroxy: 31.4 ng/mL (ref 30.0–100.0)

## 2020-02-25 LAB — HEMOGLOBIN A1C
Est. average glucose Bld gHb Est-mCnc: 151 mg/dL
Hgb A1c MFr Bld: 6.9 % — ABNORMAL HIGH (ref 4.8–5.6)

## 2020-03-01 ENCOUNTER — Other Ambulatory Visit: Payer: Self-pay

## 2020-03-01 ENCOUNTER — Encounter: Payer: Self-pay | Admitting: Family Medicine

## 2020-03-01 ENCOUNTER — Ambulatory Visit (INDEPENDENT_AMBULATORY_CARE_PROVIDER_SITE_OTHER): Payer: 59 | Admitting: Family Medicine

## 2020-03-01 VITALS — BP 140/80 | HR 90 | Temp 97.8°F | Ht <= 58 in | Wt 163.4 lb

## 2020-03-01 DIAGNOSIS — E1169 Type 2 diabetes mellitus with other specified complication: Secondary | ICD-10-CM

## 2020-03-01 DIAGNOSIS — Z7189 Other specified counseling: Secondary | ICD-10-CM

## 2020-03-01 DIAGNOSIS — G720 Drug-induced myopathy: Secondary | ICD-10-CM | POA: Insufficient documentation

## 2020-03-01 DIAGNOSIS — M545 Low back pain, unspecified: Secondary | ICD-10-CM

## 2020-03-01 DIAGNOSIS — Z789 Other specified health status: Secondary | ICD-10-CM

## 2020-03-01 DIAGNOSIS — G2581 Restless legs syndrome: Secondary | ICD-10-CM | POA: Diagnosis not present

## 2020-03-01 DIAGNOSIS — E559 Vitamin D deficiency, unspecified: Secondary | ICD-10-CM

## 2020-03-01 DIAGNOSIS — E66811 Obesity, class 1: Secondary | ICD-10-CM

## 2020-03-01 DIAGNOSIS — G8929 Other chronic pain: Secondary | ICD-10-CM

## 2020-03-01 DIAGNOSIS — E785 Hyperlipidemia, unspecified: Secondary | ICD-10-CM

## 2020-03-01 DIAGNOSIS — M419 Scoliosis, unspecified: Secondary | ICD-10-CM

## 2020-03-01 DIAGNOSIS — E669 Obesity, unspecified: Secondary | ICD-10-CM

## 2020-03-01 DIAGNOSIS — Z636 Dependent relative needing care at home: Secondary | ICD-10-CM

## 2020-03-01 DIAGNOSIS — Z0000 Encounter for general adult medical examination without abnormal findings: Secondary | ICD-10-CM | POA: Diagnosis not present

## 2020-03-01 DIAGNOSIS — I10 Essential (primary) hypertension: Secondary | ICD-10-CM

## 2020-03-01 MED ORDER — TIZANIDINE HCL 2 MG PO TABS: 2.0000 mg | ORAL_TABLET | Freq: Two times a day (BID) | ORAL | 0 refills | Status: DC | PRN

## 2020-03-01 NOTE — Assessment & Plan Note (Signed)
Continue daily replacement 

## 2020-03-01 NOTE — Assessment & Plan Note (Addendum)
Preventative protocols reviewed and updated unless pt declined. Discussed healthy diet and lifestyle.  Reviewed COVID vaccines - encouraged she take one.

## 2020-03-01 NOTE — Patient Instructions (Addendum)
Iron levels checked today for possible restless legs.  Consider lung cancer screening.  We will order bone density scan at that time.  I recommend the covid vaccine when you can get it.  Look at www.[place].org if you want drive through location  Work on advanced directive packet.  Schedule welcome to medicare visit later this year.  Good to see you. Watch sugar in diet.  Try tizanidine 2mg  for muscle spasm at night.   Health Maintenance for Postmenopausal Women Menopause is a normal process in which your ability to get pregnant comes to an end. This process happens slowly over many months or years, usually between the ages of 41 and 13. Menopause is complete when you have missed your menstrual periods for 12 months. It is important to talk with your health care provider about some of the most common conditions that affect women after menopause (postmenopausal women). These include heart disease, cancer, and bone loss (osteoporosis). Adopting a healthy lifestyle and getting preventive care can help to promote your health and wellness. The actions you take can also lower your chances of developing some of these common conditions. What should I know about menopause? During menopause, you may get a number of symptoms, such as:  Hot flashes. These can be moderate or severe.  Night sweats.  Decrease in sex drive.  Mood swings.  Headaches.  Tiredness.  Irritability.  Memory problems.  Insomnia. Choosing to treat or not to treat these symptoms is a decision that you make with your health care provider. Do I need hormone replacement therapy?  Hormone replacement therapy is effective in treating symptoms that are caused by menopause, such as hot flashes and night sweats.  Hormone replacement carries certain risks, especially as you become older. If you are thinking about using estrogen or estrogen with progestin, discuss the benefits and risks with your health care provider. What is  my risk for heart disease and stroke? The risk of heart disease, heart attack, and stroke increases as you age. One of the causes may be a change in the body's hormones during menopause. This can affect how your body uses dietary fats, triglycerides, and cholesterol. Heart attack and stroke are medical emergencies. There are many things that you can do to help prevent heart disease and stroke. Watch your blood pressure  High blood pressure causes heart disease and increases the risk of stroke. This is more likely to develop in people who have high blood pressure readings, are of African descent, or are overweight.  Have your blood pressure checked: ? Every 3-5 years if you are 65-74 years of age. ? Every year if you are 74 years old or older. Eat a healthy diet   Eat a diet that includes plenty of vegetables, fruits, low-fat dairy products, and lean protein.  Do not eat a lot of foods that are high in solid fats, added sugars, or sodium. Get regular exercise Get regular exercise. This is one of the most important things you can do for your health. Most adults should:  Try to exercise for at least 150 minutes each week. The exercise should increase your heart rate and make you sweat (moderate-intensity exercise).  Try to do strengthening exercises at least twice each week. Do these in addition to the moderate-intensity exercise.  Spend less time sitting. Even light physical activity can be beneficial. Other tips  Work with your health care provider to achieve or maintain a healthy weight.  Do not use any products that contain  nicotine or tobacco, such as cigarettes, e-cigarettes, and chewing tobacco. If you need help quitting, ask your health care provider.  Know your numbers. Ask your health care provider to check your cholesterol and your blood sugar (glucose). Continue to have your blood tested as directed by your health care provider. Do I need screening for cancer? Depending on  your health history and family history, you may need to have cancer screening at different stages of your life. This may include screening for:  Breast cancer.  Cervical cancer.  Lung cancer.  Colorectal cancer. What is my risk for osteoporosis? After menopause, you may be at increased risk for osteoporosis. Osteoporosis is a condition in which bone destruction happens more quickly than new bone creation. To help prevent osteoporosis or the bone fractures that can happen because of osteoporosis, you may take the following actions:  If you are 54-3 years old, get at least 1,000 mg of calcium and at least 600 mg of vitamin D per day.  If you are older than age 38 but younger than age 63, get at least 1,200 mg of calcium and at least 600 mg of vitamin D per day.  If you are older than age 24, get at least 1,200 mg of calcium and at least 800 mg of vitamin D per day. Smoking and drinking excessive alcohol increase the risk of osteoporosis. Eat foods that are rich in calcium and vitamin D, and do weight-bearing exercises several times each week as directed by your health care provider. How does menopause affect my mental health? Depression may occur at any age, but it is more common as you become older. Common symptoms of depression include:  Low or sad mood.  Changes in sleep patterns.  Changes in appetite or eating patterns.  Feeling an overall lack of motivation or enjoyment of activities that you previously enjoyed.  Frequent crying spells. Talk with your health care provider if you think that you are experiencing depression. General instructions See your health care provider for regular wellness exams and vaccines. This may include:  Scheduling regular health, dental, and eye exams.  Getting and maintaining your vaccines. These include: ? Influenza vaccine. Get this vaccine each year before the flu season begins. ? Pneumonia vaccine. ? Shingles vaccine. ? Tetanus, diphtheria,  and pertussis (Tdap) booster vaccine. Your health care provider may also recommend other immunizations. Tell your health care provider if you have ever been abused or do not feel safe at home. Summary  Menopause is a normal process in which your ability to get pregnant comes to an end.  This condition causes hot flashes, night sweats, decreased interest in sex, mood swings, headaches, or lack of sleep.  Treatment for this condition may include hormone replacement therapy.  Take actions to keep yourself healthy, including exercising regularly, eating a healthy diet, watching your weight, and checking your blood pressure and blood sugar levels.  Get screened for cancer and depression. Make sure that you are up to date with all your vaccines. This information is not intended to replace advice given to you by your health care provider. Make sure you discuss any questions you have with your health care provider. Document Revised: 11/26/2018 Document Reviewed: 11/26/2018 Elsevier Patient Education  2020 [employer].

## 2020-03-01 NOTE — Assessment & Plan Note (Signed)
Newly noted - will check iron levels. Consider requip in place of soma.

## 2020-03-01 NOTE — Assessment & Plan Note (Signed)
Longstanding. Will trial zanaflex in place of soma.  Previously on soma and hydrocodone through neurosurgeon who retired

## 2020-03-01 NOTE — Assessment & Plan Note (Signed)
Advanced directive discussion - has not set up. Discussed. HCPOA would be local daughter. Advanced directive packet provided today.

## 2020-03-01 NOTE — Assessment & Plan Note (Signed)
Chronic, stable. Continue current regimen. 

## 2020-03-01 NOTE — Assessment & Plan Note (Addendum)
Has not tolerated statins - consider weekly low potency statin like lovastatin. Encouraged increased aerobic exercise to improve HDL.  The 10-year ASCVD risk score Olivia Benton., et al., 2013) is: 17.5%   Values used to calculate the score:     Age: 64 years     Sex: Female     Is Non-Hispanic African American: No     Diabetic: Yes     Tobacco smoker: No     Systolic Blood Pressure: [id number] mmHg     Is BP treated: Yes     HDL Cholesterol: 25 mg/dL     Total Cholesterol: 142 mg/dL

## 2020-03-01 NOTE — Assessment & Plan Note (Signed)
Stable period on wellbutrin daily. Desires to continue.

## 2020-03-01 NOTE — Assessment & Plan Note (Signed)
Chronic, well controlled on metformin. Continue.

## 2020-03-01 NOTE — Assessment & Plan Note (Signed)
Encouraged healthy diet and lifestyle to affect sustainable weight loss.  

## 2020-03-01 NOTE — Progress Notes (Addendum)
 This visit was conducted in person.  BP 140/80 (BP Location: Left Arm, Patient Position: Sitting, Cuff Size: Normal)   Pulse 90   Temp 97.8 F (36.6 C) (Temporal)   Ht 4' 9.75" (1.467 m)   Wt 163 lb 7 oz (74.1 kg)   SpO2 97%   BMI 34.45 kg/m    CC: CPE Subjective:    Patient ID: Olivia Benton, female    DOB: 12/07/1956, 63 y.o.   MRN: [id number]  HPI: Olivia Benton is a 63 y.o. female presenting on 03/01/2020 for Annual Exam (Wants to discuss Soma. )   Medicare starts 03/17/2020.   Worried medicare may not cover soma. This was previously prescribed by neurosurgeon. Uses this for longstanding nocturnal leg cramps and notes restless leg symptoms which started about a month ago.   Preventative: Colon cancer screening -pt states h/o normal colonoscopy with diverticulosis 2014 by Dr Smith surgery, no records available. rec rpt 10 yrs. Told had diverticulosis.  Breast cancer screening -12/2020WNL. She does breast exams at home. Well woman exam -pap WNL 2013. S/p complete hysterectomy with BSO 1991 (64yo) for endometriosis. She did have HRT for a year. H/o DVT as teenager when on OCP. Early surgical menopause.  DEXA scan -to consider, declines at this time. Lung cancer screening - ~30 PY history. Discussed. Declines screening at this time. May consider when she gets medicare Flu shotyearly Tetanus shot -declines.  Pneumovax -08/2017.  Shingrix - discussed would be interested  Covid vaccine - discussed  Advanced directive discussion - has not set up. Discussed. HCPOA would be local daughter. Advanced directive packet provided today.  Seat belt use discussed  Sunscreen use discussed, no changing moles  Ex smoker - quit 2010, prior ~30 PY hx. Children still smoke  Alcohol - none  Dentist q6 mo - [facility] Eye exam yearly Bowel - some constipation to vicodin, managed with stool softener  Bladder - no incontinence   Lives alone, no pets Occ: [facility] Activity: no  regular exercise - she does have stationary bicycle but hasn't used Diet: good water, fruits/vegetables daily     Relevant past medical, surgical, family and social history reviewed and updated as indicated. Interim medical history since our last visit reviewed. Allergies and medications reviewed and updated. Outpatient Medications Prior to Visit  Medication Sig Dispense Refill  . Blood Glucose Monitoring Suppl (ONE TOUCH ULTRA MINI) w/Device KIT Use to check sugar once daily. Dx: E11.9 100 each 3  . buPROPion (WELLBUTRIN SR) 100 MG 12 hr tablet TAKE 1 TABLET BY MOUTH  DAILY 90 tablet 0  . carisoprodol (SOMA) 350 MG tablet QHS PRN - reported on 05/24/2016  0  . cholecalciferol (VITAMIN D) 1000 UNITS tablet Take 1,000 Units by mouth daily.    . desonide (DESOWEN) 0.05 % cream apply to affected area twice a day 30 g 0  . fluticasone (FLONASE) 50 MCG/ACT nasal spray USE 2 SPRAYS IN BOTH  NOSTRILS DAILY 48 g 0  . glucose blood (ONE TOUCH ULTRA TEST) test strip Use to check sugar once daily. Dx: E11.9 100 each 3  . hydrochlorothiazide (MICROZIDE) 12.5 MG capsule TAKE 1 CAPSULE BY MOUTH  DAILY 90 capsule 3  . Hydrocodone-Acetaminophen 5-300 MG TABS as needed.   0  . levocetirizine (XYZAL) 5 MG tablet Take 5 mg by mouth every evening.    . metFORMIN (GLUCOPHAGE) 500 MG tablet TAKE 1 TABLET BY MOUTH  DAILY 90 tablet 3  . montelukast (SINGULAIR) 10 MG tablet   TAKE 1 TABLET BY MOUTH AT  BEDTIME 90 tablet 3  . Multiple Minerals-Vitamins (CALCIUM & VIT D3 BONE HEALTH PO) Take by mouth daily.    Marland Kitchen omeprazole (PRILOSEC) 40 MG capsule TAKE 1 CAPSULE BY MOUTH  DAILY 90 capsule 3  . ONETOUCH DELICA LANCETS 12I MISC Use to check sugar once daily. Dx: E11.9 100 each 3  . valsartan (DIOVAN) 160 MG tablet TAKE 1 TABLET BY MOUTH  DAILY 90 tablet 3  . vitamin C (ASCORBIC ACID) 500 MG tablet Take 500 mg by mouth daily.    Marland Kitchen tretinoin (RETIN-A) 0.05 % cream Apply 1 application topically at bedtime.     No  facility-administered medications prior to visit.     Per HPI unless specifically indicated in ROS section below Review of Systems  Constitutional: Negative for activity change, appetite change, chills, fatigue, fever and unexpected weight change.  HENT: Negative for hearing loss.   Eyes: Negative for visual disturbance.  Respiratory: Negative for cough, chest tightness, shortness of breath and wheezing.   Cardiovascular: Negative for chest pain, palpitations and leg swelling.  Gastrointestinal: Negative for abdominal distention, abdominal pain, blood in stool, constipation, diarrhea, nausea and vomiting.  Genitourinary: Negative for difficulty urinating and hematuria.  Musculoskeletal: Negative for arthralgias, myalgias and neck pain.  Skin: Negative for rash.  Neurological: Negative for dizziness, seizures, syncope and headaches.  Hematological: Negative for adenopathy. Does not bruise/bleed easily.  Psychiatric/Behavioral: Negative for dysphoric mood. The patient is not nervous/anxious.    Objective:    BP 140/80 (BP Location: Left Arm, Patient Position: Sitting, Cuff Size: Normal)   Pulse 90   Temp 97.8 F (36.6 C) (Temporal)   Ht 4' 9.75" (1.467 m)   Wt 163 lb 7 oz (74.1 kg)   SpO2 97%   BMI 34.45 kg/m   Wt Readings from Last 3 Encounters:  03/01/20 163 lb 7 oz (74.1 kg)  08/03/19 167 lb 4 oz (75.9 kg)  05/14/19 166 lb 1 oz (75.3 kg)    Physical Exam Vitals and nursing note reviewed.  Constitutional:      General: She is not in acute distress.    Appearance: Normal appearance. She is well-developed. She is obese. She is not ill-appearing.  HENT:     Head: Normocephalic and atraumatic.     Right Ear: Hearing, tympanic membrane, ear canal and external ear normal.     Left Ear: Hearing, tympanic membrane, ear canal and external ear normal.     Mouth/Throat:     Pharynx: Uvula midline.  Eyes:     General: No scleral icterus.    Extraocular Movements: Extraocular  movements intact.     Conjunctiva/sclera: Conjunctivae normal.     Pupils: Pupils are equal, round, and reactive to light.  Cardiovascular:     Rate and Rhythm: Normal rate and regular rhythm.     Pulses: Normal pulses.          Radial pulses are 2+ on the right side and 2+ on the left side.     Heart sounds: Normal heart sounds. No murmur.  Pulmonary:     Effort: Pulmonary effort is normal. No respiratory distress.     Breath sounds: Normal breath sounds. No wheezing, rhonchi or rales.  Abdominal:     General: Abdomen is flat. Bowel sounds are normal. There is no distension.     Palpations: Abdomen is soft. There is no mass.     Tenderness: There is no abdominal tenderness. There is no  guarding or rebound.     Hernia: No hernia is present.  Musculoskeletal:        General: Normal range of motion.     Cervical back: Normal range of motion and neck supple.     Right lower leg: No edema.     Left lower leg: No edema.  Lymphadenopathy:     Cervical: No cervical adenopathy.  Skin:    General: Skin is warm and dry.     Findings: No rash.  Neurological:     General: No focal deficit present.     Mental Status: She is alert and oriented to person, place, and time.     Comments: CN grossly intact, station and gait intact  Psychiatric:        Mood and Affect: Mood normal.        Behavior: Behavior normal.        Thought Content: Thought content normal.        Judgment: Judgment normal.       Results for orders placed or performed in visit on 02/24/20  VITAMIN D 25 Hydroxy (Vit-D Deficiency, Fractures)  Result Value Ref Range   Vit D, 25-Hydroxy 31.4 30.0 - 100.0 ng/mL  Lipid panel  Result Value Ref Range   Cholesterol, Total 142 100 - 199 mg/dL   Triglycerides 150 (H) 0 - 149 mg/dL   HDL 25 (L) >39 mg/dL   VLDL Cholesterol Cal 27 5 - 40 mg/dL   LDL Chol Calc (NIH) 90 0 - 99 mg/dL   Chol/HDL Ratio 5.7 (H) 0.0 - 4.4 ratio  Comprehensive metabolic panel  Result Value Ref Range     Glucose 129 (H) 65 - 99 mg/dL   BUN 14 8 - 27 mg/dL   Creatinine, Ser 0.76 0.57 - 1.00 mg/dL   GFR calc non Af Amer 83 >59 mL/min/1.73   GFR calc Af Amer 96 >59 mL/min/1.73   BUN/Creatinine Ratio 18 12 - 28   Sodium 144 134 - 144 mmol/L   Potassium 4.0 3.5 - 5.2 mmol/L   Chloride 103 96 - 106 mmol/L   CO2 25 20 - 29 mmol/L   Calcium 9.1 8.7 - 10.3 mg/dL   Total Protein 6.9 6.0 - 8.5 g/dL   Albumin 4.2 3.8 - 4.8 g/dL   Globulin, Total 2.7 1.5 - 4.5 g/dL   Albumin/Globulin Ratio 1.6 1.2 - 2.2   Bilirubin Total 0.4 0.0 - 1.2 mg/dL   Alkaline Phosphatase 67 39 - 117 IU/L   AST 22 0 - 40 IU/L   ALT 31 0 - 32 IU/L  Hemoglobin A1c  Result Value Ref Range   Hgb A1c MFr Bld 6.9 (H) 4.8 - 5.6 %   Est. average glucose Bld gHb Est-mCnc 151 mg/dL   Depression screen [facility] 2/9 03/01/2020 02/10/2019 02/07/2018  Decreased Interest 0 1 0  Down, Depressed, Hopeless 0 1 0  PHQ - 2 Score 0 2 0  Altered sleeping 1 0 -  Tired, decreased energy 2 2 -  Change in appetite 0 1 -  Feeling bad or failure about yourself  0 0 -  Trouble concentrating 0 0 -  Moving slowly or fidgety/restless 0 0 -  Suicidal thoughts 0 0 -  PHQ-9 Score 3 5 -    GAD 7 : Generalized Anxiety Score 03/01/2020 02/10/2019  Nervous, Anxious, on Edge 0 2  Control/stop worrying 1 1  Worry too much - different things 1 2  Trouble relaxing 2 3  Restless 0 1  Easily annoyed or irritable 0 1  Afraid - awful might happen 0 0  Total GAD 7 Score 4 10   Assessment & Plan:  This visit occurred during the SARS-CoV-2 public health emergency.  Safety protocols were in place, including screening questions prior to the visit, additional usage of staff PPE, and extensive cleaning of exam room while observing appropriate contact time as indicated for disinfecting solutions.   Problem List Items Addressed This Visit    Vitamin D deficiency    Continue daily replacement.       Statin intolerance   Scoliosis of lumbosacral spine   Routine  general medical examination at a health care facility - Primary    Preventative protocols reviewed and updated unless pt declined. Discussed healthy diet and lifestyle.  Reviewed COVID vaccines - encouraged she take one.       Restless legs syndrome    Newly noted - will check iron levels. Consider requip in place of soma.       Relevant Orders   CBC with Differential/Platelet   Iron and TIBC   Ferritin   Obesity, Class I, BMI 30-34.9    Encouraged healthy diet and lifestyle to affect sustainable weight loss.       Hypertension    Chronic, stable. Continue current regimen.       Dyslipidemia associated with type 2 diabetes mellitus (HCC)    Has not tolerated statins - consider weekly low potency statin like lovastatin. Encouraged increased aerobic exercise to improve HDL.  The 10-year ASCVD risk score (Goff DC Jr., et al., 2013) is: 17.5%   Values used to calculate the score:     Age: 63 years     Sex: Female     Is Non-Hispanic African American: No     Diabetic: Yes     Tobacco smoker: No     Systolic Blood Pressure: 140 mmHg     Is BP treated: Yes     HDL Cholesterol: 25 mg/dL     Total Cholesterol: 142 mg/dL       Diabetes mellitus type 2, controlled (HCC)    Chronic, well controlled on metformin. Continue.       Chronic low back pain    Longstanding. Will trial zanaflex in place of soma.  Previously on soma and hydrocodone through neurosurgeon who retired      Relevant Medications   tiZANidine (ZANAFLEX) 2 MG tablet   Caregiver stress    Stable period on wellbutrin daily. Desires to continue.       Advanced care planning/counseling discussion    Advanced directive discussion - has not set up. Discussed. HCPOA would be local daughter. Advanced directive packet provided today.           Meds ordered this encounter  Medications  . DISCONTD: tiZANidine (ZANAFLEX) 2 MG tablet    Sig: Take 1 tablet (2 mg total) by mouth 2 (two) times daily as needed for  muscle spasms.    Dispense:  30 tablet    Refill:  0    In place of soma. Formulate tab vs capsule base on affordability  . tiZANidine (ZANAFLEX) 2 MG tablet    Sig: Take 1 tablet (2 mg total) by mouth 2 (two) times daily as needed for muscle spasms.    Dispense:  30 tablet    Refill:  0    In place of soma. Formulate tab vs capsule base on affordability   Orders Placed This   Encounter  Procedures  . CBC with Differential/Platelet  . Iron and TIBC  . Ferritin    Patient instructions: Iron levels checked today for possible restless legs.  Consider lung cancer screening.  We will order bone density scan at that time.  I recommend the covid vaccine when you can get it.  Look at [place] if you want drive through location  Work on advanced directive packet.  Schedule welcome to medicare visit later this year.  Good to see you. Watch sugar in diet.  Try tizanidine 53m for muscle spasm at night.   Follow up plan: Return in about 6 months (around 09/01/2020) for medicare wellness visit.  JRia Bush MD

## 2020-03-02 LAB — FERRITIN: Ferritin: 92 ng/mL (ref 15–150)

## 2020-03-02 LAB — CBC WITH DIFFERENTIAL/PLATELET
Basophils Absolute: 0 10*3/uL (ref 0.0–0.2)
Basos: 1 %
EOS (ABSOLUTE): 0.3 10*3/uL (ref 0.0–0.4)
Eos: 4 %
Hematocrit: 37 % (ref 34.0–46.6)
Hemoglobin: 12.7 g/dL (ref 11.1–15.9)
Immature Grans (Abs): 0 10*3/uL (ref 0.0–0.1)
Immature Granulocytes: 0 %
Lymphocytes Absolute: 2.2 10*3/uL (ref 0.7–3.1)
Lymphs: 28 %
MCH: 29.8 pg (ref 26.6–33.0)
MCHC: 34.3 g/dL (ref 31.5–35.7)
MCV: 87 fL (ref 79–97)
Monocytes Absolute: 0.6 10*3/uL (ref 0.1–0.9)
Monocytes: 8 %
Neutrophils Absolute: 4.7 10*3/uL (ref 1.4–7.0)
Neutrophils: 59 %
Platelets: 298 10*3/uL (ref 150–450)
RBC: 4.26 x10E6/uL (ref 3.77–5.28)
RDW: 13.4 % (ref 11.7–15.4)
WBC: 7.8 10*3/uL (ref 3.4–10.8)

## 2020-03-02 LAB — IRON AND TIBC
Iron Saturation: 17 % (ref 15–55)
Iron: 54 ug/dL (ref 27–139)
Total Iron Binding Capacity: 326 ug/dL (ref 250–450)
UIBC: 272 ug/dL (ref 118–369)

## 2020-03-06 ENCOUNTER — Other Ambulatory Visit: Payer: Self-pay | Admitting: Family Medicine

## 2020-03-20 ENCOUNTER — Other Ambulatory Visit: Payer: Self-pay | Admitting: Family Medicine

## 2020-04-05 ENCOUNTER — Other Ambulatory Visit: Payer: Self-pay

## 2020-04-05 NOTE — Telephone Encounter (Signed)
Pt left v/m ;that she recently went on medicare and [place] pharmacy is not with her ins now; pt request refill metformin 500 mg to [facility] [address]. Pt has 2 days of metformin left.Please advise.

## 2020-04-06 MED ORDER — METFORMIN HCL 500 MG PO TABS: 500.0000 mg | ORAL_TABLET | Freq: Every day | ORAL | 3 refills | Status: DC

## 2020-04-06 NOTE — Telephone Encounter (Signed)
Pt's chart updated.  E-scribed refill.

## 2020-04-11 ENCOUNTER — Other Ambulatory Visit: Payer: Self-pay | Admitting: Family Medicine

## 2020-04-12 NOTE — Telephone Encounter (Signed)
Tizanidine Last filled:  03/01/20, #30 Last OV:  03/01/20, CPE Next OV:  none

## 2020-05-24 ENCOUNTER — Other Ambulatory Visit: Payer: Self-pay | Admitting: Family Medicine

## 2020-06-09 ENCOUNTER — Other Ambulatory Visit: Payer: Self-pay

## 2020-06-09 NOTE — Telephone Encounter (Signed)
Pt left v/m to ck on status of refill for tizanidine that pt thinks [facility] [address] requested on 06/06/20. I do not see refill request for tizanidine.  Name of Medication: tiznidine 2 mg Name of Pharmacy:[facility] [address]  Last Fill or Written Date and Quantity: # 30 on 04/14/20 Last Office Visit and Type: 03/01/20 annual Next Office Visit and Type: none scheduled

## 2020-06-11 MED ORDER — TIZANIDINE HCL 2 MG PO TABS: ORAL_TABLET | ORAL | 3 refills | Status: DC

## 2020-06-11 NOTE — Telephone Encounter (Signed)
ERx 

## 2020-07-14 ENCOUNTER — Other Ambulatory Visit: Payer: Self-pay | Admitting: Family Medicine

## 2020-07-15 NOTE — Telephone Encounter (Signed)
Patient returned call.Patient prefers rx go to [facility].

## 2020-07-15 NOTE — Telephone Encounter (Signed)
Metformin was refilled on 04/06/2020 for #90 with 3 refills to [facility].  I have left message for [place] to call back to let us know if she wants this refilled with Mail order or does she plan getting her Metformin from [place].

## 2020-08-08 DIAGNOSIS — H6122 Impacted cerumen, left ear: Secondary | ICD-10-CM | POA: Diagnosis not present

## 2020-08-08 DIAGNOSIS — H903 Sensorineural hearing loss, bilateral: Secondary | ICD-10-CM | POA: Insufficient documentation

## 2020-09-15 ENCOUNTER — Other Ambulatory Visit: Payer: Self-pay | Admitting: Family Medicine

## 2020-10-23 ENCOUNTER — Other Ambulatory Visit: Payer: Self-pay | Admitting: Family Medicine

## 2020-11-23 ENCOUNTER — Other Ambulatory Visit: Payer: Self-pay | Admitting: Family Medicine

## 2020-11-23 DIAGNOSIS — Z1231 Encounter for screening mammogram for malignant neoplasm of breast: Secondary | ICD-10-CM

## 2020-12-04 ENCOUNTER — Other Ambulatory Visit: Payer: Self-pay | Admitting: Family Medicine

## 2020-12-04 DIAGNOSIS — E1169 Type 2 diabetes mellitus with other specified complication: Secondary | ICD-10-CM

## 2020-12-04 DIAGNOSIS — E559 Vitamin D deficiency, unspecified: Secondary | ICD-10-CM

## 2020-12-05 ENCOUNTER — Other Ambulatory Visit: Payer: Self-pay

## 2020-12-05 ENCOUNTER — Other Ambulatory Visit (INDEPENDENT_AMBULATORY_CARE_PROVIDER_SITE_OTHER): Payer: Medicare Other

## 2020-12-05 DIAGNOSIS — E559 Vitamin D deficiency, unspecified: Secondary | ICD-10-CM

## 2020-12-05 DIAGNOSIS — E785 Hyperlipidemia, unspecified: Secondary | ICD-10-CM | POA: Diagnosis not present

## 2020-12-05 DIAGNOSIS — E1169 Type 2 diabetes mellitus with other specified complication: Secondary | ICD-10-CM

## 2020-12-05 LAB — COMPREHENSIVE METABOLIC PANEL
ALT: 38 U/L — ABNORMAL HIGH (ref 0–35)
AST: 31 U/L (ref 0–37)
Albumin: 4.1 g/dL (ref 3.5–5.2)
Alkaline Phosphatase: 55 U/L (ref 39–117)
BUN: 16 mg/dL (ref 6–23)
CO2: 29 mEq/L (ref 19–32)
Calcium: 9.3 mg/dL (ref 8.4–10.5)
Chloride: 104 mEq/L (ref 96–112)
Creatinine, Ser: 0.74 mg/dL (ref 0.40–1.20)
GFR: 84.76 mL/min (ref >60.00)
Glucose, Bld: 110 mg/dL — ABNORMAL HIGH (ref 70–99)
Potassium: 3.7 mEq/L (ref 3.5–5.1)
Sodium: 141 mEq/L (ref 135–145)
Total Bilirubin: 0.4 mg/dL (ref 0.2–1.2)
Total Protein: 7.5 g/dL (ref 6.0–8.3)

## 2020-12-05 LAB — VITAMIN D 25 HYDROXY (VIT D DEFICIENCY, FRACTURES): VITD: 33.02 ng/mL (ref 30.00–100.00)

## 2020-12-05 LAB — HEMOGLOBIN A1C: Hgb A1c MFr Bld: 7.3 % — ABNORMAL HIGH (ref 4.6–6.5)

## 2020-12-05 LAB — LIPID PANEL
Cholesterol: 137 mg/dL (ref 0–200)
HDL: 24.7 mg/dL — ABNORMAL LOW (ref >39.00)
LDL Cholesterol: 85 mg/dL (ref 0–99)
NonHDL: 112.34
Total CHOL/HDL Ratio: 6
Triglycerides: 137 mg/dL (ref 0.0–149.0)
VLDL: 27.4 mg/dL (ref 0.0–40.0)

## 2020-12-12 ENCOUNTER — Ambulatory Visit (INDEPENDENT_AMBULATORY_CARE_PROVIDER_SITE_OTHER): Payer: Medicare Other | Admitting: Family Medicine

## 2020-12-12 ENCOUNTER — Other Ambulatory Visit: Payer: Self-pay

## 2020-12-12 ENCOUNTER — Encounter: Payer: Self-pay | Admitting: Family Medicine

## 2020-12-12 VITALS — BP 128/78 | HR 69 | Temp 97.7°F | Ht <= 58 in | Wt 166.0 lb

## 2020-12-12 DIAGNOSIS — Z7189 Other specified counseling: Secondary | ICD-10-CM

## 2020-12-12 DIAGNOSIS — K219 Gastro-esophageal reflux disease without esophagitis: Secondary | ICD-10-CM

## 2020-12-12 DIAGNOSIS — E785 Hyperlipidemia, unspecified: Secondary | ICD-10-CM

## 2020-12-12 DIAGNOSIS — E1169 Type 2 diabetes mellitus with other specified complication: Secondary | ICD-10-CM | POA: Diagnosis not present

## 2020-12-12 DIAGNOSIS — E2839 Other primary ovarian failure: Secondary | ICD-10-CM | POA: Diagnosis not present

## 2020-12-12 DIAGNOSIS — I10 Essential (primary) hypertension: Secondary | ICD-10-CM

## 2020-12-12 DIAGNOSIS — Z0000 Encounter for general adult medical examination without abnormal findings: Secondary | ICD-10-CM | POA: Insufficient documentation

## 2020-12-12 DIAGNOSIS — Z789 Other specified health status: Secondary | ICD-10-CM

## 2020-12-12 DIAGNOSIS — Z636 Dependent relative needing care at home: Secondary | ICD-10-CM

## 2020-12-12 DIAGNOSIS — E559 Vitamin D deficiency, unspecified: Secondary | ICD-10-CM

## 2020-12-12 MED ORDER — LOVASTATIN 20 MG PO TABS: 20.0000 mg | ORAL_TABLET | ORAL | 3 refills | Status: DC

## 2020-12-12 MED ORDER — SERTRALINE HCL 25 MG PO TABS: 25.0000 mg | ORAL_TABLET | Freq: Every day | ORAL | 6 refills | Status: DC

## 2020-12-12 MED ORDER — METFORMIN HCL 500 MG PO TABS: 500.0000 mg | ORAL_TABLET | Freq: Two times a day (BID) | ORAL | 3 refills | Status: DC

## 2020-12-12 NOTE — Assessment & Plan Note (Signed)
Chronic, stable. Continue current regimen. 

## 2020-12-12 NOTE — Assessment & Plan Note (Signed)
Stable period on daily PPI  

## 2020-12-12 NOTE — Assessment & Plan Note (Signed)
Advanced directive discussion -has not set up.Discussed.HCPOA would belocal daughter Olivia Benton). Advanced directive packet provided today.

## 2020-12-12 NOTE — Patient Instructions (Addendum)
EKG today.  We will refer you for bone density scan.  You are due for 2 pneumonia shots, first one will be prevnar-13. Let us know when interested.  Increase regular aerobic exercise. Increase metformin to 500mg  twice daily Good to see you today. Return as needed or in 6 months for follow up visit.  Bring copy of living will when complete.  Try sertraline 25mg  in place of wellbutrin for mood.   Health Maintenance After Age 65 After age 24, you are at a higher risk for certain long-term diseases and infections as well as injuries from falls. Falls are a major cause of broken bones and head injuries in people who are older than age 89. Getting regular preventive care can help to keep you healthy and well. Preventive care includes getting regular testing and making lifestyle changes as recommended by your health care provider. Talk with your health care provider about:  Which screenings and tests you should have. A screening is a test that checks for a disease when you have no symptoms.  A diet and exercise plan that is right for you. What should I know about screenings and tests to prevent falls? Screening and testing are the best ways to find a health problem early. Early diagnosis and treatment give you the best chance of managing medical conditions that are common after age 89. Certain conditions and lifestyle choices may make you more likely to have a fall. Your health care provider may recommend:  Regular vision checks. Poor vision and conditions such as cataracts can make you more likely to have a fall. If you wear glasses, make sure to get your prescription updated if your vision changes.  Medicine review. Work with your health care provider to regularly review all of the medicines you are taking, including over-the-counter medicines. Ask your health care provider about any side effects that may make you more likely to have a fall. Tell your health care provider if any medicines that you  take make you feel dizzy or sleepy.  Osteoporosis screening. Osteoporosis is a condition that causes the bones to get weaker. This can make the bones weak and cause them to break more easily.  Blood pressure screening. Blood pressure changes and medicines to control blood pressure can make you feel dizzy.  Strength and balance checks. Your health care provider may recommend certain tests to check your strength and balance while standing, walking, or changing positions.  Foot health exam. Foot pain and numbness, as well as not wearing proper footwear, can make you more likely to have a fall.  Depression screening. You may be more likely to have a fall if you have a fear of falling, feel emotionally low, or feel unable to do activities that you used to do.  Alcohol use screening. Using too much alcohol can affect your balance and may make you more likely to have a fall. What actions can I take to lower my risk of falls? General instructions  Talk with your health care provider about your risks for falling. Tell your health care provider if: ? You fall. Be sure to tell your health care provider about all falls, even ones that seem minor. ? You feel dizzy, sleepy, or off-balance.  Take over-the-counter and prescription medicines only as told by your health care provider. These include any supplements.  Eat a healthy diet and maintain a healthy weight. A healthy diet includes low-fat dairy products, low-fat (lean) meats, and fiber from whole grains, beans, and  lots of fruits and vegetables. Home safety  Remove any tripping hazards, such as rugs, cords, and clutter.  Install safety equipment such as grab bars in bathrooms and safety rails on stairs.  Keep rooms and walkways well-lit. Activity   Follow a regular exercise program to stay fit. This will help you maintain your balance. Ask your health care provider what types of exercise are appropriate for you.  If you need a cane or  walker, use it as recommended by your health care provider.  Wear supportive shoes that have nonskid soles. Lifestyle  Do not drink alcohol if your health care provider tells you not to drink.  If you drink alcohol, limit how much you have: ? 0-1 drink a day for women. ? 0-2 drinks a day for men.  Be aware of how much alcohol is in your drink. In the [place] one drink equals one typical bottle of beer (12 oz), one-half glass of wine (5 oz), or one shot of hard liquor (1 oz).  Do not use any products that contain nicotine or tobacco, such as cigarettes and e-cigarettes. If you need help quitting, ask your health care provider. Summary  Having a healthy lifestyle and getting preventive care can help to protect your health and wellness after age 48.  Screening and testing are the best way to find a health problem early and help you avoid having a fall. Early diagnosis and treatment give you the best chance for managing medical conditions that are more common for people who are older than age 69.  Falls are a major cause of broken bones and head injuries in people who are older than age 61. Take precautions to prevent a fall at home.  Work with your health care provider to learn what changes you can make to improve your health and wellness and to prevent falls. This information is not intended to replace advice given to you by your health care provider. Make sure you discuss any questions you have with your health care provider. Document Revised: 03/26/2019 Document Reviewed: 10/16/2017 Elsevier Patient Education  2020 [employer].

## 2020-12-12 NOTE — Assessment & Plan Note (Addendum)
Support provided. Stop wellbutrin. Trial sertraline 25mg  daily.

## 2020-12-12 NOTE — Assessment & Plan Note (Signed)
Continue daily vit D 1000 IU daily.

## 2020-12-12 NOTE — Assessment & Plan Note (Signed)
Encouraged healthy diet and lifestyle choices to affect sustainable weight loss.  ?

## 2020-12-12 NOTE — Assessment & Plan Note (Signed)
Chronic, h/o statin intolerance.  Will try once weekly low potency lovastatin.  The 10-year ASCVD risk score Denman George DC Montez Hageman., et al., 2013) is: 15.9%   Values used to calculate the score:     Age: 64 years     Sex: Female     Is Non-Hispanic African American: No     Diabetic: Yes     Tobacco smoker: No     Systolic Blood Pressure: 128 mmHg     Is BP treated: Yes     HDL Cholesterol: 24.7 mg/dL     Total Cholesterol: 137 mg/dL

## 2020-12-12 NOTE — Assessment & Plan Note (Addendum)
Deteriorated control. Increase metformin to 500mg  bid.  Reassess control.  RTC 6 mo DM f/u visit.

## 2020-12-12 NOTE — Progress Notes (Addendum)
Patient ID: Olivia Benton, female    DOB: 1956-09-22, 64 y.o.   MRN: [id number]  This visit was conducted in person.  BP 128/78 (BP Location: Left Arm, Patient Position: Sitting, Cuff Size: Large)   Pulse 69   Temp 97.7 F (36.5 C) (Temporal)   Ht 4' 9.5" (1.461 m)   Wt 166 lb (75.3 kg)   SpO2 99%   BMI 35.30 kg/m    CC: welcome to medicare visit  Subjective:   HPI: Olivia Benton is a 64 y.o. female presenting on 12/12/2020 for Medicare Wellness (Here for Welcome to Medicare exam. )    Hearing Screening   '125Hz'  '250Hz'  '500Hz'  '1000Hz'  '2000Hz'  '3000Hz'  '4000Hz'  '6000Hz'  '8000Hz'   Right ear:   '20 20 20  20    ' Left ear:   '20 20 20  20      ' Visual Acuity Screening   Right eye Left eye Both eyes  Without correction:     With correction: '20/25 20/20 20/20 '  Comments: Last eye exam, 12/2019.   [facility] Visit from 12/12/2020 in [facility]  PHQ-2 Total Score 1      Fall Risk  12/12/2020  Falls in the past year? 0    Depression - no noted benefit since on wellbutrin at least since 03/2020. Requests trial different medication. Increased stress caring for her mother.   Preventative: Colon cancer screening -pt statesh/onormal colonoscopy with diverticulosis 2014 by Dr Tamala Julian surgery, no records available. rec rpt 10 yrs.Told had diverticulosis.  Breast cancer screening -12/2020WNL. She does breast exams at home. Upcoming one scheduled this Wednesday.  Well woman exam -pap WNL 2013. S/p complete hysterectomy with BSO 1991 (64yo) for endometriosis. She did have HRT for a year. H/o DVT as teenager when on OCP. Early surgical menopause.  DEXA will update.  Lung cancer screening - ~30 PY history.Discussed. Declines screening at this time. May consider it in the future Flu shotyearly Covid vaccine - Moderna 02/2020, 03/2020  Tetanus shot -declines  Pneumovax -08/2017  prevnar 13 - eligible, declines today  Shingrix - discussed would be  interested  Advanced directive discussion -has not set up.Discussed.HCPOA would belocal daughter Olivia Benton). Advanced directive packet provided today.  Seat belt use discussed  Sunscreen use discussed, no changing moles  Ex smoker - quit 2010, prior ~30 PY hx. Children still smoke  Alcohol - none Dentist q6 mo  Eye exam yearly Bowel - no constipation  Bladder - no incontinence  Doesn't meet criteria for AAA screen (no fmhx).   Lives alone, no pets Occ: [facility] Activity: no regular exercise - she does have stationary bicycle but hasn't used Diet: good water, fruits/vegetables daily     Relevant past medical, surgical, family and social history reviewed and updated as indicated. Interim medical history since our last visit reviewed. Allergies and medications reviewed and updated. Outpatient Medications Prior to Visit  Medication Sig Dispense Refill  . Blood Glucose Monitoring Suppl (ONE TOUCH ULTRA MINI) w/Device KIT Use to check sugar once daily. Dx: E11.9 100 each 3  . cholecalciferol (VITAMIN D) 1000 UNITS tablet Take 1,000 Units by mouth daily.    Marland Kitchen desonide (DESOWEN) 0.05 % cream apply to affected area twice a day 30 g 0  . fluticasone (FLONASE) 50 MCG/ACT nasal spray USE 2 SPRAYS IN BOTH  NOSTRILS DAILY 48 g 2  . glucose blood (ONE TOUCH ULTRA TEST) test strip Use to check sugar once daily. Dx: E11.9 100  each 3  . hydrochlorothiazide (MICROZIDE) 12.5 MG capsule TAKE 1 CAPSULE BY MOUTH  DAILY 90 capsule 1  . levocetirizine (XYZAL) 5 MG tablet Take 5 mg by mouth every evening.    . montelukast (SINGULAIR) 10 MG tablet TAKE 1 TABLET BY MOUTH AT  BEDTIME 90 tablet 3  . Multiple Minerals-Vitamins (CALCIUM & VIT D3 BONE HEALTH PO) Take by mouth daily.    Marland Kitchen omeprazole (PRILOSEC) 40 MG capsule TAKE 1 CAPSULE BY MOUTH  DAILY 90 capsule 3  . ONETOUCH DELICA LANCETS 63W MISC Use to check sugar once daily. Dx: E11.9 100 each 3  . tiZANidine (ZANAFLEX) 2 MG tablet TAKE 1 TABLET(2  MG) BY MOUTH TWICE DAILY AS NEEDED FOR MUSCLE SPASMS 30 tablet 3  . valsartan (DIOVAN) 160 MG tablet TAKE 1 TABLET BY MOUTH  DAILY 90 tablet 1  . vitamin C (ASCORBIC ACID) 500 MG tablet Take 500 mg by mouth daily.    Marland Kitchen buPROPion (WELLBUTRIN SR) 100 MG 12 hr tablet TAKE 1 TABLET BY MOUTH  DAILY 90 tablet 3  . metFORMIN (GLUCOPHAGE) 500 MG tablet TAKE 1 TABLET BY MOUTH  DAILY 90 tablet 3  . carisoprodol (SOMA) 350 MG tablet QHS PRN - reported on 05/24/2016  0  . Hydrocodone-Acetaminophen 5-300 MG TABS as needed.   0   No facility-administered medications prior to visit.     Per HPI unless specifically indicated in ROS section below Review of Systems Objective:  BP 128/78 (BP Location: Left Arm, Patient Position: Sitting, Cuff Size: Large)   Pulse 69   Temp 97.7 F (36.5 C) (Temporal)   Ht 4' 9.5" (1.461 m)   Wt 166 lb (75.3 kg)   SpO2 99%   BMI 35.30 kg/m   Wt Readings from Last 3 Encounters:  12/12/20 166 lb (75.3 kg)  03/01/20 163 lb 7 oz (74.1 kg)  08/03/19 167 lb 4 oz (75.9 kg)      Physical Exam Vitals and nursing note reviewed.  Constitutional:      General: She is not in acute distress.    Appearance: Normal appearance. She is well-developed and well-nourished. She is obese. She is not ill-appearing.  HENT:     Head: Normocephalic and atraumatic.     Right Ear: Hearing, tympanic membrane, ear canal and external ear normal.     Left Ear: Hearing, tympanic membrane, ear canal and external ear normal.     Mouth/Throat:     Mouth: Oropharynx is clear and moist and mucous membranes are normal.     Pharynx: No posterior oropharyngeal edema.  Eyes:     General: No scleral icterus.    Extraocular Movements: Extraocular movements intact and EOM normal.     Conjunctiva/sclera: Conjunctivae normal.     Pupils: Pupils are equal, round, and reactive to light.  Neck:     Thyroid: No thyroid mass or thyromegaly.     Vascular: No carotid bruit.  Cardiovascular:     Rate and  Rhythm: Normal rate and regular rhythm.     Pulses: Normal pulses and intact distal pulses.          Radial pulses are 2+ on the right side and 2+ on the left side.     Heart sounds: Normal heart sounds. No murmur heard.   Pulmonary:     Effort: Pulmonary effort is normal. No respiratory distress.     Breath sounds: Normal breath sounds. No wheezing, rhonchi or rales.  Abdominal:     General:  Abdomen is flat. Bowel sounds are normal. There is no distension.     Palpations: Abdomen is soft. There is no mass.     Tenderness: There is no abdominal tenderness. There is no guarding or rebound.     Hernia: No hernia is present.  Musculoskeletal:        General: No edema. Normal range of motion.     Cervical back: Normal range of motion and neck supple.     Right lower leg: No edema.     Left lower leg: No edema.  Lymphadenopathy:     Cervical: No cervical adenopathy.  Skin:    General: Skin is warm and dry.     Findings: No rash.  Neurological:     General: No focal deficit present.     Mental Status: She is alert and oriented to person, place, and time.     Comments:  CN grossly intact, station and gait intact Recall 3/3 Calculation 5/5 DLROW  Psychiatric:        Mood and Affect: Mood and affect and mood normal.        Behavior: Behavior normal.        Thought Content: Thought content normal.        Judgment: Judgment normal.       Results for orders placed or performed in visit on 12/05/20  VITAMIN D 25 Hydroxy (Vit-D Deficiency, Fractures)  Result Value Ref Range   VITD 33.02 30.00 - 100.00 ng/mL  Hemoglobin A1c  Result Value Ref Range   Hgb A1c MFr Bld 7.3 (H) 4.6 - 6.5 %  Comprehensive metabolic panel  Result Value Ref Range   Sodium 141 135 - 145 mEq/L   Potassium 3.7 3.5 - 5.1 mEq/L   Chloride 104 96 - 112 mEq/L   CO2 29 19 - 32 mEq/L   Glucose, Bld 110 (H) 70 - 99 mg/dL   BUN 16 6 - 23 mg/dL   Creatinine, Ser 0.74 0.40 - 1.20 mg/dL   Total Bilirubin 0.4 0.2 -  1.2 mg/dL   Alkaline Phosphatase 55 39 - 117 U/L   AST 31 0 - 37 U/L   ALT 38 (H) 0 - 35 U/L   Total Protein 7.5 6.0 - 8.3 g/dL   Albumin 4.1 3.5 - 5.2 g/dL   GFR 84.76 >60.00 mL/min   Calcium 9.3 8.4 - 10.5 mg/dL  Lipid panel  Result Value Ref Range   Cholesterol 137 0 - 200 mg/dL   Triglycerides 137.0 0.0 - 149.0 mg/dL   HDL 24.70 (L) >39.00 mg/dL   VLDL 27.4 0.0 - 40.0 mg/dL   LDL Cholesterol 85 0 - 99 mg/dL   Total CHOL/HDL Ratio 6    NonHDL 112.34    Depression screen [facility] 2/9 12/12/2020 03/01/2020 02/10/2019 02/07/2018  Decreased Interest 0 0 1 0  Down, Depressed, Hopeless 1 0 1 0  PHQ - 2 Score 1 0 2 0  Altered sleeping 0 1 0 -  Tired, decreased energy 0 2 2 -  Change in appetite 2 0 1 -  Feeling bad or failure about yourself  0 0 0 -  Trouble concentrating 0 0 0 -  Moving slowly or fidgety/restless 0 0 0 -  Suicidal thoughts 0 0 0 -  PHQ-9 Score '3 3 5 ' -   GAD 7 : Generalized Anxiety Score 12/12/2020 03/01/2020 02/10/2019  Nervous, Anxious, on Edge 1 0 2  Control/stop worrying '1 1 1  ' Worry too much - different  things '1 1 2  ' Trouble relaxing '1 2 3  ' Restless 0 0 1  Easily annoyed or irritable 2 0 1  Afraid - awful might happen 1 0 0  Total GAD 7 Score '7 4 10   ' EKG - sinus bradycardia rate high 50s, normal axis, intervals, no acute ST/T changes.  Assessment & Plan:  This visit occurred during the SARS-CoV-2 public health emergency.  Safety protocols were in place, including screening questions prior to the visit, additional usage of staff PPE, and extensive cleaning of exam room while observing appropriate contact time as indicated for disinfecting solutions.   Problem List Items Addressed This Visit    Welcome to Medicare preventive visit - Primary    I have personally reviewed the Medicare Annual Wellness questionnaire and have noted 1. The patient's medical and social history 2. Their use of alcohol, tobacco or illicit drugs 3. Their current medications and  supplements 4. The patient's functional ability including ADL's, fall risks, home safety risks and hearing or visual impairment. Cognitive function has been assessed and addressed as indicated.  5. Diet and physical activity 6. Evidence for depression or mood disorders The patients weight, height, BMI have been recorded in the chart. I have made referrals, counseling and provided education to the patient based on review of the above and I have provided the pt with a written personalized care plan for preventive services. Provider list updated.. See scanned questionairre as needed for further documentation. Reviewed preventative protocols and updated unless pt declined.       Relevant Orders   EKG 12-Lead (Completed)   Vitamin D deficiency    Continue daily vit D 1000 IU daily.       Statin intolerance   Severe obesity (BMI 35.0-39.9) with comorbidity ([place])    Encouraged healthy diet and lifestyle choices to affect sustainable weight loss.       Relevant Medications   metFORMIN (GLUCOPHAGE) 500 MG tablet   Hypertension    Chronic, stable. Continue current regimen.       Relevant Medications   lovastatin (MEVACOR) 20 MG tablet   GERD (gastroesophageal reflux disease)    Stable period on daily PPI.       Dyslipidemia associated with type 2 diabetes mellitus (HCC)    Chronic, h/o statin intolerance.  Will try once weekly low potency lovastatin.  The 10-year ASCVD risk score Mikey Bussing DC Brooke Bonito., et al., 2013) is: 15.9%   Values used to calculate the score:     Age: 89 years     Sex: Female     Is Non-Hispanic African American: No     Diabetic: Yes     Tobacco smoker: No     Systolic Blood Pressure: 202 mmHg     Is BP treated: Yes     HDL Cholesterol: 24.7 mg/dL     Total Cholesterol: 137 mg/dL       Relevant Medications   metFORMIN (GLUCOPHAGE) 500 MG tablet   lovastatin (MEVACOR) 20 MG tablet   Diabetes mellitus type 2, controlled ([place])    Deteriorated control. Increase  metformin to 573m bid.  Reassess control.  RTC 6 mo DM f/u visit.       Relevant Medications   metFORMIN (GLUCOPHAGE) 500 MG tablet   lovastatin (MEVACOR) 20 MG tablet   Caregiver stress    Support provided. Stop wellbutrin. Trial sertraline 226mdaily.       Advanced care planning/counseling discussion    Advanced directive discussion -has not  set up.Discussed.HCPOA would belocal daughter Olivia Benton). Advanced directive packet provided today.        Other Visit Diagnoses    Estrogen deficiency       Relevant Orders   DG Bone Density       Meds ordered this encounter  Medications  . metFORMIN (GLUCOPHAGE) 500 MG tablet    Sig: Take 1 tablet (500 mg total) by mouth 2 (two) times daily with a meal.    Dispense:  180 tablet    Refill:  3    Note new instructions  . sertraline (ZOLOFT) 25 MG tablet    Sig: Take 1 tablet (25 mg total) by mouth daily.    Dispense:  30 tablet    Refill:  6    In place of wellbutrin  . lovastatin (MEVACOR) 20 MG tablet    Sig: Take 1 tablet (20 mg total) by mouth once a week.    Dispense:  13 tablet    Refill:  3   Orders Placed This Encounter  Procedures  . DG Bone Density    Standing Status:   Future    Standing Expiration Date:   12/12/2021    Order Specific Question:   Reason for Exam (SYMPTOM  OR DIAGNOSIS REQUIRED)    Answer:   early menopause, OP screen    Order Specific Question:   Preferred imaging location?    Answer:   [facility]  . EKG 12-Lead    Patient instructions: EKG today.  We will refer you for bone density scan.  You are due for 2 pneumonia shots, first one will be prevnar-13. Let us know when interested.  Increase regular aerobic exercise. Increase metformin to 524m twice daily Good to see you today. Return as needed or in 6 months for follow up visit.  Bring u[place]opy of living will when complete.  Try sertraline 276min place of wellbutrin for mood.   Follow up plan: Return in about 6  months (around 06/12/2021) for follow up visit.  JaRia BushMD

## 2020-12-12 NOTE — Assessment & Plan Note (Signed)

## 2020-12-14 ENCOUNTER — Ambulatory Visit
Admission: RE | Admit: 2020-12-14 | Discharge: 2020-12-14 | Disposition: A | Discharge status: Home / Self Care | Payer: Medicare Other | Source: Ambulatory Visit | Attending: Family Medicine | Admitting: Family Medicine

## 2020-12-14 ENCOUNTER — Other Ambulatory Visit: Payer: Self-pay

## 2020-12-14 DIAGNOSIS — Z1231 Encounter for screening mammogram for malignant neoplasm of breast: Secondary | ICD-10-CM

## 2021-02-06 ENCOUNTER — Encounter: Payer: Self-pay | Admitting: Family Medicine

## 2021-02-06 DIAGNOSIS — E1169 Type 2 diabetes mellitus with other specified complication: Secondary | ICD-10-CM

## 2021-02-07 MED ORDER — ONETOUCH DELICA LANCETS 33G MISC: 3 refills | Status: AC

## 2021-02-07 MED ORDER — GLUCOSE BLOOD VI STRP: ORAL_STRIP | 3 refills | Status: DC

## 2021-02-07 MED ORDER — ONETOUCH ULTRA MINI W/DEVICE KIT: PACK | 0 refills | Status: AC

## 2021-02-07 MED ORDER — FLUOXETINE HCL 20 MG PO CAPS: 20.0000 mg | ORAL_CAPSULE | Freq: Every day | ORAL | 6 refills | Status: DC

## 2021-02-07 NOTE — Telephone Encounter (Signed)
E-scribed new rxs.

## 2021-02-18 ENCOUNTER — Other Ambulatory Visit: Payer: Self-pay | Admitting: Family Medicine

## 2021-02-21 ENCOUNTER — Encounter: Payer: Self-pay | Admitting: Family Medicine

## 2021-02-21 NOTE — Telephone Encounter (Signed)
Tizanidine Last filled:  01/26/21, #30 Last OV:  12/12/20, W 2 MCR Next OV:  06/12/21, 6 mo DM f/u

## 2021-02-22 ENCOUNTER — Encounter: Payer: Self-pay | Admitting: Family Medicine

## 2021-02-28 ENCOUNTER — Other Ambulatory Visit: Payer: Self-pay | Admitting: Family Medicine

## 2021-03-01 ENCOUNTER — Other Ambulatory Visit: Payer: Self-pay | Admitting: Family Medicine

## 2021-03-06 ENCOUNTER — Encounter: Payer: Self-pay | Admitting: Family Medicine

## 2021-03-07 ENCOUNTER — Other Ambulatory Visit: Payer: Self-pay

## 2021-03-07 MED ORDER — HYDROCHLOROTHIAZIDE 12.5 MG PO CAPS: 12.5000 mg | ORAL_CAPSULE | Freq: Every day | ORAL | 1 refills | Status: DC

## 2021-03-07 MED ORDER — VALSARTAN 160 MG PO TABS: 160.0000 mg | ORAL_TABLET | Freq: Every day | ORAL | 1 refills | Status: DC

## 2021-04-04 ENCOUNTER — Encounter: Payer: Self-pay | Admitting: Family Medicine

## 2021-04-08 ENCOUNTER — Other Ambulatory Visit: Payer: Self-pay | Admitting: Family Medicine

## 2021-04-17 ENCOUNTER — Other Ambulatory Visit: Payer: Self-pay | Admitting: Family Medicine

## 2021-04-18 NOTE — Telephone Encounter (Signed)
Refill request Tizanidine Last refill 02/21/21 #30/3 Last office visit 12/12/20 Upcoming appointment 06/12/21

## 2021-04-25 ENCOUNTER — Other Ambulatory Visit: Payer: Self-pay | Admitting: Family Medicine

## 2021-04-25 ENCOUNTER — Other Ambulatory Visit (INDEPENDENT_AMBULATORY_CARE_PROVIDER_SITE_OTHER): Payer: Medicare HMO

## 2021-04-25 ENCOUNTER — Encounter: Payer: Self-pay | Admitting: Family Medicine

## 2021-04-25 ENCOUNTER — Other Ambulatory Visit: Payer: Self-pay

## 2021-04-25 ENCOUNTER — Telehealth (INDEPENDENT_AMBULATORY_CARE_PROVIDER_SITE_OTHER): Payer: Medicare HMO | Admitting: Family Medicine

## 2021-04-25 DIAGNOSIS — R059 Cough, unspecified: Secondary | ICD-10-CM

## 2021-04-25 NOTE — Progress Notes (Signed)
   Olivia Benton - 65 y.o. female  MRN [id number]  Date of Birth: 1956/11/06  PCP: Ria Bush, MD  This service was provided via telemedicine. Phone Visit performed on 04/25/2021    Rationale for phone visit along with limitations reviewed. I discussed the limitations, risks, security and privacy concerns of performing a phone visit and the availability of in person appointments. I also discussed with the patient that there may be a patient responsible charge related to this service. Patient consented to telephone encounter.    Location of patient: home Location of provider: in office, [place] [facility] Name of referring provider: N/A   Names of persons and role in encounter: Provider: Ria Bush, MD  Patient: Olivia Benton  Other: N/A   Time on call: 2:20pm - 2:29pm   Subjective: Chief Complaint  Patient presents with  . Cough    C/o cough, runny nose, sneezing, fatigue and HA.  Sxs started yesterday.      HPI:  1d h/o nonproductive cough, hoarseness, HA, fatigue, rhinorrhea.  COVID test at home was negative.  Still sleeping ok.  Treating symptoms with mucinex DM, regular allergy medicines.  No known sick contacts. Attributes to pollen exposure.   No fevers/chills, wheezing, dyspnea, abd pain, nausea, diarrhea, loss of taste or smell, body aches.  Known h/o allergies normally managed with singulair, flonase, xyzal.  No smokers at home.   COVID vaccinated Moderna x2   Objective/Observations:  No physical exam or vital signs collected unless specifically identified below.   Temp 98.7 F (37.1 C)   Ht 4' 9.5" (1.461 m)   Wt 162 lb (73.5 kg)   BMI 34.45 kg/m    Respiratory status: speaks in complete sentences. Deep cough present.   Assessment/Plan:  Cough Cough, congestion, hoarseness, HA, fatigue.  With recent increase in COVID cases locally, I did recommend she come in for COVID PCR.  If normal, would treat as viral URI/allergic  rhinitis flare, consider prednisone taper.  For now continue supportive care measures at home.  Update if persistent or progressive symptoms suggestive of bacterial superinfection (reviewed).    I discussed the assessment and treatment plan with the patient. The patient was provided an opportunity to ask questions and all were answered. The patient agreed with the plan and demonstrated an understanding of the instructions.  Lab Orders  No laboratory test(s) ordered today    No orders of the defined types were placed in this encounter.   The patient was advised to call back or seek an in-person evaluation if the symptoms worsen or if the condition fails to improve as anticipated.  Ria Bush, MD

## 2021-04-25 NOTE — Telephone Encounter (Signed)
Spoke with pt about sxs.  Offered virtual visit now, since Dr. Synthia Innocent 2:00 was canceled.  Pt agreed but needs to do phn visit.  Dr. Darnell Level agreed.

## 2021-04-25 NOTE — Assessment & Plan Note (Signed)
Cough, congestion, hoarseness, HA, fatigue.  With recent increase in COVID cases locally, I did recommend she come in for COVID PCR.  If normal, would treat as viral URI/allergic rhinitis flare, consider prednisone taper.  For now continue supportive care measures at home.  Update if persistent or progressive symptoms suggestive of bacterial superinfection (reviewed).

## 2021-04-26 LAB — NOVEL CORONAVIRUS, NAA: SARS-CoV-2, NAA: DETECTED — AB

## 2021-04-26 LAB — SARS-COV-2, NAA 2 DAY TAT

## 2021-04-26 MED ORDER — NIRMATRELVIR/RITONAVIR (PAXLOVID)TABLET: 3.0000 | ORAL_TABLET | Freq: Two times a day (BID) | ORAL | 0 refills | Status: AC

## 2021-04-26 NOTE — Addendum Note (Signed)
Addended by: Ria Bush on: 04/26/2021 05:03 PM   Modules accepted: Orders

## 2021-04-26 NOTE — Telephone Encounter (Signed)
Please call - covid test returned positive.  She would be eligible for full dose Paxlovid antiviral treatment.  Let me know if interested and I will call her later today to review treatment option (no need for telephone visit)  Lab Results  Component Value Date   CREATININE 0.74 12/05/2020   BUN 16 12/05/2020   NA 141 12/05/2020   K 3.7 12/05/2020   CL 104 12/05/2020   CO2 29 12/05/2020   GFR = 84

## 2021-04-26 NOTE — Telephone Encounter (Signed)
Spoke with patient.  Ongoing cough, fever has since resolved.   Rec: Paxlovid full dose.  Reviewed emergency use authorization Reviewed common side effects - metallic taste, nausea, diarrhea.   Drug Interactions:  lovastatin - hold while on med (she's not been taking) omeprazole - hold while on med

## 2021-04-28 ENCOUNTER — Encounter: Payer: Self-pay | Admitting: Family Medicine

## 2021-04-28 MED ORDER — BENZONATATE 100 MG PO CAPS: 100.0000 mg | ORAL_CAPSULE | Freq: Three times a day (TID) | ORAL | 0 refills | Status: DC | PRN

## 2021-05-01 ENCOUNTER — Other Ambulatory Visit: Payer: Self-pay | Admitting: Family Medicine

## 2021-05-11 DIAGNOSIS — E113393 Type 2 diabetes mellitus with moderate nonproliferative diabetic retinopathy without macular edema, bilateral: Secondary | ICD-10-CM | POA: Diagnosis not present

## 2021-05-11 DIAGNOSIS — H524 Presbyopia: Secondary | ICD-10-CM | POA: Diagnosis not present

## 2021-05-11 DIAGNOSIS — Z0100 Encounter for examination of eyes and vision without abnormal findings: Secondary | ICD-10-CM | POA: Diagnosis not present

## 2021-05-25 ENCOUNTER — Other Ambulatory Visit: Payer: Self-pay | Admitting: Family Medicine

## 2021-05-25 NOTE — Telephone Encounter (Signed)
Tizanidine Last filled:  05/17/21, #30 Last OV:  12/12/20, W 2 MCR Next OV:  06/12/21, 6 mo DM f/u

## 2021-06-12 ENCOUNTER — Encounter: Payer: Self-pay | Admitting: Family Medicine

## 2021-06-12 ENCOUNTER — Other Ambulatory Visit: Payer: Self-pay

## 2021-06-12 ENCOUNTER — Ambulatory Visit (INDEPENDENT_AMBULATORY_CARE_PROVIDER_SITE_OTHER): Payer: Medicare HMO | Admitting: Family Medicine

## 2021-06-12 VITALS — BP 122/70 | HR 84 | Temp 98.0°F | Ht <= 58 in | Wt 158.4 lb

## 2021-06-12 DIAGNOSIS — M7989 Other specified soft tissue disorders: Secondary | ICD-10-CM

## 2021-06-12 DIAGNOSIS — U099 Post covid-19 condition, unspecified: Secondary | ICD-10-CM

## 2021-06-12 DIAGNOSIS — E1169 Type 2 diabetes mellitus with other specified complication: Secondary | ICD-10-CM

## 2021-06-12 DIAGNOSIS — S0081XA Abrasion of other part of head, initial encounter: Secondary | ICD-10-CM | POA: Diagnosis not present

## 2021-06-12 DIAGNOSIS — E669 Obesity, unspecified: Secondary | ICD-10-CM

## 2021-06-12 DIAGNOSIS — E559 Vitamin D deficiency, unspecified: Secondary | ICD-10-CM | POA: Diagnosis not present

## 2021-06-12 DIAGNOSIS — G2581 Restless legs syndrome: Secondary | ICD-10-CM | POA: Diagnosis not present

## 2021-06-12 LAB — POCT GLYCOSYLATED HEMOGLOBIN (HGB A1C): Hemoglobin A1C: 6.6 % — AB (ref 4.0–5.6)

## 2021-06-12 NOTE — Patient Instructions (Addendum)
We will request records from Dr Gloriann Loan (latest diabetic eye exam). Labs today  Continue current medicines  Return as needed or in 6 months for physical Congratulations on weight loss and sugar control!

## 2021-06-12 NOTE — Progress Notes (Signed)
Patient ID: Olivia Benton, female    DOB: Feb 16, 1956, 65 y.o.   MRN: [id number]  This visit was conducted in person.  BP 122/70   Pulse 84   Temp 98 F (36.7 C) (Temporal)   Ht 4' 9.5" (1.461 m)   Wt 158 lb 7 oz (71.9 kg)   SpO2 97%   BMI 33.69 kg/m    CC: 6 mo DM f/u visit  Subjective:   HPI: Olivia Benton is a 65 y.o. female presenting on 06/12/2021 for Diabetes (Here for 6 mo f/u.), Fatigue (C/o no energy, memory loss, leg swelling/pain and insomnia.  Sxs started about 1 mo ago after having COVID. ), and Fall (C/o HA since falling at home about 1 wk ago.  Hit head on side of house. )   COVID infection 04/16/2021. Since then notes fatigue, memory trouble, and insomnia partly attributed to RLS (trouble keeping legs still, worse at night time - this affects sleep). Also notes worsening L>R ankle pain and swelling, more noticeable when laying down (h/o remote DVT age 33yo - with residual swelling off and on since). Continues tizanidine PRN cramping at night.   Had fall 1 wk ago at home - hit L frontal head against side of house. HAs after this, but overall seem to be improving (no HA yesterday or today). Stumbled going up step on the porch. Ibuprofen did help.   Continues working on weight loss - 8 lbs down in the last 6 months! Watching food choices and portion sizes.   Ongoing hearing loss out of left ear - has upcoming ENT appt schedule in 2 wks for cerumen irrigation   DM - does regularly check sugars, 126 this morning. Compliant with antihyperglycemic regimen which includes: metformin 579m bid. Denies low sugars or hypoglycemic symptoms. Denies paresthesias. Last diabetic eye exam (Dr BGloriann Loanlast month). Pneumovax: 2018. Prevnar: not due yet. Glucometer brand: one-touch. DSME: remotely. Lab Results  Component Value Date   HGBA1C 6.6 (A) 06/12/2021   Diabetic Foot Exam - Simple   Simple Foot Form Diabetic Foot exam was performed with the following findings: Yes  06/12/2021  3:09 PM  Visual Inspection No deformities, no ulcerations, no other skin breakdown bilaterally: Yes Sensation Testing Intact to touch and monofilament testing bilaterally: Yes Pulse Check See comments: Yes Comments Diminished pedal pulses    Lab Results  Component Value Date   MICROALBUR <0.7 07/25/2015         Relevant past medical, surgical, family and social history reviewed and updated as indicated. Interim medical history since our last visit reviewed. Allergies and medications reviewed and updated. Outpatient Medications Prior to Visit  Medication Sig Dispense Refill  . Blood Glucose Monitoring Suppl (ONE TOUCH ULTRA MINI) w/Device KIT Use as instructed to check blood sugar once daily. 1 kit 0  . cholecalciferol (VITAMIN D) 1000 UNITS tablet Take 1,000 Units by mouth daily.    .Marland Kitchendesonide (DESOWEN) 0.05 % cream apply to affected area twice a day 30 g 0  . FLUoxetine (PROZAC) 20 MG capsule TAKE 1 CAPSULE BY MOUTH EVERY DAY 90 capsule 0  . fluticasone (FLONASE) 50 MCG/ACT nasal spray USE 2 SPRAYS IN BOTH  NOSTRILS DAILY 48 g 2  . glucose blood (ONE TOUCH ULTRA TEST) test strip Use to check sugar once daily. Dx: E11.9 100 each 3  . hydrochlorothiazide (MICROZIDE) 12.5 MG capsule Take 1 capsule (12.5 mg total) by mouth daily. 90 capsule 1  . levocetirizine (XYZAL) 5  MG tablet Take 5 mg by mouth every evening.    . lovastatin (MEVACOR) 20 MG tablet Take 1 tablet (20 mg total) by mouth once a week. 13 tablet 3  . metFORMIN (GLUCOPHAGE) 500 MG tablet Take 1 tablet (500 mg total) by mouth 2 (two) times daily with a meal. 180 tablet 3  . montelukast (SINGULAIR) 10 MG tablet TAKE 1 TABLET BY MOUTH AT  BEDTIME 90 tablet 3  . Multiple Minerals-Vitamins (CALCIUM & VIT D3 BONE HEALTH PO) Take by mouth daily.    Marland Kitchen omeprazole (PRILOSEC) 40 MG capsule TAKE 1 CAPSULE BY MOUTH  DAILY 90 capsule 3  . OneTouch Delica Lancets 35K MISC Use to check sugar once daily. Dx: E11.9 100 each 3   . tiZANidine (ZANAFLEX) 2 MG tablet TAKE 1 TABLET BY MOUTH TWICE A DAY AS NEEDED FOR MUSCLE SPASMS 30 tablet 3  . valsartan (DIOVAN) 160 MG tablet Take 1 tablet (160 mg total) by mouth daily. 90 tablet 1  . vitamin C (ASCORBIC ACID) 500 MG tablet Take 500 mg by mouth daily.    . benzonatate (TESSALON) 100 MG capsule Take 1 capsule (100 mg total) by mouth 3 (three) times daily as needed for cough. 30 capsule 0   No facility-administered medications prior to visit.     Per HPI unless specifically indicated in ROS section below Review of Systems  Objective:  BP 122/70   Pulse 84   Temp 98 F (36.7 C) (Temporal)   Ht 4' 9.5" (1.461 m)   Wt 158 lb 7 oz (71.9 kg)   SpO2 97%   BMI 33.69 kg/m   Wt Readings from Last 3 Encounters:  06/12/21 158 lb 7 oz (71.9 kg)  04/25/21 162 lb (73.5 kg)  12/12/20 166 lb (75.3 kg)      Physical Exam Vitals and nursing note reviewed.  Constitutional:      Appearance: Normal appearance. She is not ill-appearing.  HENT:     Head:     Comments: Abrasion to L forehead    Right Ear: Tympanic membrane, ear canal and external ear normal.     Left Ear: There is impacted cerumen.  Eyes:     Extraocular Movements: Extraocular movements intact.     Conjunctiva/sclera: Conjunctivae normal.     Pupils: Pupils are equal, round, and reactive to light.  Cardiovascular:     Rate and Rhythm: Normal rate and regular rhythm.     Pulses: Normal pulses.     Heart sounds: Normal heart sounds. No murmur heard. Pulmonary:     Effort: Pulmonary effort is normal. No respiratory distress.     Breath sounds: Normal breath sounds. No wheezing, rhonchi or rales.  Musculoskeletal:     Right lower leg: Edema (tr) present.     Left lower leg: Edema (1+) present.     Comments:  2+ DP bilaterally Noticeable L>R leg swelling  Discomfort to palpation along L lower leg  Skin:    General: Skin is warm and dry.     Findings: No erythema or rash.  Neurological:     Mental  Status: She is alert.  Psychiatric:        Mood and Affect: Mood normal.        Behavior: Behavior normal.      Results for orders placed or performed in visit on 06/12/21  D-dimer, quantitative  Result Value Ref Range   D-Dimer, Quant 0.86 (H) <0.50 mcg/mL FEU  POCT glycosylated hemoglobin (Hb A1C)  Result Value Ref Range   Hemoglobin A1C 6.6 (A) 4.0 - 5.6 %   HbA1c POC (<> result, manual entry)     HbA1c, POC (prediabetic range)     HbA1c, POC (controlled diabetic range)      Assessment & Plan:  This visit occurred during the SARS-CoV-2 public health emergency.  Safety protocols were in place, including screening questions prior to the visit, additional usage of staff PPE, and extensive cleaning of exam room while observing appropriate contact time as indicated for disinfecting solutions.   Problem List Items Addressed This Visit     Diabetes mellitus type 2, controlled (HCC)    Chronic, congratulated on improvement with weight loss. Continue metformin.        Relevant Orders   POCT glycosylated hemoglobin (Hb A1C) (Completed)   Obesity, Class I, BMI 30-34.9    Congratulated on ongoing weight loss.        Vitamin D deficiency    Update vit D levels on 1000 IU daily replacement.       Relevant Orders   VITAMIN D 25 Hydroxy (Vit-D Deficiency, Fractures)   Restless legs syndrome    Ongoing symptoms affecting sleep, ?worse since COVID earlier in the year. Update iron panel  She continues tizanidine PRN.  Consider trial requip/mirapex.        Relevant Orders   Ferritin   IBC panel   Forehead abrasion, initial encounter    Mild residual bruising to L forehead after fall at home, hitting forehead onto side of home. Overall improving without residual symptoms. Not on blood thinner.        Post-COVID syndrome - Primary    Symptomatology consistent with residual post-COVID symptoms (fatigue, mental fogginess). Discussed this as well as recommended vitamin  supplementation. Check labs for reversible causes of fatigue. Discussed anticipated course of recovery.        Relevant Orders   Vitamin B12   Left leg swelling    Endorses chronic L>R ankle/leg swelling although recently worse with associated discomfort. In h/o remote DVT and recent COVID infection, prudent to r/o rpt DVT - will check D dimer and if elevated, proceed with venous US.        Relevant Orders   D-dimer, quantitative (Completed)     No orders of the defined types were placed in this encounter.  Orders Placed This Encounter  Procedures  . Vitamin B12  . Ferritin  . IBC panel  . VITAMIN D 25 Hydroxy (Vit-D Deficiency, Fractures)  . D-dimer, quantitative  . POCT glycosylated hemoglobin (Hb A1C)     Patient Instructions  We will request records from Dr Bell (latest diabetic eye exam). Labs today  Continue current medicines  Return as needed or in 6 months for physical Congratulations on weight loss and sugar control!   Follow up plan: Return if symptoms worsen or fail to improve.  Javier Gutierrez, MD   

## 2021-06-13 ENCOUNTER — Ambulatory Visit
Admission: RE | Admit: 2021-06-13 | Discharge: 2021-06-13 | Disposition: A | Discharge status: Home / Self Care | Payer: Medicare HMO | Source: Ambulatory Visit | Attending: Family Medicine | Admitting: Family Medicine

## 2021-06-13 ENCOUNTER — Other Ambulatory Visit: Payer: Self-pay | Admitting: Family Medicine

## 2021-06-13 DIAGNOSIS — S0081XA Abrasion of other part of head, initial encounter: Secondary | ICD-10-CM | POA: Insufficient documentation

## 2021-06-13 DIAGNOSIS — M7989 Other specified soft tissue disorders: Secondary | ICD-10-CM | POA: Diagnosis not present

## 2021-06-13 DIAGNOSIS — U099 Post covid-19 condition, unspecified: Secondary | ICD-10-CM | POA: Insufficient documentation

## 2021-06-13 DIAGNOSIS — R600 Localized edema: Secondary | ICD-10-CM | POA: Diagnosis not present

## 2021-06-13 DIAGNOSIS — M79605 Pain in left leg: Secondary | ICD-10-CM | POA: Diagnosis not present

## 2021-06-13 LAB — IBC PANEL
Iron: 52 ug/dL (ref 42–145)
Saturation Ratios: 12.3 % — ABNORMAL LOW (ref 20.0–50.0)
Transferrin: 301 mg/dL (ref 212.0–360.0)

## 2021-06-13 LAB — VITAMIN B12: Vitamin B-12: 374 pg/mL (ref 211–911)

## 2021-06-13 LAB — FERRITIN: Ferritin: 58.1 ng/mL (ref 10.0–291.0)

## 2021-06-13 LAB — VITAMIN D 25 HYDROXY (VIT D DEFICIENCY, FRACTURES): VITD: 40.7 ng/mL (ref 30.00–100.00)

## 2021-06-13 NOTE — Assessment & Plan Note (Addendum)
Ongoing symptoms affecting sleep, ?worse since COVID earlier in the year. Update iron panel  She continues tizanidine PRN.  Consider trial requip/mirapex.

## 2021-06-13 NOTE — Assessment & Plan Note (Signed)
Congratulated on ongoing weight loss. 

## 2021-06-13 NOTE — Assessment & Plan Note (Addendum)
Symptomatology consistent with residual post-COVID symptoms (fatigue, mental fogginess). Discussed this as well as recommended vitamin supplementation. Check labs for reversible causes of fatigue. Discussed anticipated course of recovery.

## 2021-06-13 NOTE — Assessment & Plan Note (Signed)
Endorses chronic L>R ankle/leg swelling although recently worse with associated discomfort. In h/o remote DVT and recent COVID infection, prudent to r/o rpt DVT - will check D dimer and if elevated, proceed with venous US.

## 2021-06-13 NOTE — Assessment & Plan Note (Addendum)
Chronic, congratulated on improvement with weight loss. Continue metformin.

## 2021-06-13 NOTE — Assessment & Plan Note (Signed)
Update vit D levels on 1000 IU daily replacement.

## 2021-06-13 NOTE — Assessment & Plan Note (Signed)
Mild residual bruising to L forehead after fall at home, hitting forehead onto side of home. Overall improving without residual symptoms. Not on blood thinner.

## 2021-06-14 ENCOUNTER — Encounter: Payer: Self-pay | Admitting: Family Medicine

## 2021-06-15 NOTE — Telephone Encounter (Signed)
Replied via result note section

## 2021-06-27 ENCOUNTER — Ambulatory Visit (INDEPENDENT_AMBULATORY_CARE_PROVIDER_SITE_OTHER): Payer: Medicare HMO | Admitting: Otolaryngology

## 2021-06-27 ENCOUNTER — Other Ambulatory Visit: Payer: Self-pay

## 2021-06-27 DIAGNOSIS — H6122 Impacted cerumen, left ear: Secondary | ICD-10-CM | POA: Diagnosis not present

## 2021-06-27 NOTE — Progress Notes (Signed)
HPI: Olivia Benton is a 65 y.o. female who presents for evaluation of wax buildup in her ears.  She used to have this routinely cleaned by Dr. Erik Obey about every 6 months until he retired.  Her right ear does well but she has blockage of the left ear again..  Past Medical History:  Diagnosis Date   Asthma    extrinsic   Bilateral carpal tunnel syndrome 11/06/2016   Community acquired pneumonia 12/31/2016   Diabetes mellitus    non-insulin dependent   Foot fracture    History of deep vein thrombosis (DVT) of lower extremity 1974   after back surgery while on OCP   Hyperlipidemia    Hypertension    Rhinitis    Past Surgical History:  Procedure Laterality Date   BACK SURGERY  2010   hardware - Dr. Tonye Becket SURGERY  1974   scoliosis   BREAST BIOPSY Left 09/2011   Dr. Tamala Julian- neg- FIBROCYSTIC CHANGE    TOTAL ABDOMINAL HYSTERECTOMY  1991   endometriosis   Social History   Socioeconomic History   Marital status: Divorced    Spouse name: Not on file   Number of children: Not on file   Years of education: Not on file   Highest education level: Not on file  Occupational History   Not on file  Tobacco Use   Smoking status: Former    Pack years: 0.00    Types: Cigarettes    Quit date: 10/17/2009    Years since quitting: 11.7   Smokeless tobacco: Never  Substance and Sexual Activity   Alcohol use: No   Drug use: No   Sexual activity: Not on file  Other Topics Concern   Not on file  Social History Narrative   Lives alone, no pets   Occ: [employer]   Activity: no regular exercise   Diet: good water, fruits/vegetables daily   Social Determinants of Health   Financial Resource Strain: Not on file  Food Insecurity: Not on file  Transportation Needs: Not on file  Physical Activity: Not on file  Stress: Not on file  Social Connections: Not on file   Family History  Problem Relation Age of Onset   Hypertension Mother    Heart disease Maternal Grandmother         pacemaker   Cancer Maternal Grandfather        liver   Leukemia Paternal Grandmother    Breast cancer Neg Hx    Allergies  Allergen Reactions   Amoxicillin    Biaxin [Clarithromycin]    Gold-Containing Drug Products    Latex    Statins     Crestor, lipitor   Other Hives and Rash    Rubber   Prior to Admission medications   Medication Sig Start Date End Date Taking? Authorizing Provider  Blood Glucose Monitoring Suppl (ONE TOUCH ULTRA MINI) w/Device KIT Use as instructed to check blood sugar once daily. 02/07/21   Ria Bush, MD  cholecalciferol (VITAMIN D) 1000 UNITS tablet Take 1,000 Units by mouth daily.    [provider]  desonide (DESOWEN) 0.05 % cream apply to affected area twice a day 01/08/20   Ria Bush, MD  FLUoxetine (PROZAC) 20 MG capsule TAKE 1 CAPSULE BY MOUTH EVERY DAY 04/10/21   Ria Bush, MD  fluticasone [facility]) 50 MCG/ACT nasal spray USE 2 SPRAYS IN BOTH  NOSTRILS DAILY 05/24/20   Ria Bush, MD  glucose blood (ONE TOUCH ULTRA TEST) test strip  Use to check sugar once daily. Dx: E11.9 02/07/21   Ria Bush, MD  hydrochlorothiazide (MICROZIDE) 12.5 MG capsule Take 1 capsule (12.5 mg total) by mouth daily. 03/07/21   Ria Bush, MD  levocetirizine (XYZAL) 5 MG tablet Take 5 mg by mouth every evening.    [provider]  lovastatin (MEVACOR) 20 MG tablet Take 1 tablet (20 mg total) by mouth once a week. 12/12/20   Ria Bush, MD  metFORMIN (GLUCOPHAGE) 500 MG tablet Take 1 tablet (500 mg total) by mouth 2 (two) times daily with a meal. 12/12/20   Ria Bush, MD  montelukast (SINGULAIR) 10 MG tablet TAKE 1 TABLET BY MOUTH AT  BEDTIME 03/07/20   Ria Bush, MD  Multiple Minerals-Vitamins (CALCIUM & VIT D3 BONE HEALTH PO) Take by mouth daily.    [provider]  omeprazole (PRILOSEC) 40 MG capsule TAKE 1 CAPSULE BY MOUTH  DAILY 03/07/20   Ria Bush, MD  OneTouch Delica Lancets 14D  MISC Use to check sugar once daily. Dx: E11.9 02/07/21   Ria Bush, MD  tiZANidine (ZANAFLEX) 2 MG tablet TAKE 1 TABLET BY MOUTH TWICE A DAY AS NEEDED FOR MUSCLE SPASMS 05/25/21   Ria Bush, MD  valsartan (DIOVAN) 160 MG tablet Take 1 tablet (160 mg total) by mouth daily. 03/07/21   Ria Bush, MD  vitamin C (ASCORBIC ACID) 500 MG tablet Take 500 mg by mouth daily.    [provider]     Positive ROS: Otherwise negative  All other systems have been reviewed and were otherwise negative with the exception of those mentioned in the HPI and as above.  Physical Exam: Constitutional: Alert, well-appearing, no acute distress Ears: External ears without lesions or tenderness. Ear canals on the right side the right ear canal and right TM are clear.  On the left side the medial portion ear canal is completely impacted with cerumen that was cleaned with suction and curettes.  She has a thin layer of desquamated skin over the TM that was cleaned with suction.  The TM was otherwise clear with good mobility pneumatic otoscopy ear canal was otherwise clear with no signs of infection.. Nasal: External nose without lesions. Clear nasal passages Oral: Oropharynx clear. Neck: No palpable adenopathy or masses Respiratory: Breathing comfortably  Skin: No facial/neck lesions or rash noted.  Cerumen impaction removal  Date/Time: 06/27/2021 3:13 PM Performed by: Rozetta Nunnery, MD Authorized by: Rozetta Nunnery, MD   Consent:    Consent obtained:  Verbal   Consent given by:  Patient   Risks discussed:  Pain and bleeding Procedure details:    Location:  L ear   Procedure type: curette and suction   Post-procedure details:    Inspection:  TM intact and canal normal   Hearing quality:  Improved   Procedure completion:  Tolerated well, no immediate complications Comments:     Left ear canal was completely occluded with cerumen that was cleaned with suction and  curettes.  Left TM was clear otherwise.  Assessment: Left ear cerumen impaction  Plan: This was cleaned in the office with improved hearing. She will follow-up as needed.  Radene Journey, MD

## 2021-07-04 ENCOUNTER — Other Ambulatory Visit: Payer: Self-pay | Admitting: Family Medicine

## 2021-07-05 ENCOUNTER — Telehealth: Payer: Self-pay

## 2021-07-05 LAB — D-DIMER, QUANTITATIVE: D-Dimer, Quant: 0.86 mcg/mL FEU — ABNORMAL HIGH (ref <0.50)

## 2021-07-05 NOTE — Telephone Encounter (Signed)
Venous US done 6/28.

## 2021-07-05 NOTE — Telephone Encounter (Signed)
[facility] left v/m on triage line with critical lab results at 4:41 PM.individual calling did not leave a name. Ref # [id number] S. I called [facility] and spoke with Tamika and she said was D dimer result from 06/12/21 of 0.86 that was already in pts lab result notes in chart. When asked why would call 06/12/21 result on 07/05/21 Tamika could not answer that question. Sending note to Dr Darnell Level who is still in office and took note to Dr Darnell Level so he would be aware.

## 2021-08-01 ENCOUNTER — Other Ambulatory Visit: Payer: Self-pay | Admitting: Family Medicine

## 2021-08-13 ENCOUNTER — Other Ambulatory Visit: Payer: Self-pay | Admitting: Family Medicine

## 2021-08-23 ENCOUNTER — Other Ambulatory Visit: Payer: Self-pay | Admitting: Family Medicine

## 2021-08-27 ENCOUNTER — Other Ambulatory Visit: Payer: Self-pay | Admitting: Family Medicine

## 2021-10-08 DIAGNOSIS — J069 Acute upper respiratory infection, unspecified: Secondary | ICD-10-CM | POA: Diagnosis not present

## 2021-10-25 ENCOUNTER — Other Ambulatory Visit: Payer: Self-pay | Admitting: Family Medicine

## 2021-10-25 NOTE — Telephone Encounter (Signed)
Refill request Tizanidine Last refill 08/14/21 #30/3 Last office visit 06/12/21

## 2021-11-10 ENCOUNTER — Other Ambulatory Visit: Payer: Self-pay | Admitting: Family Medicine

## 2021-11-14 ENCOUNTER — Other Ambulatory Visit: Payer: Self-pay | Admitting: Family Medicine

## 2021-11-14 DIAGNOSIS — Z1231 Encounter for screening mammogram for malignant neoplasm of breast: Secondary | ICD-10-CM

## 2021-11-21 ENCOUNTER — Encounter: Payer: Self-pay | Admitting: Family Medicine

## 2021-11-22 NOTE — Telephone Encounter (Signed)
Spoke with [name] asking about phn visit at 12:30 for pt, Olivia Benton.  Says that will work.   I will have Ms. Laughlin added to schedule. Fyi to Dr. Darnell Level.

## 2021-11-24 ENCOUNTER — Encounter: Payer: Self-pay | Admitting: Family Medicine

## 2021-11-26 ENCOUNTER — Other Ambulatory Visit: Payer: Self-pay | Admitting: Family Medicine

## 2021-11-27 DIAGNOSIS — E113393 Type 2 diabetes mellitus with moderate nonproliferative diabetic retinopathy without macular edema, bilateral: Secondary | ICD-10-CM | POA: Diagnosis not present

## 2021-11-28 ENCOUNTER — Other Ambulatory Visit: Payer: Self-pay | Admitting: Family Medicine

## 2021-11-28 ENCOUNTER — Telehealth: Payer: Self-pay

## 2021-11-28 DIAGNOSIS — Z9189 Other specified personal risk factors, not elsewhere classified: Secondary | ICD-10-CM

## 2021-11-28 NOTE — Progress Notes (Signed)
[facility])                                            [place] Team                                        Statin Quality Measure Assessment    11/28/2021  Olivia Benton 06-21-1956 [id number]  Per review of chart and payor information, this patient has been flagged for non-adherence to the following CMS Quality Measure:   [x]  Statin Use in Persons with Diabetes  []  Statin Use in Persons with Cardiovascular Disease  The 10-year ASCVD risk score (Arnett DK, et al., 2019) is: 15.8%   Values used to calculate the score:     Age: 65 years     Sex: Female     Is Non-Hispanic African American: No     Diabetic: Yes     Tobacco smoker: No     Systolic Blood Pressure: 992 mmHg     Is BP treated: Yes     HDL Cholesterol: 24.7 mg/dL     Total Cholesterol: 137 mg/dL     LDL 85 mg/dL as of 12/05/2020  PCP documented statin intolerance last year. Lovastatin 20 mg QW on file yet it has not been filled this year. Last lipid panel on file from December 2021. I called patient but she abruptly hung up the phone while asking her questions about lovastatin fill history. I called her back but was unable to reach; LVM asking to return my call. Next appt w/PCP on 12/12/2021.  If deemed therapeutically appropriate, please consider checking lipid panel to assess need for statin and associate applicable exclusion codes (myopathy G72.0, myosistis M60.9, or abnormal blood glucose R73.09).   Please consider ONE of the following recommendations:   Initiate high intensity statin Atorvastatin 40mg  once daily, #90, 3 refills   Rosuvastatin 20mg  once daily, #90, 3 refills    Initiate moderate intensity          statin with reduced frequency if prior          statin intolerance 1x weekly, #13, 3 refills   2x weekly, #26, 3 refills   3x weekly, #39, 3 refills    Code for past statin intolerance or other exclusions (required annually)   Drug Induced Myopathy G72.0   Myositis, unspecified M60.9   Rhabdomyolysis M62.82   Prediabetes R73.03   Adverse effect of antihyperlipidemic and antiarteriosclerotic drugs, initial encounter E26.8T4H    Thank you for your time,  Kristeen Miss, [place] Cell: [phone]

## 2021-11-29 ENCOUNTER — Encounter: Payer: Self-pay | Admitting: Family Medicine

## 2021-12-02 ENCOUNTER — Other Ambulatory Visit: Payer: Self-pay | Admitting: Family Medicine

## 2021-12-02 DIAGNOSIS — E1169 Type 2 diabetes mellitus with other specified complication: Secondary | ICD-10-CM

## 2021-12-02 DIAGNOSIS — E559 Vitamin D deficiency, unspecified: Secondary | ICD-10-CM

## 2021-12-05 ENCOUNTER — Other Ambulatory Visit: Payer: Medicare HMO

## 2021-12-08 ENCOUNTER — Other Ambulatory Visit: Payer: Self-pay | Admitting: Family Medicine

## 2021-12-12 ENCOUNTER — Encounter: Payer: Medicare HMO | Admitting: Family Medicine

## 2021-12-20 ENCOUNTER — Other Ambulatory Visit: Payer: Self-pay

## 2021-12-20 ENCOUNTER — Ambulatory Visit
Admission: RE | Admit: 2021-12-20 | Discharge: 2021-12-20 | Disposition: A | Discharge status: Home / Self Care | Payer: Medicare HMO | Source: Ambulatory Visit | Attending: Family Medicine | Admitting: Family Medicine

## 2021-12-20 DIAGNOSIS — Z1231 Encounter for screening mammogram for malignant neoplasm of breast: Secondary | ICD-10-CM | POA: Insufficient documentation

## 2021-12-30 ENCOUNTER — Other Ambulatory Visit: Payer: Self-pay | Admitting: Family Medicine

## 2022-01-02 ENCOUNTER — Other Ambulatory Visit: Payer: Self-pay | Admitting: Family Medicine

## 2022-01-02 NOTE — Telephone Encounter (Signed)
Tizanidine Last filled:  12/18/21, #30 Last OV:  06/12/21, 6 mo DM f/u Next OV:  01/09/22, AWV

## 2022-01-09 ENCOUNTER — Ambulatory Visit (INDEPENDENT_AMBULATORY_CARE_PROVIDER_SITE_OTHER): Payer: Medicare HMO | Admitting: Family Medicine

## 2022-01-09 ENCOUNTER — Other Ambulatory Visit: Payer: Self-pay

## 2022-01-09 ENCOUNTER — Encounter: Payer: Self-pay | Admitting: Family Medicine

## 2022-01-09 VITALS — BP 130/72 | HR 65 | Temp 97.7°F | Ht <= 58 in | Wt 156.4 lb

## 2022-01-09 DIAGNOSIS — Z86718 Personal history of other venous thrombosis and embolism: Secondary | ICD-10-CM

## 2022-01-09 DIAGNOSIS — Z87891 Personal history of nicotine dependence: Secondary | ICD-10-CM

## 2022-01-09 DIAGNOSIS — R1032 Left lower quadrant pain: Secondary | ICD-10-CM | POA: Diagnosis not present

## 2022-01-09 DIAGNOSIS — E559 Vitamin D deficiency, unspecified: Secondary | ICD-10-CM | POA: Diagnosis not present

## 2022-01-09 DIAGNOSIS — E1169 Type 2 diabetes mellitus with other specified complication: Secondary | ICD-10-CM

## 2022-01-09 DIAGNOSIS — G6289 Other specified polyneuropathies: Secondary | ICD-10-CM

## 2022-01-09 DIAGNOSIS — Z636 Dependent relative needing care at home: Secondary | ICD-10-CM

## 2022-01-09 DIAGNOSIS — E2839 Other primary ovarian failure: Secondary | ICD-10-CM | POA: Diagnosis not present

## 2022-01-09 DIAGNOSIS — E669 Obesity, unspecified: Secondary | ICD-10-CM

## 2022-01-09 DIAGNOSIS — I10 Essential (primary) hypertension: Secondary | ICD-10-CM | POA: Diagnosis not present

## 2022-01-09 DIAGNOSIS — R69 Illness, unspecified: Secondary | ICD-10-CM | POA: Diagnosis not present

## 2022-01-09 DIAGNOSIS — E66811 Obesity, class 1: Secondary | ICD-10-CM

## 2022-01-09 DIAGNOSIS — K219 Gastro-esophageal reflux disease without esophagitis: Secondary | ICD-10-CM | POA: Diagnosis not present

## 2022-01-09 DIAGNOSIS — Z0000 Encounter for general adult medical examination without abnormal findings: Secondary | ICD-10-CM | POA: Diagnosis not present

## 2022-01-09 DIAGNOSIS — E785 Hyperlipidemia, unspecified: Secondary | ICD-10-CM

## 2022-01-09 DIAGNOSIS — Z23 Encounter for immunization: Secondary | ICD-10-CM

## 2022-01-09 DIAGNOSIS — Z7189 Other specified counseling: Secondary | ICD-10-CM | POA: Diagnosis not present

## 2022-01-09 DIAGNOSIS — G720 Drug-induced myopathy: Secondary | ICD-10-CM

## 2022-01-09 DIAGNOSIS — M7989 Other specified soft tissue disorders: Secondary | ICD-10-CM

## 2022-01-09 LAB — CBC WITH DIFFERENTIAL/PLATELET
Basophils Absolute: 0 10*3/uL (ref 0.0–0.1)
Basophils Relative: 0.5 % (ref 0.0–3.0)
Eosinophils Absolute: 0.3 10*3/uL (ref 0.0–0.7)
Eosinophils Relative: 4.1 % (ref 0.0–5.0)
HCT: 37.8 % (ref 36.0–46.0)
Hemoglobin: 12.5 g/dL (ref 12.0–15.0)
Lymphocytes Relative: 30.2 % (ref 12.0–46.0)
Lymphs Abs: 2.4 10*3/uL (ref 0.7–4.0)
MCHC: 32.9 g/dL (ref 30.0–36.0)
MCV: 86 fl (ref 78.0–100.0)
Monocytes Absolute: 0.6 10*3/uL (ref 0.1–1.0)
Monocytes Relative: 8 % (ref 3.0–12.0)
Neutro Abs: 4.5 10*3/uL (ref 1.4–7.7)
Neutrophils Relative %: 57.2 % (ref 43.0–77.0)
Platelets: 253 10*3/uL (ref 150.0–400.0)
RBC: 4.4 Mil/uL (ref 3.87–5.11)
RDW: 13.8 % (ref 11.5–15.5)
WBC: 7.8 10*3/uL (ref 4.0–10.5)

## 2022-01-09 LAB — COMPREHENSIVE METABOLIC PANEL
ALT: 37 U/L — ABNORMAL HIGH (ref 0–35)
AST: 28 U/L (ref 0–37)
Albumin: 4.5 g/dL (ref 3.5–5.2)
Alkaline Phosphatase: 50 U/L (ref 39–117)
BUN: 17 mg/dL (ref 6–23)
CO2: 31 mEq/L (ref 19–32)
Calcium: 10 mg/dL (ref 8.4–10.5)
Chloride: 99 mEq/L (ref 96–112)
Creatinine, Ser: 0.69 mg/dL (ref 0.40–1.20)
GFR: 90.22 mL/min (ref >60.00)
Glucose, Bld: 120 mg/dL — ABNORMAL HIGH (ref 70–99)
Potassium: 3.5 mEq/L (ref 3.5–5.1)
Sodium: 140 mEq/L (ref 135–145)
Total Bilirubin: 0.5 mg/dL (ref 0.2–1.2)
Total Protein: 7.8 g/dL (ref 6.0–8.3)

## 2022-01-09 LAB — LIPID PANEL
Cholesterol: 146 mg/dL (ref 0–200)
HDL: 24.8 mg/dL — ABNORMAL LOW (ref >39.00)
LDL Cholesterol: 89 mg/dL (ref 0–99)
NonHDL: 121.5
Total CHOL/HDL Ratio: 6
Triglycerides: 164 mg/dL — ABNORMAL HIGH (ref 0.0–149.0)
VLDL: 32.8 mg/dL (ref 0.0–40.0)

## 2022-01-09 LAB — VITAMIN D 25 HYDROXY (VIT D DEFICIENCY, FRACTURES): VITD: 30.91 ng/mL (ref 30.00–100.00)

## 2022-01-09 LAB — HEMOGLOBIN A1C: Hgb A1c MFr Bld: 7.2 % — ABNORMAL HIGH (ref 4.6–6.5)

## 2022-01-09 LAB — VITAMIN B12: Vitamin B-12: 464 pg/mL (ref 211–911)

## 2022-01-09 LAB — LIPASE: Lipase: 27 U/L (ref 11.0–59.0)

## 2022-01-09 MED ORDER — IBGARD 90 MG PO CPCR: 1.0000 | ORAL_CAPSULE | Freq: Three times a day (TID) | ORAL | Status: DC

## 2022-01-09 NOTE — Assessment & Plan Note (Signed)
Preventative protocols reviewed and updated unless pt declined. Discussed healthy diet and lifestyle.  

## 2022-01-09 NOTE — Assessment & Plan Note (Signed)
Chronic on metformin 500mg  bid, update A1c

## 2022-01-09 NOTE — Progress Notes (Addendum)
Patient ID: MARTYNA THORNS, female    DOB: 07-Jun-1956, 66 y.o.   MRN: [id number]  This visit was conducted in person.  BP 130/72    Pulse 65    Temp 97.7 F (36.5 C) (Temporal)    Ht '4\' 10"'  (1.473 m)    Wt 156 lb 7 oz (71 kg)    SpO2 98%    BMI 32.70 kg/m    CC: AMW Subjective:   HPI: ANNALEIA PENCE is a 66 y.o. female presenting on 01/09/2022 for Medicare Wellness   Retired last year from job at [employer]. Now caring for her mother.  Did not see health advisor this year.   Hearing Screening   '500Hz'  '1000Hz'  '2000Hz'  '4000Hz'   Right ear '25 25 20 20  ' Left ear 0 '25 20 25  ' Vision Screening - Comments:: Last eye exam, 11/2021.  [place] Visit from 01/09/2022 in [place] [facility]  PHQ-2 Total Score 2       Fall Risk  01/09/2022 12/12/2020  Falls in the past year? 0 0      Caregiver stress - no noted benefit on wellbutrin 2021. Lst year we switched to prozac 47m with some benefit but interested in trial higher dose.   COVID infection 04/2021 - symptoms largely improved.   Diabetic with statin intolerance (leg cramping, muscle pains) - she was tolerating lovastatin once weekly but stopped months ago - did not refill. Agrees to restart.   Notes epigastric burning discomfort, no radiation to back.  Notes LLQ abdominal pain that stays with her, worse with stressors, worse with certain foods (hamburger). Notes ongoing loose stools 2-3 times daily - this happens every other day. Also gassiness, bloating, indigestion. No fevers/chills, blood in stool, cramping. Symptoms do improve after bowel movement.   Notes worsening bilateral leg pains, cramping, sharp pains, pins/needles, burning pain worse at night.   Preventative: Colon cancer screening - pt states h/o normal colonoscopy with diverticulosis 2014 by Dr STamala Juliansurgery, no records available. rec rpt 10 yrs. Told had diverticulosis. She would want to see Dr BBary CastillaMammo 12[facility]20[facility]  Well  woman exam - pap WNL 2013. S/p complete hysterectomy with BSO 1991 (66yo) for endometriosis. She did have HRT for a year. H/o DVT as teenager when on OCP. Early surgical menopause.  DEXA - DUE.  Lung cancer screening - ~30 PY history. Discussed. Agrees to lung cancer screen.  Flu shot yearly  Covid vaccine - Moderna 02/2020, 03/2020, no  boosters Tetanus shot - declines  Pneumovax - 08/2017  Prevnar 20 - today Shingrix - discussed would be interested  Advanced directive discussion - has not set up. Discussed. HCPOA would be local daughter (Blair PromiseCrupton). Advanced directive packet previously provided.  Seat belt use discussed  Sunscreen use discussed, no changing moles  Ex smoker - quit 2010, prior ~30 PY hx. Children still smoke  Alcohol - none  Dentist - due, needs to find new dentist that takes medicare Eye exam yearly Bowel - no significant constipation  Bladder - no incontinence  Doesn't meet criteria for AAA screen (no fmhx).    Lives alone, no pets Occ: [facility] Activity: no regular exercise - she does have stationary bicycle but hasn't used Diet: good water, fruits/vegetables daily     Relevant past medical, surgical, family and social history reviewed and updated as indicated. Interim medical history since our last visit reviewed. Allergies and medications reviewed and updated. Outpatient Medications Prior to  Visit  Medication Sig Dispense Refill   Blood Glucose Monitoring Suppl (ONE TOUCH ULTRA MINI) w/Device KIT Use as instructed to check blood sugar once daily. 1 kit 0   cholecalciferol (VITAMIN D) 1000 UNITS tablet Take 1,000 Units by mouth daily.     desonide (DESOWEN) 0.05 % cream apply to affected area twice a day 30 g 0   FLUoxetine (PROZAC) 20 MG capsule TAKE 1 CAPSULE BY MOUTH EVERY DAY 90 capsule 0   fluticasone (FLONASE) 50 MCG/ACT nasal spray USE 2 SPRAYS IN BOTH  NOSTRILS DAILY 48 g 2   glucose blood (ONE TOUCH ULTRA TEST) test strip Use to check sugar  once daily. Dx: E11.9 100 each 3   levocetirizine (XYZAL) 5 MG tablet Take 5 mg by mouth every evening.     metFORMIN (GLUCOPHAGE) 500 MG tablet TAKE 1 TABLET BY MOUTH TWICE A DAY WITH MEALS 180 tablet 2   Multiple Minerals-Vitamins (CALCIUM & VIT D3 BONE HEALTH PO) Take by mouth daily.     OneTouch Delica Lancets 11S MISC Use to check sugar once daily. Dx: E11.9 100 each 3   tiZANidine (ZANAFLEX) 2 MG tablet TAKE 1 TABLET BY MOUTH TWICE A DAY AS NEEDED FOR MUSCLE SPASMS 30 tablet 3   vitamin C (ASCORBIC ACID) 500 MG tablet Take 500 mg by mouth daily.     hydrochlorothiazide (MICROZIDE) 12.5 MG capsule TAKE 1 CAPSULE BY MOUTH EVERY DAY 90 capsule 1   lovastatin (MEVACOR) 20 MG tablet Take 1 tablet (20 mg total) by mouth once a week. 13 tablet 3   montelukast (SINGULAIR) 10 MG tablet TAKE 1 TABLET BY MOUTH AT  BEDTIME 90 tablet 3   omeprazole (PRILOSEC) 40 MG capsule TAKE 1 CAPSULE BY MOUTH  DAILY 90 capsule 3   valsartan (DIOVAN) 160 MG tablet TAKE 1 TABLET BY MOUTH EVERY DAY 90 tablet 1   No facility-administered medications prior to visit.     Per HPI unless specifically indicated in ROS section below Review of Systems  Constitutional:  Negative for activity change, appetite change, chills, fatigue, fever and unexpected weight change.  HENT:  Negative for hearing loss.   Eyes:  Negative for visual disturbance.  Respiratory:  Negative for cough, chest tightness, shortness of breath and wheezing.   Cardiovascular:  Negative for chest pain, palpitations and leg swelling.  Gastrointestinal:  Positive for abdominal pain and constipation. Negative for abdominal distention, blood in stool, diarrhea, nausea and vomiting.  Genitourinary:  Negative for difficulty urinating and hematuria.  Musculoskeletal:  Negative for arthralgias, myalgias and neck pain.  Skin:  Negative for rash.  Neurological:  Negative for dizziness, seizures, syncope and headaches.  Hematological:  Negative for  adenopathy. Does not bruise/bleed easily.  Psychiatric/Behavioral:  Negative for dysphoric mood. The patient is not nervous/anxious.    Objective:  BP 130/72    Pulse 65    Temp 97.7 F (36.5 C) (Temporal)    Ht '4\' 10"'  (1.473 m)    Wt 156 lb 7 oz (71 kg)    SpO2 98%    BMI 32.70 kg/m   Wt Readings from Last 3 Encounters:  01/09/22 156 lb 7 oz (71 kg)  06/12/21 158 lb 7 oz (71.9 kg)  04/25/21 162 lb (73.5 kg)      Physical Exam Vitals and nursing note reviewed.  Constitutional:      Appearance: Normal appearance. She is not ill-appearing.  HENT:     Head: Normocephalic and atraumatic.  Right Ear: Tympanic membrane, ear canal and external ear normal. There is no impacted cerumen.     Left Ear: Ear canal and external ear normal. There is impacted cerumen.  Eyes:     General:        Right eye: No discharge.        Left eye: No discharge.     Extraocular Movements: Extraocular movements intact.     Conjunctiva/sclera: Conjunctivae normal.     Pupils: Pupils are equal, round, and reactive to light.  Neck:     Thyroid: No thyroid mass or thyromegaly.     Vascular: No carotid bruit.  Cardiovascular:     Rate and Rhythm: Normal rate and regular rhythm.     Pulses: Normal pulses.     Heart sounds: Normal heart sounds. No murmur heard. Pulmonary:     Effort: Pulmonary effort is normal. No respiratory distress.     Breath sounds: Normal breath sounds. No wheezing, rhonchi or rales.  Abdominal:     General: Bowel sounds are normal. There is no distension.     Palpations: Abdomen is soft. There is no mass.     Tenderness: There is no abdominal tenderness. There is no guarding or rebound.     Hernia: No hernia is present.  Musculoskeletal:     Cervical back: Normal range of motion and neck supple. No rigidity.     Right lower leg: No edema.     Left lower leg: No edema.     Comments: L leg chronically mildly more swollen than on right  Lymphadenopathy:     Cervical: No cervical  adenopathy.  Skin:    General: Skin is warm and dry.     Findings: No erythema or rash.  Neurological:     General: No focal deficit present.     Mental Status: She is alert. Mental status is at baseline.     Comments:  Recall 3/3  Calcuation 5/5 serial 7s  Psychiatric:        Mood and Affect: Mood normal.        Behavior: Behavior normal.      Results for orders placed or performed in visit on 01/09/22  VITAMIN D 25 Hydroxy (Vit-D Deficiency, Fractures)  Result Value Ref Range   VITD 30.91 30.00 - 100.00 ng/mL  Hemoglobin A1c  Result Value Ref Range   Hgb A1c MFr Bld 7.2 (H) 4.6 - 6.5 %  Comprehensive metabolic panel  Result Value Ref Range   Sodium 140 135 - 145 mEq/L   Potassium 3.5 3.5 - 5.1 mEq/L   Chloride 99 96 - 112 mEq/L   CO2 31 19 - 32 mEq/L   Glucose, Bld 120 (H) 70 - 99 mg/dL   BUN 17 6 - 23 mg/dL   Creatinine, Ser 0.69 0.40 - 1.20 mg/dL   Total Bilirubin 0.5 0.2 - 1.2 mg/dL   Alkaline Phosphatase 50 39 - 117 U/L   AST 28 0 - 37 U/L   ALT 37 (H) 0 - 35 U/L   Total Protein 7.8 6.0 - 8.3 g/dL   Albumin 4.5 3.5 - 5.2 g/dL   GFR 90.22 >60.00 mL/min   Calcium 10.0 8.4 - 10.5 mg/dL  Lipid panel  Result Value Ref Range   Cholesterol 146 0 - 200 mg/dL   Triglycerides 164.0 (H) 0.0 - 149.0 mg/dL   HDL 24.80 (L) >39.00 mg/dL   VLDL 32.8 0.0 - 40.0 mg/dL   LDL Cholesterol 89 0 -  99 mg/dL   Total CHOL/HDL Ratio 6    NonHDL 121.50   CBC with Differential/Platelet  Result Value Ref Range   WBC 7.8 4.0 - 10.5 K/uL   RBC 4.40 3.87 - 5.11 Mil/uL   Hemoglobin 12.5 12.0 - 15.0 g/dL   HCT 37.8 36.0 - 46.0 %   MCV 86.0 78.0 - 100.0 fl   MCHC 32.9 30.0 - 36.0 g/dL   RDW 13.8 11.5 - 15.5 %   Platelets 253.0 150.0 - 400.0 K/uL   Neutrophils Relative % 57.2 43.0 - 77.0 %   Lymphocytes Relative 30.2 12.0 - 46.0 %   Monocytes Relative 8.0 3.0 - 12.0 %   Eosinophils Relative 4.1 0.0 - 5.0 %   Basophils Relative 0.5 0.0 - 3.0 %   Neutro Abs 4.5 1.4 - 7.7 K/uL   Lymphs  Abs 2.4 0.7 - 4.0 K/uL   Monocytes Absolute 0.6 0.1 - 1.0 K/uL   Eosinophils Absolute 0.3 0.0 - 0.7 K/uL   Basophils Absolute 0.0 0.0 - 0.1 K/uL  Lipase  Result Value Ref Range   Lipase 27.0 11.0 - 59.0 U/L  Vitamin B12  Result Value Ref Range   Vitamin B-12 464 211 - 911 pg/mL   Depression screen [facility] 2/9 01/09/2022 12/12/2020 03/01/2020 02/10/2019 02/07/2018  Decreased Interest 1 0 0 1 0  Down, Depressed, Hopeless 1 1 0 1 0  PHQ - 2 Score 2 1 0 2 0  Altered sleeping 2 0 1 0 -  Tired, decreased energy 1 0 2 2 -  Change in appetite 0 2 0 1 -  Feeling bad or failure about yourself  0 0 0 0 -  Trouble concentrating 0 0 0 0 -  Moving slowly or fidgety/restless 0 0 0 0 -  Suicidal thoughts 0 0 0 0 -  PHQ-9 Score '5 3 3 5 ' -    GAD 7 : Generalized Anxiety Score 01/09/2022 12/12/2020 03/01/2020 02/10/2019  Nervous, Anxious, on Edge 2 1 0 2  Control/stop worrying '1 1 1 1  ' Worry too much - different things '1 1 1 2  ' Trouble relaxing '2 1 2 3  ' Restless 2 0 0 1  Easily annoyed or irritable 0 2 0 1  Afraid - awful might happen 0 1 0 0  Total GAD 7 Score '8 7 4 10   ' Assessment & Plan:  This visit occurred during the SARS-CoV-2 public health emergency.  Safety protocols were in place, including screening questions prior to the visit, additional usage of staff PPE, and extensive cleaning of exam room while observing appropriate contact time as indicated for disinfecting solutions.   Problem List Items Addressed This Visit     Routine general medical examination at a health care facility (Chronic)    Preventative protocols reviewed and updated unless pt declined. Discussed healthy diet and lifestyle.       Advanced care planning/counseling discussion (Chronic)    Advanced directive discussion - has not set up. Discussed. HCPOA would be local daughter [name]). Advanced directive packet previously provided.       Medicare annual wellness visit, initial - Primary (Chronic)    I have  personally reviewed the Medicare Annual Wellness questionnaire and have noted 1. The patient's medical and social history 2. Their use of alcohol, tobacco or illicit drugs 3. Their current medications and supplements 4. The patient's functional ability including ADL's, fall risks, home safety risks and hearing or visual impairment. Cognitive function has been assessed and  addressed as indicated.  5. Diet and physical activity 6. Evidence for depression or mood disorders The patients weight, height, BMI have been recorded in the chart. I have made referrals, counseling and provided education to the patient based on review of the above and I have provided the pt with a written personalized care plan for preventive services. Provider list updated.. See scanned questionairre as needed for further documentation. Reviewed preventative protocols and updated unless pt declined.       Diabetes mellitus type 2, controlled ([place])    Chronic on metformin 559m bid, update A1c      Relevant Medications   lovastatin (MEVACOR) 20 MG tablet   valsartan (DIOVAN) 160 MG tablet   Hypertension    Chronic, stable on hctz and valsartan.       Relevant Medications   hydrochlorothiazide (MICROZIDE) 12.5 MG capsule   lovastatin (MEVACOR) 20 MG tablet   valsartan (DIOVAN) 160 MG tablet   GERD (gastroesophageal reflux disease)    Breakthrough symptoms despite daily PPI.  Check labs today, consider eval by GI.       Relevant Medications   omeprazole (PRILOSEC) 40 MG capsule   Obesity, Class I, BMI 30-34.9    Encouraged healthy diet and lifestyle choices to affect sustainable weight loss.       LLQ abdominal pain    New, chronic, ?IBS vs diverticulitis. Check labwork including CBC, CMP, lipase. Consider imaging if worsening symptoms. rec trial IBGard for possible IBS symptoms.       Relevant Orders   CBC with Differential/Platelet (Completed)   Lipase (Completed)   Caregiver stress    Trial higher  prozac dose 436mdaily - update with effect and if helping will send in 4052mose.       Dyslipidemia associated with type 2 diabetes mellitus (HCCFelsenthal  Agrees to restart weekly lovastatin which she was tolerating well.  The 10-year ASCVD risk score (Arnett DK, et al., 2019) is: 18.2%   Values used to calculate the score:     Age: [age]ars     Sex: Female     Is Non-Hispanic African American: No     Diabetic: Yes     Tobacco smoker: No     Systolic Blood Pressure: 130096Hg     Is BP treated: Yes     HDL Cholesterol: 24.8 mg/dL     Total Cholesterol: 146 mg/dL       Relevant Medications   lovastatin (MEVACOR) 20 MG tablet   valsartan (DIOVAN) 160 MG tablet   Vitamin D deficiency    Update levels on 1000 IU daily.       Drug-induced myopathy    Statin intolerance - worsened myalgias.  Has tolerated once weekly lovastatin well.       Left leg swelling    Mild, chronic.       Peripheral neuropathy    Check vitamins for reversible causes.       Relevant Orders   Vitamin B1   Vitamin B12 (Completed)   Ex-smoker    Refer for lung cancer screening.       Relevant Orders   Ambulatory Referral for Lung Cancer Scre   History of deep vein thrombosis (DVT) of lower extremity   Other Visit Diagnoses     Need for vaccination against Streptococcus pneumoniae       Relevant Orders   Pneumococcal conjugate vaccine 20-valent (Completed)   Estrogen deficiency       Relevant  Orders   DG Bone Density        Meds ordered this encounter  Medications   Peppermint Oil (IBGARD) 90 MG CPCR    Sig: Take 1 capsule by mouth 3 (three) times daily before meals.   hydrochlorothiazide (MICROZIDE) 12.5 MG capsule    Sig: TAKE 1 CAPSULE BY MOUTH EVERY DAY    Dispense:  90 capsule    Refill:  3   lovastatin (MEVACOR) 20 MG tablet    Sig: Take 1 tablet (20 mg total) by mouth once a week.    Dispense:  13 tablet    Refill:  3   montelukast (SINGULAIR) 10 MG tablet    Sig: Take 1  tablet (10 mg total) by mouth at bedtime.    Dispense:  90 tablet    Refill:  3   omeprazole (PRILOSEC) 40 MG capsule    Sig: Take 1 capsule (40 mg total) by mouth daily.    Dispense:  90 capsule    Refill:  3   valsartan (DIOVAN) 160 MG tablet    Sig: Take 1 tablet (160 mg total) by mouth daily.    Dispense:  90 tablet    Refill:  3   Orders Placed This Encounter  Procedures   DG Bone Density    Standing Status:   Future    Standing Expiration Date:   01/09/2023    Order Specific Question:   Reason for Exam (SYMPTOM  OR DIAGNOSIS REQUIRED)    Answer:   osteoporosis screen    Order Specific Question:   Preferred imaging location?    Answer:   [facility]   Pneumococcal conjugate vaccine 20-valent   CBC with Differential/Platelet   Lipase   Vitamin B1   Vitamin B12   Ambulatory Referral for Lung Cancer Scre    Referral Priority:   Routine    Referral Type:   Consultation    Referral Reason:   Specialty Services Required    Number of Visits Requested:   1     Patient instructions: [id number] today (pneumonia shot) Labs today  If interested, check with pharmacy about new 2 shot shingles series (shingrix).  Restart lovastatin 30m weekly. We should recheck cholesterol levels 3-4 months after restarting.  [phone] will refer you to the lung cancer screening program.  Work on setting up advanced directive.  Trial higher prozac dose 462mdaily, update me with effect after 3 weeks and if doing well we will send in higher dose to pharmacy.  For possible irritable bowel syndrome - try over the counter IB guard with meals - you can find this over the counter. Update me with effect Return in 6 months for follow up visit, sooner if needed  Follow up plan: Return in about 6 months (around 07/09/2022), or if symptoms worsen or fail to improve, for follow up visit.  JaRia BushMD

## 2022-01-09 NOTE — Assessment & Plan Note (Signed)
Advanced directive discussion - has not set up. Discussed. HCPOA would be local daughter Olivia Benton). Advanced directive packet previously provided.

## 2022-01-09 NOTE — Patient Instructions (Addendum)
[id number] today (pneumonia shot) Labs today  If interested, check with pharmacy about new 2 shot shingles series (shingrix).  Restart lovastatin 20mg  weekly. We should recheck cholesterol levels 3-4 months after restarting.  Call to schedule bone density scan at your convenience: [facility] [phone] We will refer you to the lung cancer screening program.  Work on setting up advanced directive.  Trial higher prozac dose 40mg  daily, update me with effect after 3 weeks and if doing well we will send in higher dose to pharmacy.  For possible irritable bowel syndrome - try over the counter IB guard with meals - you can find this over the counter. Update me with effect Return in 6 months for follow up visit, sooner if needed  Health Maintenance After Age 67 After age 36, you are at a higher risk for certain long-term diseases and infections as well as injuries from falls. Falls are a major cause of broken bones and head injuries in people who are older than age 72. Getting regular preventive care can help to keep you healthy and well. Preventive care includes getting regular testing and making lifestyle changes as recommended by your health care provider. Talk with your health care provider about: Which screenings and tests you should have. A screening is a test that checks for a disease when you have no symptoms. A diet and exercise plan that is right for you. What should I know about screenings and tests to prevent falls? Screening and testing are the best ways to find a health problem early. Early diagnosis and treatment give you the best chance of managing medical conditions that are common after age 65. Certain conditions and lifestyle choices may make you more likely to have a fall. Your health care provider may recommend: Regular vision checks. Poor vision and conditions such as cataracts can make you more likely to have a fall. If you wear glasses, make sure to get your  prescription updated if your vision changes. Medicine review. Work with your health care provider to regularly review all of the medicines you are taking, including over-the-counter medicines. Ask your health care provider about any side effects that may make you more likely to have a fall. Tell your health care provider if any medicines that you take make you feel dizzy or sleepy. Strength and balance checks. Your health care provider may recommend certain tests to check your strength and balance while standing, walking, or changing positions. Foot health exam. Foot pain and numbness, as well as not wearing proper footwear, can make you more likely to have a fall. Screenings, including: Osteoporosis screening. Osteoporosis is a condition that causes the bones to get weaker and break more easily. Blood pressure screening. Blood pressure changes and medicines to control blood pressure can make you feel dizzy. Depression screening. You may be more likely to have a fall if you have a fear of falling, feel depressed, or feel unable to do activities that you used to do. Alcohol use screening. Using too much alcohol can affect your balance and may make you more likely to have a fall. Follow these instructions at home: Lifestyle Do not drink alcohol if: Your health care provider tells you not to drink. If you drink alcohol: Limit how much you have to: 0-1 drink a day for women. 0-2 drinks a day for men. Know how much alcohol is in your drink. In the [place] one drink equals one 12 oz bottle of beer (355 mL), one  5 oz glass of wine (148 mL), or one 1 oz glass of hard liquor (44 mL). Do not use any products that contain nicotine or tobacco. These products include cigarettes, chewing tobacco, and vaping devices, such as e-cigarettes. If you need help quitting, ask your health care provider. Activity  Follow a regular exercise program to stay fit. This will help you maintain your balance. Ask your health  care provider what types of exercise are appropriate for you. If you need a cane or walker, use it as recommended by your health care provider. Wear supportive shoes that have nonskid soles. Safety  Remove any tripping hazards, such as rugs, cords, and clutter. Install safety equipment such as grab bars in bathrooms and safety rails on stairs. Keep rooms and walkways well-lit. General instructions Talk with your health care provider about your risks for falling. Tell your health care provider if: You fall. Be sure to tell your health care provider about all falls, even ones that seem minor. You feel dizzy, tiredness (fatigue), or off-balance. Take over-the-counter and prescription medicines only as told by your health care provider. These include supplements. Eat a healthy diet and maintain a healthy weight. A healthy diet includes low-fat dairy products, low-fat (lean) meats, and fiber from whole grains, beans, and lots of fruits and vegetables. Stay current with your vaccines. Schedule regular health, dental, and eye exams. Summary Having a healthy lifestyle and getting preventive care can help to protect your health and wellness after age 61. Screening and testing are the best way to find a health problem early and help you avoid having a fall. Early diagnosis and treatment give you the best chance for managing medical conditions that are more common for people who are older than age 2. Falls are a major cause of broken bones and head injuries in people who are older than age 62. Take precautions to prevent a fall at home. Work with your health care provider to learn what changes you can make to improve your health and wellness and to prevent falls. This information is not intended to replace advice given to you by your health care provider. Make sure you discuss any questions you have with your health care provider. Document Revised: 04/24/2021 Document Reviewed: 04/24/2021 Elsevier  Patient Education  [place].

## 2022-01-09 NOTE — Assessment & Plan Note (Signed)

## 2022-01-10 DIAGNOSIS — Z87891 Personal history of nicotine dependence: Secondary | ICD-10-CM | POA: Insufficient documentation

## 2022-01-10 DIAGNOSIS — G629 Polyneuropathy, unspecified: Secondary | ICD-10-CM | POA: Insufficient documentation

## 2022-01-10 DIAGNOSIS — Z86718 Personal history of other venous thrombosis and embolism: Secondary | ICD-10-CM | POA: Insufficient documentation

## 2022-01-10 MED ORDER — VALSARTAN 160 MG PO TABS: 160.0000 mg | ORAL_TABLET | Freq: Every day | ORAL | 3 refills | Status: DC

## 2022-01-10 MED ORDER — LOVASTATIN 20 MG PO TABS: 20.0000 mg | ORAL_TABLET | ORAL | 3 refills | Status: DC

## 2022-01-10 MED ORDER — OMEPRAZOLE 40 MG PO CPDR: 40.0000 mg | DELAYED_RELEASE_CAPSULE | Freq: Every day | ORAL | 3 refills | Status: DC

## 2022-01-10 MED ORDER — HYDROCHLOROTHIAZIDE 12.5 MG PO CAPS: ORAL_CAPSULE | ORAL | 3 refills | Status: DC

## 2022-01-10 MED ORDER — MONTELUKAST SODIUM 10 MG PO TABS: 10.0000 mg | ORAL_TABLET | Freq: Every day | ORAL | 3 refills | Status: DC

## 2022-01-10 NOTE — Assessment & Plan Note (Signed)
Update levels on 1000 IU daily.  

## 2022-01-10 NOTE — Assessment & Plan Note (Addendum)
Statin intolerance - worsened myalgias.  Has tolerated once weekly lovastatin well.

## 2022-01-10 NOTE — Assessment & Plan Note (Signed)
Encouraged healthy diet and lifestyle choices to affect sustainable weight loss.  ?

## 2022-01-10 NOTE — Assessment & Plan Note (Signed)
Agrees to restart weekly lovastatin which she was tolerating well.  The 10-year ASCVD risk score (Arnett DK, et al., 2019) is: 18.2%   Values used to calculate the score:     Age: 66 years     Sex: Female     Is Non-Hispanic African American: No     Diabetic: Yes     Tobacco smoker: No     Systolic Blood Pressure: 429 mmHg     Is BP treated: Yes     HDL Cholesterol: 24.8 mg/dL     Total Cholesterol: 146 mg/dL

## 2022-01-10 NOTE — Assessment & Plan Note (Signed)
Trial higher prozac dose 40mg  daily - update with effect and if helping will send in 40mg  dose.

## 2022-01-10 NOTE — Assessment & Plan Note (Addendum)
New, chronic, ?IBS vs diverticulitis. Check labwork including CBC, CMP, lipase. Consider imaging if worsening symptoms. rec trial IBGard for possible IBS symptoms.

## 2022-01-10 NOTE — Assessment & Plan Note (Signed)
Chronic, stable on hctz and valsartan.

## 2022-01-10 NOTE — Assessment & Plan Note (Signed)
Refer for lung cancer screening 

## 2022-01-10 NOTE — Assessment & Plan Note (Signed)
Mild, chronic.

## 2022-01-10 NOTE — Assessment & Plan Note (Addendum)
Breakthrough symptoms despite daily PPI.  Check labs today, consider eval by GI.

## 2022-01-10 NOTE — Assessment & Plan Note (Signed)
Check vitamins for reversible causes.

## 2022-01-11 ENCOUNTER — Other Ambulatory Visit: Payer: Self-pay | Admitting: Family Medicine

## 2022-01-11 MED ORDER — VITAMIN D 50 MCG (2000 UT) PO CAPS: 1.0000 | ORAL_CAPSULE | Freq: Every day | ORAL | Status: AC

## 2022-01-12 ENCOUNTER — Other Ambulatory Visit: Payer: Self-pay | Admitting: Family Medicine

## 2022-01-13 LAB — VITAMIN B1: Vitamin B1 (Thiamine): 13 nmol/L (ref 8–30)

## 2022-02-21 ENCOUNTER — Other Ambulatory Visit: Payer: Self-pay | Admitting: Family Medicine

## 2022-02-21 NOTE — Telephone Encounter (Signed)
Refill request tizanidine ?Last refill 01/03/22 #30/3 ?Last office visit 01/09/22 ?

## 2022-03-05 ENCOUNTER — Other Ambulatory Visit: Payer: Self-pay

## 2022-03-05 DIAGNOSIS — Z87891 Personal history of nicotine dependence: Secondary | ICD-10-CM

## 2022-03-12 ENCOUNTER — Other Ambulatory Visit: Payer: Self-pay | Admitting: Family Medicine

## 2022-03-20 ENCOUNTER — Encounter: Payer: Self-pay | Admitting: Acute Care

## 2022-03-20 ENCOUNTER — Ambulatory Visit (INDEPENDENT_AMBULATORY_CARE_PROVIDER_SITE_OTHER): Payer: Medicare HMO | Admitting: Acute Care

## 2022-03-20 DIAGNOSIS — Z87891 Personal history of nicotine dependence: Secondary | ICD-10-CM

## 2022-03-20 NOTE — Progress Notes (Signed)
Virtual Visit via Telephone Note ? ?I connected with Olivia Benton on 10/31/21 at  2:00 PM EST by telephone and verified that I am speaking with the correct person using two identifiers. ? ?Location: ?Patient: Home ?Provider: Working from home ?  ?I discussed the limitations, risks, security and privacy concerns of performing an evaluation and management service by telephone and the availability of in person appointments. I also discussed with the patient that there may be a patient responsible charge related to this service. The patient expressed understanding and agreed to proceed. ? ?Shared Decision Making Visit Lung Cancer Screening Program ?([phone]) ? ? ?Eligibility: ?Age 66 y.o. ?Pack Years Smoking History Calculation 44 ?(# packs/per year x # years smoked) ?Recent History of coughing up blood  no ?Unexplained weight loss? no ?( >Than 15 pounds within the last 6 months ) ?Prior History Lung / other cancer no ?(Diagnosis within the last 5 years already requiring surveillance chest CT Scans). ?Smoking Status Former Smoker ?Former Smokers: Years since quit: 13 years ? Quit Date: 2010 ? ?Visit Components: ?Discussion included one or more decision making aids. yes ?Discussion included risk/benefits of screening. yes ?Discussion included potential follow up diagnostic testing for abnormal scans. yes ?Discussion included meaning and risk of over diagnosis. yes ?Discussion included meaning and risk of False Positives. yes ?Discussion included meaning of total radiation exposure. yes ? ?Counseling Included: ?Importance of adherence to annual lung cancer LDCT screening. yes ?Impact of comorbidities on ability to participate in the program. yes ?Ability and willingness to under diagnostic treatment. yes ? ?Smoking Cessation Counseling: ?Current Smokers:  ?Discussed importance of smoking cessation. yes ?Information about tobacco cessation classes and interventions provided to patient. yes ?Patient provided with  "ticket" for LDCT Scan. yes ?Symptomatic Patient. no ? Counseling NA ?Diagnosis Code: Tobacco Use Z72.0 ?Asymptomatic Patient yes ? Counseling NA ?Former Smokers:  ?Discussed the importance of maintaining cigarette abstinence. yes ?Diagnosis Code: Personal History of Nicotine Dependence. N46.270 ?Information about tobacco cessation classes and interventions provided to patient. Yes ?Patient provided with "ticket" for LDCT Scan. yes ?Written Order for Lung Cancer Screening with LDCT placed in Epic. Yes ?(CT Chest Lung Cancer Screening Low Dose W/O CM) [id number] ?Z12.2-Screening of respiratory organs ?Z87.891-Personal history of nicotine dependence ? ? ?I spent 25 minutes of face to face time with her discussing the risks and benefits of lung cancer screening. We viewed a power point together that explained in detail the above noted topics. We took the time to pause the power point at intervals to allow for questions to be asked and answered to ensure understanding. We discussed that she had taken the single most powerful action possible to decrease her risk of developing lung cancer when he quit smoking. I counseled her to remain smoke free, and to contact me if he ever had the desire to smoke again so that I can provide resources and tools to help support the effort to remain smoke free. We discussed the time and location of the scan, and that either  Doroteo Glassman RN or I will call with the results within  24-48 hours of receiving them. She has my card and contact information in the event she needs to speak with me, in addition to a copy of the power point we reviewed as a resource. She verbalized understanding of all of the above and had no further questions upon leaving the office.  ? ? ? ?I explained to the patient that there has been a high incidence of  coronary artery disease noted on these exams. I explained that this is a non-gated exam therefore degree or severity cannot be determined. This patient is not on  statin therapy. I have asked the patient to follow-up with their PCP regarding any incidental finding of coronary artery disease and management with diet or medication as they feel is clinically indicated. The patient verbalized understanding of the above and had no further questions. ? ? ? ?Olivia Kassim D. Harris, NP-C ?[facility] ?Personal contact information can be found on Amion  ?03/20/2022, 9:39 AM ? ? ? ? ? ? ? ? ? ?

## 2022-03-20 NOTE — Patient Instructions (Signed)
Thank you for participating in the [facility]. It was our pleasure to meet you today. We will call you with the results of your scan within the next few days. Your scan will be assigned a Lung RADS category score by the physicians reading the scans.  This Lung RADS score determines follow up scanning.  See below for description of categories, and follow up screening recommendations. We will be in touch to schedule your follow up screening annually or based on recommendations of our providers. We will fax a copy of your scan results to your Primary Care Physician, or the physician who referred you to the program, to ensure they have the results. Please call the office if you have any questions or concerns regarding your scanning experience or results.  Our office number is [phone]. Please speak with Denise Phelps, RN. , or  Denise Buckner RN, They are  our Lung Cancer Screening RN.'s If They are unavailable when you call, Please leave a message on the voice mail. We will return your call at our earliest convenience.This voice mail is monitored several times a day.  Remember, if your scan is normal, we will scan you annually as long as you continue to meet the criteria for the program. (Age 55-77, Current smoker or smoker who has quit within the last 15 years). If you are a smoker, remember, quitting is the single most powerful action that you can take to decrease your risk of lung cancer and other pulmonary, breathing related problems. We know quitting is hard, and we are here to help.  Please let us know if there is anything we can do to help you meet your goal of quitting. If you are a former smoker, congratulations. We are proud of you! Remain smoke free! Remember you can refer friends or family members through the number above.  We will screen them to make sure they meet criteria for the program. Thank you for helping us take better care of you by  participating in Lung Screening.  You can receive free nicotine replacement therapy ( patches, gum or mints) by calling 1-800-QUIT NOW. Please call so we can get you on the path to becoming  a non-smoker. I know it is hard, but you can do this!  Lung RADS Categories:  Lung RADS 1: no nodules or definitely non-concerning nodules.  Recommendation is for a repeat annual scan in 12 months.  Lung RADS 2:  nodules that are non-concerning in appearance and behavior with a very low likelihood of becoming an active cancer. Recommendation is for a repeat annual scan in 12 months.  Lung RADS 3: nodules that are probably non-concerning , includes nodules with a low likelihood of becoming an active cancer.  Recommendation is for a 6-month repeat screening scan. Often noted after an upper respiratory illness. We will be in touch to make sure you have no questions, and to schedule your 6-month scan.  Lung RADS 4 A: nodules with concerning findings, recommendation is most often for a follow up scan in 3 months or additional testing based on our provider's assessment of the scan. We will be in touch to make sure you have no questions and to schedule the recommended 3 month follow up scan.  Lung RADS 4 B:  indicates findings that are concerning. We will be in touch with you to schedule additional diagnostic testing based on our provider's  assessment of the scan.  Other options for assistance in smoking cessation (   As covered by your insurance benefits)  Hypnosis for smoking cessation  [employer] [phone]  Acupuncture for smoking cessation  [facility] [phone]   

## 2022-03-21 ENCOUNTER — Ambulatory Visit
Admission: RE | Admit: 2022-03-21 | Discharge: 2022-03-21 | Disposition: A | Discharge status: Home / Self Care | Payer: Medicare HMO | Source: Ambulatory Visit | Attending: Acute Care | Admitting: Acute Care

## 2022-03-21 DIAGNOSIS — Z87891 Personal history of nicotine dependence: Secondary | ICD-10-CM | POA: Diagnosis not present

## 2022-03-21 DIAGNOSIS — J432 Centrilobular emphysema: Secondary | ICD-10-CM | POA: Diagnosis not present

## 2022-03-21 DIAGNOSIS — R918 Other nonspecific abnormal finding of lung field: Secondary | ICD-10-CM | POA: Diagnosis not present

## 2022-03-21 DIAGNOSIS — I2510 Atherosclerotic heart disease of native coronary artery without angina pectoris: Secondary | ICD-10-CM | POA: Diagnosis not present

## 2022-03-22 ENCOUNTER — Other Ambulatory Visit: Payer: Self-pay | Admitting: Acute Care

## 2022-03-22 DIAGNOSIS — Z122 Encounter for screening for malignant neoplasm of respiratory organs: Secondary | ICD-10-CM

## 2022-03-22 DIAGNOSIS — Z87891 Personal history of nicotine dependence: Secondary | ICD-10-CM

## 2022-03-31 ENCOUNTER — Other Ambulatory Visit: Payer: Self-pay | Admitting: Family Medicine

## 2022-04-02 ENCOUNTER — Other Ambulatory Visit: Payer: Self-pay | Admitting: Family Medicine

## 2022-04-02 NOTE — Telephone Encounter (Signed)
Refill request Tizanidine ?Last refill 02/21/22 #30/3 ?Last office visit 01/09/22 ?Upcoming appointment 07/11/22 ?

## 2022-04-04 ENCOUNTER — Encounter: Payer: Self-pay | Admitting: Family Medicine

## 2022-04-07 ENCOUNTER — Other Ambulatory Visit: Payer: Self-pay | Admitting: Family Medicine

## 2022-04-07 DIAGNOSIS — G2581 Restless legs syndrome: Secondary | ICD-10-CM

## 2022-04-07 DIAGNOSIS — G720 Drug-induced myopathy: Secondary | ICD-10-CM

## 2022-04-07 DIAGNOSIS — E1169 Type 2 diabetes mellitus with other specified complication: Secondary | ICD-10-CM

## 2022-04-07 MED ORDER — GABAPENTIN 100 MG PO CAPS
100.0000 mg | ORAL_CAPSULE | Freq: Every day | ORAL | 0 refills | Status: DC
Start: 2022-04-07 — End: 2022-05-02

## 2022-04-07 MED ORDER — IRON (FERROUS SULFATE) 325 (65 FE) MG PO TABS: 325.0000 mg | ORAL_TABLET | ORAL | Status: DC

## 2022-04-07 NOTE — Addendum Note (Signed)
Addended by: Ria Bush on: 04/07/2022 12:42 PM ? ? Modules accepted: Orders ? ?

## 2022-04-07 NOTE — Addendum Note (Signed)
Addended by: Ria Bush on: 04/07/2022 12:51 PM ? ? Modules accepted: Orders ? ?

## 2022-04-10 ENCOUNTER — Other Ambulatory Visit (INDEPENDENT_AMBULATORY_CARE_PROVIDER_SITE_OTHER): Payer: Medicare HMO

## 2022-04-10 DIAGNOSIS — E1169 Type 2 diabetes mellitus with other specified complication: Secondary | ICD-10-CM

## 2022-04-10 DIAGNOSIS — G720 Drug-induced myopathy: Secondary | ICD-10-CM | POA: Diagnosis not present

## 2022-04-10 DIAGNOSIS — E785 Hyperlipidemia, unspecified: Secondary | ICD-10-CM

## 2022-04-10 DIAGNOSIS — G2581 Restless legs syndrome: Secondary | ICD-10-CM | POA: Diagnosis not present

## 2022-04-10 LAB — HEPATIC FUNCTION PANEL
ALT: 40 U/L — ABNORMAL HIGH (ref 0–35)
AST: 32 U/L (ref 0–37)
Albumin: 4.3 g/dL (ref 3.5–5.2)
Alkaline Phosphatase: 54 U/L (ref 39–117)
Bilirubin, Direct: 0.1 mg/dL (ref 0.0–0.3)
Total Bilirubin: 0.5 mg/dL (ref 0.2–1.2)
Total Protein: 7.1 g/dL (ref 6.0–8.3)

## 2022-04-10 LAB — LIPID PANEL
Cholesterol: 146 mg/dL (ref 0–200)
HDL: 27.1 mg/dL — ABNORMAL LOW (ref >39.00)
LDL Cholesterol: 83 mg/dL (ref 0–99)
NonHDL: 118.75
Total CHOL/HDL Ratio: 5
Triglycerides: 178 mg/dL — ABNORMAL HIGH (ref 0.0–149.0)
VLDL: 35.6 mg/dL (ref 0.0–40.0)

## 2022-04-10 LAB — IBC PANEL
Iron: 47 ug/dL (ref 42–145)
Saturation Ratios: 13.1 % — ABNORMAL LOW (ref 20.0–50.0)
TIBC: 359.8 ug/dL (ref 250.0–450.0)
Transferrin: 257 mg/dL (ref 212.0–360.0)

## 2022-04-10 LAB — BASIC METABOLIC PANEL
BUN: 13 mg/dL (ref 6–23)
CO2: 32 mEq/L (ref 19–32)
Calcium: 9.5 mg/dL (ref 8.4–10.5)
Chloride: 98 mEq/L (ref 96–112)
Creatinine, Ser: 0.69 mg/dL (ref 0.40–1.20)
GFR: 90.06 mL/min (ref >60.00)
Glucose, Bld: 120 mg/dL — ABNORMAL HIGH (ref 70–99)
Potassium: 4 mEq/L (ref 3.5–5.1)
Sodium: 138 mEq/L (ref 135–145)

## 2022-04-10 LAB — MICROALBUMIN / CREATININE URINE RATIO
Creatinine,U: 173.9 mg/dL
Microalb Creat Ratio: 2.3 mg/g (ref 0.0–30.0)
Microalb, Ur: 3.9 mg/dL — ABNORMAL HIGH (ref 0.0–1.9)

## 2022-04-10 LAB — HEMOGLOBIN A1C: Hgb A1c MFr Bld: 7.2 % — ABNORMAL HIGH (ref 4.6–6.5)

## 2022-04-10 LAB — CK: Total CK: 64 U/L (ref 7–177)

## 2022-04-12 ENCOUNTER — Other Ambulatory Visit: Payer: Self-pay | Admitting: Family Medicine

## 2022-05-02 ENCOUNTER — Encounter: Payer: Self-pay | Admitting: Family Medicine

## 2022-05-02 MED ORDER — GABAPENTIN 100 MG PO CAPS: 100.0000 mg | ORAL_CAPSULE | Freq: Every day | ORAL | 6 refills | Status: DC

## 2022-05-02 NOTE — Telephone Encounter (Signed)
Gabapentin ?Last rx:  04/07/22, #50 ?Last OV: 01/09/22, AWV ?Next OV:  07/11/22, 6 mo f/u ?

## 2022-05-02 NOTE — Telephone Encounter (Signed)
ERx 

## 2022-05-11 ENCOUNTER — Other Ambulatory Visit: Payer: Self-pay | Admitting: Family Medicine

## 2022-05-11 NOTE — Telephone Encounter (Signed)
Tizanidine Last filled: 05/02/22, #30 Last OV:  01/09/22, AWV Next OV: 07/11/22, 6 mo f/u

## 2022-05-16 DIAGNOSIS — H524 Presbyopia: Secondary | ICD-10-CM | POA: Diagnosis not present

## 2022-05-16 DIAGNOSIS — E113393 Type 2 diabetes mellitus with moderate nonproliferative diabetic retinopathy without macular edema, bilateral: Secondary | ICD-10-CM | POA: Diagnosis not present

## 2022-05-31 ENCOUNTER — Other Ambulatory Visit: Payer: Self-pay | Admitting: Family Medicine

## 2022-07-10 ENCOUNTER — Other Ambulatory Visit: Payer: Self-pay | Admitting: Family Medicine

## 2022-07-10 NOTE — Telephone Encounter (Signed)
Refill Tizanidine Last refill 05/15/22 #30/3 Last office visit 01/09/22 Patient was scheduled for an office visit with Dr. Danise Mina tomorrow 07/11/22 but it was cancelled.

## 2022-07-11 ENCOUNTER — Ambulatory Visit: Payer: Medicare HMO | Admitting: Family Medicine

## 2022-07-17 ENCOUNTER — Telehealth: Payer: Self-pay | Admitting: Family Medicine

## 2022-07-17 NOTE — Telephone Encounter (Signed)
Seen at mother's OV today - requests handicap placard application for chronic lower back pain, known lumbar scoliosis and spondylosis previously followed by neurosurgeon Dr Carloyn Manner in [facility]  Severely limited due to arthritic or orthopedic condition.

## 2022-07-21 ENCOUNTER — Other Ambulatory Visit: Payer: Self-pay | Admitting: Family Medicine

## 2022-08-06 ENCOUNTER — Other Ambulatory Visit: Payer: Self-pay | Admitting: Family Medicine

## 2022-08-06 NOTE — Telephone Encounter (Signed)
Tizanidine Last office visit 01/09/22 Last refill 07/23/22 #30/3 Pharmacy  is requesting #[phone]

## 2022-08-13 ENCOUNTER — Other Ambulatory Visit: Payer: Self-pay | Admitting: Family Medicine

## 2022-08-13 DIAGNOSIS — E1169 Type 2 diabetes mellitus with other specified complication: Secondary | ICD-10-CM

## 2022-08-14 NOTE — Telephone Encounter (Signed)
Please call patient and schedule a follow-up appointment per last office note.

## 2022-08-14 NOTE — Telephone Encounter (Signed)
Patient has been scheduled

## 2022-08-14 NOTE — Telephone Encounter (Signed)
Noted  

## 2022-08-28 DIAGNOSIS — H43812 Vitreous degeneration, left eye: Secondary | ICD-10-CM | POA: Diagnosis not present

## 2022-08-28 DIAGNOSIS — H2513 Age-related nuclear cataract, bilateral: Secondary | ICD-10-CM | POA: Diagnosis not present

## 2022-08-28 DIAGNOSIS — H43821 Vitreomacular adhesion, right eye: Secondary | ICD-10-CM | POA: Diagnosis not present

## 2022-08-28 DIAGNOSIS — H35371 Puckering of macula, right eye: Secondary | ICD-10-CM | POA: Diagnosis not present

## 2022-09-10 ENCOUNTER — Ambulatory Visit (INDEPENDENT_AMBULATORY_CARE_PROVIDER_SITE_OTHER): Payer: Medicare HMO | Admitting: Family Medicine

## 2022-09-10 ENCOUNTER — Encounter: Payer: Self-pay | Admitting: Family Medicine

## 2022-09-10 VITALS — BP 122/68 | HR 83 | Temp 97.3°F | Ht <= 58 in | Wt 155.5 lb

## 2022-09-10 DIAGNOSIS — E785 Hyperlipidemia, unspecified: Secondary | ICD-10-CM

## 2022-09-10 DIAGNOSIS — E1169 Type 2 diabetes mellitus with other specified complication: Secondary | ICD-10-CM | POA: Diagnosis not present

## 2022-09-10 DIAGNOSIS — J449 Chronic obstructive pulmonary disease, unspecified: Secondary | ICD-10-CM | POA: Insufficient documentation

## 2022-09-10 DIAGNOSIS — Z23 Encounter for immunization: Secondary | ICD-10-CM

## 2022-09-10 DIAGNOSIS — J439 Emphysema, unspecified: Secondary | ICD-10-CM

## 2022-09-10 DIAGNOSIS — I2510 Atherosclerotic heart disease of native coronary artery without angina pectoris: Secondary | ICD-10-CM | POA: Diagnosis not present

## 2022-09-10 LAB — POCT GLYCOSYLATED HEMOGLOBIN (HGB A1C): Hemoglobin A1C: 7.2 % — AB (ref 4.0–5.6)

## 2022-09-10 MED ORDER — TRULICITY 0.75 MG/0.5ML ~~LOC~~ SOAJ: 0.7500 mg | SUBCUTANEOUS | 5 refills | Status: DC

## 2022-09-10 NOTE — Assessment & Plan Note (Signed)
Chronic, above goal only on metformin '500mg'$  BID.  Discussed further treatment options - will price out trulicity 0.'75mg'$  weekly, reviewed mechanism of action as well as side effects to watch for including nausea, constipation, pancreatitis.  Reassess at 4 mo CPE.

## 2022-09-10 NOTE — Patient Instructions (Addendum)
Flu shot today  We will request latest diabetic eye exam from Dr Gloriann Loan.  Price out trulicity 0.'75mg'$  weekly - sent to pharmacy. Watch for nausea, constipation, upper abdominal pain.  Return in 4-5 months for wellness visit/physical.

## 2022-09-10 NOTE — Assessment & Plan Note (Signed)
She's been forgetting weekly lovastatin - encouraged regular use. Will reassess control at upcoming CPE.  The 10-year ASCVD risk score (Arnett DK, et al., 2019) is: 17.1%   Values used to calculate the score:     Age: 66 years     Sex: Female     Is Non-Hispanic African American: No     Diabetic: Yes     Tobacco smoker: No     Systolic Blood Pressure: 184 mmHg     Is BP treated: Yes     HDL Cholesterol: 27.1 mg/dL     Total Cholesterol: 146 mg/dL

## 2022-09-10 NOTE — Assessment & Plan Note (Signed)
LAD calcification by recent lung cancer screen.

## 2022-09-10 NOTE — Assessment & Plan Note (Signed)
Emphysema noted on recent lung CT scan. Pt asxs from respiratory standpoint.

## 2022-09-10 NOTE — Progress Notes (Signed)
Patient ID: Olivia Benton, female    DOB: 1956/01/16, 66 y.o.   MRN: [id number]  This visit was conducted in person.  BP 122/68   Pulse 83   Temp (!) 97.3 F (36.3 C) (Temporal)   Ht _0  (1.473 m)   Wt 155 lb 8 oz (70.5 kg)   SpO2 96%   BMI 32.50 kg/m    CC: f/u visit  Subjective:   HPI: Olivia Benton is a 66 y.o. female presenting on 09/10/2022 for Follow-up   She's been caring for mother 24/7. Caregiver stress present.  She takes prozac 74m daily as well as gabapentin 2025mnightly with benefit.  DM - does regularly check sugars fasting - 122 this morning, usually <150, 160 in afternoons. Compliant with antihyperglycemic regimen which includes: metformin 50053mid. Denies low sugars or hypoglycemic symptoms. Denies blurry vision. Chronic L foot and leg paresthesias. Last diabetic eye exam saw this year ([facility] records requested. Referred to Dr SanBaird Cancerr macular pucker. Glucometer brand: one touch ultra. Last foot exam: 05/2021 - DUE. DSME: remotely. Lab Results  Component Value Date   HGBA1C 7.2 (A) 09/10/2022   Diabetic Foot Exam - Simple   Simple Foot Form Diabetic Foot exam was performed with the following findings: Yes 09/10/2022  3:28 PM  Visual Inspection No deformities, no ulcerations, no other skin breakdown bilaterally: Yes Sensation Testing Intact to touch and monofilament testing bilaterally: Yes Pulse Check See comments: Yes Comments 1+ DP bilaterally    Lab Results  Component Value Date   MICROALBUR 3.9 (H) 04/10/2022         Relevant past medical, surgical, family and social history reviewed and updated as indicated. Interim medical history since our last visit reviewed. Allergies and medications reviewed and updated. Outpatient Medications Prior to Visit  Medication Sig Dispense Refill   Blood Glucose Monitoring Suppl (ONE TOUCH ULTRA MINI) w/Device KIT Use as instructed to check blood sugar once daily. 1 kit 0   Cholecalciferol  (VITAMIN D) 50 MCG (2000 UT) CAPS Take 1 capsule (2,000 Units total) by mouth daily. 30 capsule    desonide (DESOWEN) 0.05 % cream apply to affected area twice a day 30 g 0   FLUoxetine (PROZAC) 20 MG capsule TAKE 1 CAPSULE BY MOUTH EVERY DAY 90 capsule 1   fluticasone (FLONASE) 50 MCG/ACT nasal spray USE 2 SPRAYS IN BOTH  NOSTRILS DAILY 48 g 2   gabapentin (NEURONTIN) 100 MG capsule Take 1-2 capsules (100-200 mg total) by mouth at bedtime. 60 capsule 6   glucose blood (ONETOUCH ULTRA) test strip USE TO CHECK SUGAR ONCE DAILY. DX: E11.9 100 strip 0   hydrochlorothiazide (MICROZIDE) 12.5 MG capsule TAKE 1 CAPSULE BY MOUTH EVERY DAY 90 capsule 3   Iron, Ferrous Sulfate, 325 (65 Fe) MG TABS Take 325 mg by mouth every Monday, Wednesday, and Friday.     levocetirizine (XYZAL) 5 MG tablet Take 5 mg by mouth every evening.     lovastatin (MEVACOR) 20 MG tablet Take 1 tablet (20 mg total) by mouth once a week. 13 tablet 3   metFORMIN (GLUCOPHAGE) 500 MG tablet TAKE 1 TABLET BY MOUTH TWICE A DAY WITH MEALS 180 tablet 2   montelukast (SINGULAIR) 10 MG tablet Take 1 tablet (10 mg total) by mouth at bedtime. 90 tablet 3   Multiple Minerals-Vitamins (CALCIUM & VIT D3 BONE HEALTH PO) Take by mouth daily.     omeprazole (PRILOSEC) 40 MG capsule Take 1 capsule (  40 mg total) by mouth daily. 90 capsule 3   OneTouch Delica Lancets 17B MISC Use to check sugar once daily. Dx: E11.9 100 each 3   Peppermint Oil (IBGARD) 90 MG CPCR Take 1 capsule by mouth 3 (three) times daily before meals.     tiZANidine (ZANAFLEX) 2 MG tablet TAKE 1 TABLET BY MOUTH TWICE A DAY AS NEEDED FOR MUSCLE SPASMS 180 tablet 1   valsartan (DIOVAN) 160 MG tablet Take 1 tablet (160 mg total) by mouth daily. 90 tablet 3   vitamin C (ASCORBIC ACID) 500 MG tablet Take 500 mg by mouth daily.     No facility-administered medications prior to visit.     Per HPI unless specifically indicated in ROS section below Review of Systems  Objective:  BP  122/68   Pulse 83   Temp (!) 97.3 F (36.3 C) (Temporal)   Ht _0  (1.473 m)   Wt 155 lb 8 oz (70.5 kg)   SpO2 96%   BMI 32.50 kg/m   Wt Readings from Last 3 Encounters:  09/10/22 155 lb 8 oz (70.5 kg)  01/09/22 156 lb 7 oz (71 kg)  06/12/21 158 lb 7 oz (71.9 kg)      Physical Exam Vitals and nursing note reviewed.  Constitutional:      Appearance: Normal appearance. She is not ill-appearing.  Eyes:     Extraocular Movements: Extraocular movements intact.     Conjunctiva/sclera: Conjunctivae normal.     Pupils: Pupils are equal, round, and reactive to light.  Cardiovascular:     Rate and Rhythm: Normal rate and regular rhythm.     Pulses: Normal pulses.     Heart sounds: Normal heart sounds. No murmur heard. Pulmonary:     Effort: Pulmonary effort is normal. No respiratory distress.     Breath sounds: Normal breath sounds. No wheezing, rhonchi or rales.  Musculoskeletal:     Right lower leg: No edema.     Left lower leg: No edema.     Comments: See HPI for foot exam if done  Skin:    General: Skin is warm and dry.     Findings: No rash.  Neurological:     Mental Status: She is alert.  Psychiatric:        Mood and Affect: Mood normal.        Behavior: Behavior normal.       Results for orders placed or performed in visit on 09/10/22  POCT glycosylated hemoglobin (Hb A1C)  Result Value Ref Range   Hemoglobin A1C 7.2 (A) 4.0 - 5.6 %   HbA1c POC (<> result, manual entry)     HbA1c, POC (prediabetic range)     HbA1c, POC (controlled diabetic range)     Lab Results  Component Value Date   CHOL 146 04/10/2022   HDL 27.10 (L) 04/10/2022   LDLCALC 83 04/10/2022   TRIG 178.0 (H) 04/10/2022   CHOLHDL 5 04/10/2022    Assessment & Plan:   Problem List Items Addressed This Visit     Type 2 diabetes mellitus with other specified complication ([place]) - Primary    Chronic, above goal only on metformin 517m BID.  Discussed further treatment options - will price out  trulicity 09.39QZweekly, reviewed mechanism of action as well as side effects to watch for including nausea, constipation, pancreatitis.  Reassess at 4 mo CPE.       Relevant Medications   Dulaglutide (TRULICITY) 00.09MQZ/3.0QTSOPN  Dyslipidemia associated with type 2 diabetes mellitus ([place])    She's been forgetting weekly lovastatin - encouraged regular use. Will reassess control at upcoming CPE.  The 10-year ASCVD risk score (Arnett DK, et al., 2019) is: 17.1%   Values used to calculate the score:     Age: 87 years     Sex: Female     Is Non-Hispanic African American: No     Diabetic: Yes     Tobacco smoker: No     Systolic Blood Pressure: 829 mmHg     Is BP treated: Yes     HDL Cholesterol: 27.1 mg/dL     Total Cholesterol: 146 mg/dL       Relevant Medications   Dulaglutide (TRULICITY) 5.62 ZH/0.8MV SOPN   CAD (coronary artery disease)    LAD calcification by recent lung cancer screen.       COPD (chronic obstructive pulmonary disease) (HCC)    Emphysema noted on recent lung CT scan. Pt asxs from respiratory standpoint.       Other Visit Diagnoses     Need for influenza vaccination       Relevant Orders   Flu Vaccine QUAD High Dose(Fluad) (Completed)        Meds ordered this encounter  Medications   Dulaglutide (TRULICITY) 7.84 ON/6.2XB SOPN    Sig: Inject 0.75 mg into the skin once a week.    Dispense:  2 mL    Refill:  5   Orders Placed This Encounter  Procedures   Flu Vaccine QUAD High Dose(Fluad)   POCT glycosylated hemoglobin (Hb A1C)     Patient Instructions  Flu shot today  We will request latest diabetic eye exam from Dr Gloriann Loan.  Price out trulicity 2.84XL weekly - sent to pharmacy. Watch for nausea, constipation, upper abdominal pain.  Return in 4-5 months for wellness visit/physical.   Follow up plan: Return in about 4 months (around 01/10/2023) for annual exam, prior fasting for blood work, medicare wellness visit.  Ria Bush, MD

## 2022-09-27 ENCOUNTER — Other Ambulatory Visit: Payer: Self-pay | Admitting: Family Medicine

## 2022-09-27 DIAGNOSIS — Z636 Dependent relative needing care at home: Secondary | ICD-10-CM

## 2022-10-16 ENCOUNTER — Encounter: Payer: Self-pay | Admitting: Family Medicine

## 2022-10-22 ENCOUNTER — Ambulatory Visit (INDEPENDENT_AMBULATORY_CARE_PROVIDER_SITE_OTHER): Payer: Medicare HMO | Admitting: Family Medicine

## 2022-10-22 ENCOUNTER — Encounter: Payer: Self-pay | Admitting: Family Medicine

## 2022-10-22 VITALS — BP 124/68 | HR 80 | Temp 97.3°F | Ht <= 58 in | Wt 153.0 lb

## 2022-10-22 DIAGNOSIS — N760 Acute vaginitis: Secondary | ICD-10-CM | POA: Diagnosis not present

## 2022-10-22 DIAGNOSIS — E1169 Type 2 diabetes mellitus with other specified complication: Secondary | ICD-10-CM | POA: Diagnosis not present

## 2022-10-22 DIAGNOSIS — K219 Gastro-esophageal reflux disease without esophagitis: Secondary | ICD-10-CM | POA: Diagnosis not present

## 2022-10-22 DIAGNOSIS — K760 Fatty (change of) liver, not elsewhere classified: Secondary | ICD-10-CM | POA: Diagnosis not present

## 2022-10-22 DIAGNOSIS — R1032 Left lower quadrant pain: Secondary | ICD-10-CM | POA: Diagnosis not present

## 2022-10-22 LAB — POC URINALSYSI DIPSTICK (AUTOMATED)
Bilirubin, UA: NEGATIVE
Blood, UA: NEGATIVE
Glucose, UA: NEGATIVE
Ketones, UA: NEGATIVE
Leukocytes, UA: NEGATIVE
Nitrite, UA: NEGATIVE
Protein, UA: NEGATIVE
Spec Grav, UA: 1.01 (ref 1.010–1.025)
Urobilinogen, UA: 0.2 E.U./dL
pH, UA: 6.5 (ref 5.0–8.0)

## 2022-10-22 MED ORDER — PIOGLITAZONE HCL 15 MG PO TABS: 15.0000 mg | ORAL_TABLET | Freq: Every day | ORAL | 6 refills | Status: DC

## 2022-10-22 MED ORDER — FAMOTIDINE 20 MG PO TABS: 20.0000 mg | ORAL_TABLET | Freq: Every day | ORAL | Status: DC

## 2022-10-22 MED ORDER — FLUCONAZOLE 150 MG PO TABS: 150.0000 mg | ORAL_TABLET | Freq: Once | ORAL | 0 refills | Status: AC

## 2022-10-22 NOTE — Assessment & Plan Note (Addendum)
Noted on prior CT scan.  Start actos.

## 2022-10-22 NOTE — Assessment & Plan Note (Signed)
LLQ abd pain associated with dysphagia since starting trulicity.  Likely med related - stop trulicity.   Known diverticulosis - check labs to evaluate for diverticulitis and pancreatitis.  Update if ongoing symptoms after stopping GLP1RA.

## 2022-10-22 NOTE — Patient Instructions (Addendum)
Stop trulicity. Update [place] if ongoing pain despite stopping.  Start in its place actos '15mg'$  daily. Continue metformin.  Labs today.  Continue omeprazole and pepcid daily.  Diflucan sent to pharmacy for yeast. Let us know if ongoing symptoms.  Urinalysis today.

## 2022-10-22 NOTE — Assessment & Plan Note (Signed)
Not improved with Monistat.  Rx diflucan.  Update if ongoing symptoms for further evaluation.

## 2022-10-22 NOTE — Assessment & Plan Note (Signed)
Continues omeprazole '40mg'$  daily with pepcid '20mg'$  nightly. Breakthrough symptoms while on trulicity, however now improved off GLP1RA.

## 2022-10-22 NOTE — Progress Notes (Signed)
Patient ID: Olivia Benton, female    DOB: Dec 28, 1955, 66 y.o.   MRN: [id number]  This visit was conducted in person.  BP 124/68   Pulse 80   Temp (!) 97.3 F (36.3 C) (Temporal)   Ht _0  (1.473 m)   Wt 153 lb (69.4 kg)   SpO2 97%   BMI 31.98 kg/m    CC: stomach issues Subjective:   HPI: Olivia Benton is a 66 y.o. female presenting on 10/22/2022 for GI Problem (C/o LLQ abd pain and dysphagia since starting Trulicity. )   Trulicity started 6 wks ago - since then noticing dysphagia and intermittent left lower abdominal discomfort associated with nausea. Also notes increased belching, indigestion, bloating. Occ epigastric pain but without radiation to the back.  Also notes recurrent yeast infections since trulicity - presenting with int/ext itching to vaginal area, no discharge. 2 episodes so far. Monistat hasn't helped.   She continues omeprazole daily with pepcid nightly. Previous to trulicity, GERD symptoms were well controlled. 1 year ago started omeprazole daily.  She also continues IBGard 28m before dinner.   No fevers/chills, vomiting, diarrhea, constipation, blood in stool.  No early satiety.   Tolerates coffee/oatmeal well in the mornings, but other fluids even water causes burning to throat. Also notes solid food exacerbates symptoms. Sodas really burn.  Fasting cbg 118 in am.  Lab Results  Component Value Date   HGBA1C 7.2 (A) 09/10/2022   Remote EGD 1990s - gastritis, treated with omeprazole.     Relevant past medical, surgical, family and social history reviewed and updated as indicated. Interim medical history since our last visit reviewed. Allergies and medications reviewed and updated. Outpatient Medications Prior to Visit  Medication Sig Dispense Refill   Blood Glucose Monitoring Suppl (ONE TOUCH ULTRA MINI) w/Device KIT Use as instructed to check blood sugar once daily. 1 kit 0   Cholecalciferol (VITAMIN D) 50 MCG (2000 UT) CAPS Take 1 capsule  (2,000 Units total) by mouth daily. 30 capsule    desonide (DESOWEN) 0.05 % cream apply to affected area twice a day 30 g 0   FLUoxetine (PROZAC) 20 MG capsule TAKE 1 CAPSULE BY MOUTH EVERY DAY 90 capsule 1   fluticasone (FLONASE) 50 MCG/ACT nasal spray USE 2 SPRAYS IN BOTH  NOSTRILS DAILY 48 g 2   gabapentin (NEURONTIN) 100 MG capsule Take 1-2 capsules (100-200 mg total) by mouth at bedtime. 60 capsule 6   glucose blood (ONETOUCH ULTRA) test strip USE TO CHECK SUGAR ONCE DAILY. DX: E11.9 100 strip 0   hydrochlorothiazide (MICROZIDE) 12.5 MG capsule TAKE 1 CAPSULE BY MOUTH EVERY DAY 90 capsule 3   levocetirizine (XYZAL) 5 MG tablet Take 5 mg by mouth every evening.     lovastatin (MEVACOR) 20 MG tablet Take 1 tablet (20 mg total) by mouth once a week. 13 tablet 3   metFORMIN (GLUCOPHAGE) 500 MG tablet TAKE 1 TABLET BY MOUTH TWICE A DAY WITH MEALS 180 tablet 2   montelukast (SINGULAIR) 10 MG tablet Take 1 tablet (10 mg total) by mouth at bedtime. 90 tablet 3   Multiple Minerals-Vitamins (CALCIUM & VIT D3 BONE HEALTH PO) Take by mouth daily.     omeprazole (PRILOSEC) 40 MG capsule Take 1 capsule (40 mg total) by mouth daily. 90 capsule 3   OneTouch Delica Lancets 324MMISC Use to check sugar once daily. Dx: E11.9 100 each 3   Peppermint Oil (IBGARD) 90 MG CPCR Take 1 capsule by  mouth 3 (three) times daily before meals.     tiZANidine (ZANAFLEX) 2 MG tablet TAKE 1 TABLET BY MOUTH TWICE A DAY AS NEEDED FOR MUSCLE SPASMS 180 tablet 1   valsartan (DIOVAN) 160 MG tablet Take 1 tablet (160 mg total) by mouth daily. 90 tablet 3   vitamin C (ASCORBIC ACID) 500 MG tablet Take 500 mg by mouth daily.     Dulaglutide (TRULICITY) 0.62 IR/4.8NI SOPN Inject 0.75 mg into the skin once a week. 2 mL 5   Iron, Ferrous Sulfate, 325 (65 Fe) MG TABS Take 325 mg by mouth every Monday, Wednesday, and Friday.     No facility-administered medications prior to visit.     Per HPI unless specifically indicated in ROS  section below Review of Systems  Objective:  BP 124/68   Pulse 80   Temp (!) 97.3 F (36.3 C) (Temporal)   Ht _0  (1.473 m)   Wt 153 lb (69.4 kg)   SpO2 97%   BMI 31.98 kg/m   Wt Readings from Last 3 Encounters:  10/22/22 153 lb (69.4 kg)  09/10/22 155 lb 8 oz (70.5 kg)  01/09/22 156 lb 7 oz (71 kg)      Physical Exam Vitals and nursing note reviewed.  Constitutional:      Appearance: Normal appearance. She is not ill-appearing.  HENT:     Mouth/Throat:     Mouth: Mucous membranes are moist.     Pharynx: Oropharynx is clear. No oropharyngeal exudate or posterior oropharyngeal erythema.  Eyes:     Extraocular Movements: Extraocular movements intact.     Pupils: Pupils are equal, round, and reactive to light.  Cardiovascular:     Rate and Rhythm: Normal rate and regular rhythm.     Pulses: Normal pulses.     Heart sounds: Normal heart sounds. No murmur heard. Pulmonary:     Effort: Pulmonary effort is normal. No respiratory distress.     Breath sounds: Normal breath sounds. No wheezing, rhonchi or rales.  Abdominal:     General: Bowel sounds are normal. There is no distension.     Palpations: Abdomen is soft. There is no mass.     Tenderness: There is abdominal tenderness (mild) in the epigastric area. There is no right CVA tenderness, left CVA tenderness, guarding or rebound. Negative signs include Murphy's sign.     Hernia: No hernia is present.  Musculoskeletal:     Right lower leg: No edema.     Left lower leg: No edema.  Skin:    General: Skin is warm and dry.     Findings: No rash.  Neurological:     Mental Status: She is alert.  Psychiatric:        Mood and Affect: Mood normal.        Behavior: Behavior normal.       Results for orders placed or performed in visit on 10/22/22  POCT Urinalysis Dipstick (Automated)  Result Value Ref Range   Color, UA yellow    Clarity, UA clear    Glucose, UA Negative Negative   Bilirubin, UA negative    Ketones,  UA negative    Spec Grav, UA 1.010 1.010 - 1.025   Blood, UA negative    pH, UA 6.5 5.0 - 8.0   Protein, UA Negative Negative   Urobilinogen, UA 0.2 0.2 or 1.0 E.U./dL   Nitrite, UA negative    Leukocytes, UA Negative Negative    Assessment & Plan:  Problem List Items Addressed This Visit     Type 2 diabetes mellitus with other specified complication ([place])    Stop Trulicity due to side effects. Update if ongoing symptoms after stopping this.  In h/o fatty liver, start actos 83m daily. Check baseline UA.       Relevant Medications   pioglitazone (ACTOS) 15 MG tablet   GERD (gastroesophageal reflux disease)    Continues omeprazole 442mdaily with pepcid 2074mightly. Breakthrough symptoms while on trulicity, however now improved off GLP1RA.       Relevant Medications   famotidine (PEPCID) 20 MG tablet   Left lower quadrant abdominal pain - Primary    LLQ abd pain associated with dysphagia since starting trulicity.  Likely med related - stop trulicity.   Known diverticulosis - check labs to evaluate for diverticulitis and pancreatitis.  Update if ongoing symptoms after stopping GLP1RA.       Relevant Orders   Lipase   Comprehensive metabolic panel   CBC with Differential/Platelet   POCT Urinalysis Dipstick (Automated) (Completed)   Fatty liver    Noted on prior CT scan.  Start actos.      Vaginitis    Not improved with Monistat.  Rx diflucan.  Update if ongoing symptoms for further evaluation.         Meds ordered this encounter  Medications   fluconazole (DIFLUCAN) 150 MG tablet    Sig: Take 1 tablet (150 mg total) by mouth once for 1 dose.    Dispense:  1 tablet    Refill:  0   famotidine (PEPCID) 20 MG tablet    Sig: Take 1 tablet (20 mg total) by mouth at bedtime.   pioglitazone (ACTOS) 15 MG tablet    Sig: Take 1 tablet (15 mg total) by mouth daily.    Dispense:  30 tablet    Refill:  6   Orders Placed This Encounter  Procedures   Lipase    Comprehensive metabolic panel   CBC with Differential/Platelet   POCT Urinalysis Dipstick (Automated)     Patient Instructions  Stop trulicity. Update us [place] ongoing pain despite stopping.  Start in its place actos 57m1mily. Continue metformin.  Labs today.  Continue omeprazole and pepcid daily.  Diflucan sent to pharmacy for yeast. Let us [place] if ongoing symptoms.  Urinalysis today.   Follow up plan: Return if symptoms worsen or fail to improve.  JaviRia Bush

## 2022-10-22 NOTE — Assessment & Plan Note (Addendum)
Stop Trulicity due to side effects. Update if ongoing symptoms after stopping this.  In h/o fatty liver, start actos '15mg'$  daily. Check baseline UA.

## 2022-10-23 LAB — COMPREHENSIVE METABOLIC PANEL
ALT: 45 U/L — ABNORMAL HIGH (ref 0–35)
AST: 38 U/L — ABNORMAL HIGH (ref 0–37)
Albumin: 4.5 g/dL (ref 3.5–5.2)
Alkaline Phosphatase: 54 U/L (ref 39–117)
BUN: 14 mg/dL (ref 6–23)
CO2: 31 mEq/L (ref 19–32)
Calcium: 9.8 mg/dL (ref 8.4–10.5)
Chloride: 98 mEq/L (ref 96–112)
Creatinine, Ser: 0.77 mg/dL (ref 0.40–1.20)
GFR: 79.75 mL/min (ref >60.00)
Glucose, Bld: 100 mg/dL — ABNORMAL HIGH (ref 70–99)
Potassium: 3.6 mEq/L (ref 3.5–5.1)
Sodium: 137 mEq/L (ref 135–145)
Total Bilirubin: 0.4 mg/dL (ref 0.2–1.2)
Total Protein: 7.8 g/dL (ref 6.0–8.3)

## 2022-10-23 LAB — CBC WITH DIFFERENTIAL/PLATELET
Basophils Absolute: 0.1 10*3/uL (ref 0.0–0.1)
Basophils Relative: 0.9 % (ref 0.0–3.0)
Eosinophils Absolute: 0.2 10*3/uL (ref 0.0–0.7)
Eosinophils Relative: 3.2 % (ref 0.0–5.0)
HCT: 36.8 % (ref 36.0–46.0)
Hemoglobin: 12.2 g/dL (ref 12.0–15.0)
Lymphocytes Relative: 34.2 % (ref 12.0–46.0)
Lymphs Abs: 2.1 10*3/uL (ref 0.7–4.0)
MCHC: 33 g/dL (ref 30.0–36.0)
MCV: 85.6 fl (ref 78.0–100.0)
Monocytes Absolute: 0.6 10*3/uL (ref 0.1–1.0)
Monocytes Relative: 9.1 % (ref 3.0–12.0)
Neutro Abs: 3.3 10*3/uL (ref 1.4–7.7)
Neutrophils Relative %: 52.6 % (ref 43.0–77.0)
Platelets: 297 10*3/uL (ref 150.0–400.0)
RBC: 4.3 Mil/uL (ref 3.87–5.11)
RDW: 15 % (ref 11.5–15.5)
WBC: 6.3 10*3/uL (ref 4.0–10.5)

## 2022-10-23 LAB — LIPASE: Lipase: 40 U/L (ref 11.0–59.0)

## 2022-11-05 ENCOUNTER — Encounter: Payer: Self-pay | Admitting: Family Medicine

## 2022-11-05 DIAGNOSIS — K219 Gastro-esophageal reflux disease without esophagitis: Secondary | ICD-10-CM

## 2022-11-05 DIAGNOSIS — R1319 Other dysphagia: Secondary | ICD-10-CM

## 2022-11-13 NOTE — Addendum Note (Signed)
Addended by: Ria Bush on: 11/13/2022 07:59 AM   Modules accepted: Orders

## 2022-11-17 ENCOUNTER — Other Ambulatory Visit: Payer: Self-pay | Admitting: Family Medicine

## 2022-11-17 DIAGNOSIS — E1169 Type 2 diabetes mellitus with other specified complication: Secondary | ICD-10-CM

## 2022-12-03 ENCOUNTER — Other Ambulatory Visit: Payer: Self-pay | Admitting: Family Medicine

## 2022-12-04 NOTE — Telephone Encounter (Signed)
Gabapentin Last filled:  11/04/22, #60 Last OV:  10/22/22, GI problem Next OV: 01/15/23, CPE

## 2022-12-20 ENCOUNTER — Encounter: Payer: Self-pay | Admitting: Gastroenterology

## 2022-12-20 ENCOUNTER — Ambulatory Visit (INDEPENDENT_AMBULATORY_CARE_PROVIDER_SITE_OTHER): Payer: PPO | Admitting: Gastroenterology

## 2022-12-20 VITALS — BP 128/81 | HR 85 | Temp 97.7°F | Ht <= 58 in | Wt 155.0 lb

## 2022-12-20 DIAGNOSIS — Z1211 Encounter for screening for malignant neoplasm of colon: Secondary | ICD-10-CM | POA: Diagnosis not present

## 2022-12-20 DIAGNOSIS — R1310 Dysphagia, unspecified: Secondary | ICD-10-CM | POA: Diagnosis not present

## 2022-12-20 DIAGNOSIS — R748 Abnormal levels of other serum enzymes: Secondary | ICD-10-CM

## 2022-12-20 MED ORDER — PANTOPRAZOLE SODIUM 40 MG PO TBEC: 40.0000 mg | DELAYED_RELEASE_TABLET | Freq: Every day | ORAL | 5 refills | Status: DC

## 2022-12-20 MED ORDER — NA SULFATE-K SULFATE-MG SULF 17.5-3.13-1.6 GM/177ML PO SOLN: 1.0000 | Freq: Once | ORAL | 0 refills | Status: AC

## 2022-12-20 NOTE — Progress Notes (Signed)
Gastroenterology Consultation  Referring Provider:     Ria Bush, MD Primary Care Physician:  Ria Bush, MD Primary Gastroenterologist:  Dr. Allen Norris     Reason for Consultation:     GERD        HPI:   Olivia Benton is a 67 y.o. y/o female referred for consultation & management of GERD by Dr. Ria Bush, MD. This patient comes in today for consultation due to a report of GERD.  It appears the patient has also had a low iron saturation for some time and her liver enzymes have shown:  Component     Latest Ref Rng 12/05/2020 01/09/2022 04/10/2022 10/22/2022  Total Bilirubin     0.2 - 1.2 mg/dL 0.4  0.5  0.5  0.4   Alkaline Phosphatase     39 - 117 U/L 55  50  54  54   AST     0 - 37 U/L 31  28  32  38 (H)   ALT     0 - 35 U/L 38 (H)  37 (H)  40 (H)  45 (H)    When the patient was seen by her primary care provider back in November she had reported LLQ abd pain and dysphagia since starting Trulicity.  The patient was also started on Actos for a history of fatty liver with the patient's CT scan showing fatty liver back in 2016 with no further imaging of the liver since then.  The patient hepatitis C was negative few years back.  The patient was taking omeprazole for her reflux and was taking Pepcid also.  It was also reported that the patient was going to stop the Trulicity.  The patient also reports that she is having acid breakthrough despite taking the omeprazole and Pepcid.  She reports that her symptoms are worse when she has carbonated drinks but is okay if she has things like oatmeal in the morning.  Past Medical History:  Diagnosis Date   Asthma    extrinsic   Bilateral carpal tunnel syndrome 11/06/2016   Community acquired pneumonia 12/31/2016   Diabetes mellitus    non-insulin dependent   Foot fracture    History of deep vein thrombosis (DVT) of lower extremity 1974   after back surgery while on OCP   Hyperlipidemia    Hypertension    Rhinitis      Past Surgical History:  Procedure Laterality Date   BACK SURGERY  2010   hardware - Dr. Tonye Becket SURGERY  1974   scoliosis   BREAST BIOPSY Left 09/2011   Dr. Tamala Julian- neg- FIBROCYSTIC CHANGE    TOTAL ABDOMINAL HYSTERECTOMY  1991   endometriosis    Prior to Admission medications   Medication Sig Start Date End Date Taking? Authorizing Provider  Blood Glucose Monitoring Suppl (ONE TOUCH ULTRA MINI) w/Device KIT Use as instructed to check blood sugar once daily. 02/07/21   Ria Bush, MD  Cholecalciferol (VITAMIN D) 50 MCG (2000 UT) CAPS Take 1 capsule (2,000 Units total) by mouth daily. 01/11/22   Ria Bush, MD  desonide (DESOWEN) 0.05 % cream apply to affected area twice a day 01/08/20   Ria Bush, MD  famotidine (PEPCID) 20 MG tablet Take 1 tablet (20 mg total) by mouth at bedtime. 10/22/22   Ria Bush, MD  FLUoxetine (PROZAC) 20 MG capsule TAKE 1 CAPSULE BY MOUTH EVERY DAY 09/27/22   Ria Bush, MD  fluticasone [facility]) 50 MCG/ACT nasal spray USE  2 SPRAYS IN BOTH  NOSTRILS DAILY 05/24/20   Ria Bush, MD  gabapentin (NEURONTIN) 100 MG capsule TAKE 1-2 CAPSULES (100-200 MG TOTAL) BY MOUTH AT BEDTIME. 12/05/22   Ria Bush, MD  glucose blood (ONETOUCH ULTRA) test strip USE TO CHECK SUGAR ONCE DAILY. DX: E11.9 08/14/22   Ria Bush, MD  hydrochlorothiazide (MICROZIDE) 12.5 MG capsule TAKE 1 CAPSULE BY MOUTH EVERY DAY 01/10/22   Ria Bush, MD  levocetirizine (XYZAL) 5 MG tablet Take 5 mg by mouth every evening.    [provider]  lovastatin (MEVACOR) 20 MG tablet Take 1 tablet (20 mg total) by mouth once a week. 01/10/22   Ria Bush, MD  metFORMIN (GLUCOPHAGE) 500 MG tablet TAKE 1 TABLET BY MOUTH TWICE A DAY WITH MEALS 11/19/22   Ria Bush, MD  montelukast (SINGULAIR) 10 MG tablet Take 1 tablet (10 mg total) by mouth at bedtime. 01/10/22   Ria Bush, MD  Multiple Minerals-Vitamins (CALCIUM & VIT  D3 BONE HEALTH PO) Take by mouth daily.    [provider]  omeprazole (PRILOSEC) 40 MG capsule Take 1 capsule (40 mg total) by mouth daily. 01/10/22   Ria Bush, MD  OneTouch Delica Lancets 11W MISC Use to check sugar once daily. Dx: E11.9 02/07/21   Ria Bush, MD  Peppermint Oil (IBGARD) 90 MG CPCR Take 1 capsule by mouth 3 (three) times daily before meals. 01/09/22   Ria Bush, MD  pioglitazone (ACTOS) 15 MG tablet Take 1 tablet (15 mg total) by mouth daily. 10/22/22   Ria Bush, MD  tiZANidine (ZANAFLEX) 2 MG tablet TAKE 1 TABLET BY MOUTH TWICE A DAY AS NEEDED FOR MUSCLE SPASMS 08/07/22   Ria Bush, MD  valsartan (DIOVAN) 160 MG tablet Take 1 tablet (160 mg total) by mouth daily. 01/10/22   Ria Bush, MD  vitamin C (ASCORBIC ACID) 500 MG tablet Take 500 mg by mouth daily.    [provider]    Family History  Problem Relation Age of Onset   Hypertension Mother    Heart disease Maternal Grandmother        pacemaker   Cancer Maternal Grandfather        liver   Leukemia Paternal Grandmother    Breast cancer Neg Hx      Social History   Tobacco Use   Smoking status: Former    Packs/day: 1.00    Years: 44.00    Total pack years: 44.00    Types: Cigarettes    Quit date: 10/17/2009    Years since quitting: 13.1   Smokeless tobacco: Never  Substance Use Topics   Alcohol use: No   Drug use: No    Allergies as of 12/20/2022 - Review Complete 10/22/2022  Allergen Reaction Noted   Amoxicillin  03/05/2017   Biaxin [clarithromycin]  10/18/2011   Gold-containing drug products  04/21/2012   Latex  10/18/2016   Statins  10/30/2013   Other Hives and Rash 10/30/2012    Review of Systems:    All systems reviewed and negative except where noted in HPI.   Physical Exam:  There were no vitals taken for this visit. No LMP recorded. Patient is postmenopausal. General:   Alert,  Well-developed, well-nourished, pleasant and  cooperative in NAD Head:  Normocephalic and atraumatic. Eyes:  Sclera clear, no icterus.   Conjunctiva pink. Ears:  Normal auditory acuity. Neck:  Supple; no masses or thyromegaly. Lungs:  Respirations even and unlabored.  Clear throughout to auscultation.  No wheezes, crackles, or rhonchi. No acute distress. Heart:  Regular rate and rhythm; no murmurs, clicks, rubs, or gallops. Abdomen:  Normal bowel sounds.  No bruits.  Soft, non-tender and non-distended without masses, hepatosplenomegaly or hernias noted.  No guarding or rebound tenderness.  Negative Carnett sign.   Rectal:  Deferred.  Pulses:  Normal pulses noted. Extremities:  No clubbing or edema.  No cyanosis. Neurologic:  Alert and oriented x3;  grossly normal neurologically. Skin:  Intact without significant lesions or rashes.  No jaundice. Lymph Nodes:  No significant cervical adenopathy. Psych:  Alert and cooperative. Normal mood and affect.  Imaging Studies: No results found.  Assessment and Plan:   ALIVIYA SCHOELLER is a 67 y.o. y/o female who comes in today with a history of abnormal liver enzymes with a finding of fatty liver on a CT scan in 2016.  The patient also reports heartburn with more symptoms with soda.  The patient's iron saturation was also found to be low despite having a normal iron.  The patient will have her lab sent off for other possible cause of abnormal liver enzymes.  The patient will also have a right upper quadrant ultrasound for more recent evaluation of her abnormal liver enzymes.  She will also be set up for an EGD and colonoscopy due to her report of never having a colonoscopy in the past and her GERD.  The patient will be notified with the results of her blood test.  She will also be started on Protonix instead of the omeprazole and Pepcid to see if this helps with her acid breakthrough.  The patient has been explained the plan and agrees with it.    Lucilla Lame, MD. Marval Regal    Note: This dictation  was prepared with Dragon dictation along with smaller phrase technology. Any transcriptional errors that result from this process are unintentional.

## 2022-12-20 NOTE — Patient Instructions (Addendum)
If you need to cancel or reschedule your ultrasound, please call [phone]  You will have to arrive at 8:30 am on 12/27/2022 to [facility] entrance  You cannot have anything to eat or drink after 12 midnight the day before  Stop Omeprazole and Pepcid  Start Pantoprazole 40 mg daily

## 2022-12-21 LAB — HEPATIC FUNCTION PANEL
ALT: 40 IU/L — ABNORMAL HIGH (ref 0–32)
AST: 35 IU/L (ref 0–40)
Albumin: 4.9 g/dL (ref 3.9–4.9)
Alkaline Phosphatase: 67 IU/L (ref 44–121)
Bilirubin Total: 0.2 mg/dL (ref 0.0–1.2)
Bilirubin, Direct: 0.14 mg/dL (ref 0.00–0.40)
Total Protein: 7.9 g/dL (ref 6.0–8.5)

## 2022-12-21 LAB — IRON,TIBC AND FERRITIN PANEL
Ferritin: 70 ng/mL (ref 15–150)
Iron Saturation: 11 % — ABNORMAL LOW (ref 15–55)
Iron: 42 ug/dL (ref 27–139)
Total Iron Binding Capacity: 371 ug/dL (ref 250–450)
UIBC: 329 ug/dL (ref 118–369)

## 2022-12-21 LAB — HEPATITIS B SURFACE ANTIBODY,QUALITATIVE: Hep B Surface Ab, Qual: REACTIVE

## 2022-12-21 LAB — ALPHA-1-ANTITRYPSIN: A-1 Antitrypsin: 163 mg/dL (ref 101–187)

## 2022-12-21 LAB — ANTI-SMOOTH MUSCLE ANTIBODY, IGG: Smooth Muscle Ab: 10 Units (ref 0–19)

## 2022-12-21 LAB — MITOCHONDRIAL ANTIBODIES: Mitochondrial Ab: <20 Units (ref 0.0–20.0)

## 2022-12-21 LAB — HEPATITIS A ANTIBODY, TOTAL: hep A Total Ab: NEGATIVE

## 2022-12-21 LAB — CERULOPLASMIN: Ceruloplasmin: 23.4 mg/dL (ref 19.0–39.0)

## 2022-12-21 LAB — HEPATITIS B CORE ANTIBODY, TOTAL: Hep B Core Total Ab: NEGATIVE

## 2022-12-21 LAB — HEPATITIS C ANTIBODY: Hep C Virus Ab: NONREACTIVE

## 2022-12-21 LAB — HEPATITIS B SURFACE ANTIGEN: Hepatitis B Surface Ag: NEGATIVE

## 2022-12-21 LAB — ANA: Anti Nuclear Antibody (ANA): NEGATIVE

## 2022-12-25 ENCOUNTER — Telehealth: Payer: Self-pay | Admitting: Family Medicine

## 2022-12-25 NOTE — Telephone Encounter (Signed)
LVM for pt to rtn my call to schedule AWV with [facility] call back # [phone] 

## 2022-12-26 ENCOUNTER — Encounter: Payer: Self-pay | Admitting: Gastroenterology

## 2022-12-26 ENCOUNTER — Ambulatory Visit: Payer: PPO

## 2022-12-27 ENCOUNTER — Ambulatory Visit
Admission: RE | Admit: 2022-12-27 | Discharge: 2022-12-27 | Disposition: A | Discharge status: Home / Self Care | Payer: PPO | Source: Ambulatory Visit | Attending: Gastroenterology | Admitting: Gastroenterology

## 2022-12-27 DIAGNOSIS — R945 Abnormal results of liver function studies: Secondary | ICD-10-CM | POA: Diagnosis not present

## 2022-12-27 DIAGNOSIS — R748 Abnormal levels of other serum enzymes: Secondary | ICD-10-CM | POA: Diagnosis not present

## 2022-12-27 DIAGNOSIS — K760 Fatty (change of) liver, not elsewhere classified: Secondary | ICD-10-CM | POA: Diagnosis not present

## 2022-12-31 ENCOUNTER — Encounter: Payer: Self-pay | Admitting: Gastroenterology

## 2023-01-01 ENCOUNTER — Other Ambulatory Visit: Payer: Self-pay | Admitting: Family Medicine

## 2023-01-01 ENCOUNTER — Ambulatory Visit: Payer: PPO

## 2023-01-01 DIAGNOSIS — J439 Emphysema, unspecified: Secondary | ICD-10-CM

## 2023-01-07 ENCOUNTER — Other Ambulatory Visit: Payer: Self-pay | Admitting: Family Medicine

## 2023-01-07 DIAGNOSIS — E1169 Type 2 diabetes mellitus with other specified complication: Secondary | ICD-10-CM

## 2023-01-07 DIAGNOSIS — E559 Vitamin D deficiency, unspecified: Secondary | ICD-10-CM

## 2023-01-08 ENCOUNTER — Ambulatory Visit (INDEPENDENT_AMBULATORY_CARE_PROVIDER_SITE_OTHER): Payer: PPO

## 2023-01-08 ENCOUNTER — Other Ambulatory Visit: Payer: Medicare HMO

## 2023-01-08 DIAGNOSIS — Z23 Encounter for immunization: Secondary | ICD-10-CM

## 2023-01-08 NOTE — Progress Notes (Signed)
Per orders of Dr. Allen Norris, injection of 1 of 3 Hep A given in Left Deltoid  by Lurlean Nanny. Patient tolerated injection well.   Next inj scheduled for 02/12/23

## 2023-01-10 ENCOUNTER — Ambulatory Visit (INDEPENDENT_AMBULATORY_CARE_PROVIDER_SITE_OTHER): Payer: PPO

## 2023-01-10 VITALS — Ht <= 58 in | Wt 155.0 lb

## 2023-01-10 DIAGNOSIS — Z0000 Encounter for general adult medical examination without abnormal findings: Secondary | ICD-10-CM

## 2023-01-10 DIAGNOSIS — Z780 Asymptomatic menopausal state: Secondary | ICD-10-CM

## 2023-01-10 DIAGNOSIS — Z1231 Encounter for screening mammogram for malignant neoplasm of breast: Secondary | ICD-10-CM | POA: Diagnosis not present

## 2023-01-10 NOTE — Progress Notes (Signed)
Virtual Visit via Telephone Note  I connected with  Olivia Benton on 01/10/23 at  2:30 PM EST by telephone and verified that I am speaking with the correct person using two identifiers.  Location: Patient: home Provider: [place] Persons participating in the virtual visit: [place]   I discussed the limitations, risks, security and privacy concerns of performing an evaluation and management service by telephone and the availability of in person appointments. The patient expressed understanding and agreed to proceed.  Interactive audio and video telecommunications were attempted between this nurse and patient, however failed, due to patient having technical difficulties OR patient did not have access to video capability.  We continued and completed visit with audio only.  Some vital signs may be absent or patient reported.   Dionisio David, LPN  Subjective:   Olivia Benton is a 67 y.o. female who presents for Medicare Annual (Subsequent) preventive examination.  Review of Systems     Cardiac Risk Factors include: advanced age (>37mn, >>60women);diabetes mellitus;hypertension     Objective:    There were no vitals filed for this visit. There is no height or weight on file to calculate BMI.     01/10/2023    2:31 PM  Advanced Directives  Does Patient Have a Medical Advance Directive? No  Would patient like information on creating a medical advance directive? No - Patient declined    Current Medications (verified) Outpatient Encounter Medications as of 01/10/2023  Medication Sig   Blood Glucose Monitoring Suppl (ONE TOUCH ULTRA MINI) w/Device KIT Use as instructed to check blood sugar once daily.   Cholecalciferol (VITAMIN D) 50 MCG (2000 UT) CAPS Take 1 capsule (2,000 Units total) by mouth daily.   FLUoxetine (PROZAC) 20 MG capsule TAKE 1 CAPSULE BY MOUTH EVERY DAY   fluticasone (FLONASE) 50 MCG/ACT nasal spray USE 2 SPRAYS IN BOTH   NOSTRILS DAILY   gabapentin (NEURONTIN) 100 MG capsule TAKE 1-2 CAPSULES (100-200 MG TOTAL) BY MOUTH AT BEDTIME.   glucose blood (ONETOUCH ULTRA) test strip USE TO CHECK SUGAR ONCE DAILY. DX: E11.9   hydrochlorothiazide (MICROZIDE) 12.5 MG capsule TAKE 1 CAPSULE BY MOUTH EVERY DAY   levocetirizine (XYZAL) 5 MG tablet Take 5 mg by mouth every evening.   lovastatin (MEVACOR) 20 MG tablet Take 1 tablet (20 mg total) by mouth once a week.   metFORMIN (GLUCOPHAGE) 500 MG tablet TAKE 1 TABLET BY MOUTH TWICE A DAY WITH MEALS   montelukast (SINGULAIR) 10 MG tablet TAKE 1 TABLET BY MOUTH EVERYDAY AT BEDTIME   Multiple Minerals-Vitamins (CALCIUM & VIT D3 BONE HEALTH PO) Take by mouth daily.   OneTouch Delica Lancets 351ZMISC Use to check sugar once daily. Dx: E11.9   pantoprazole (PROTONIX) 40 MG tablet Take 1 tablet (40 mg total) by mouth daily.   pioglitazone (ACTOS) 15 MG tablet Take 1 tablet (15 mg total) by mouth daily.   tiZANidine (ZANAFLEX) 2 MG tablet TAKE 1 TABLET BY MOUTH TWICE A DAY AS NEEDED FOR MUSCLE SPASMS   valsartan (DIOVAN) 160 MG tablet Take 1 tablet (160 mg total) by mouth daily.   desonide (DESOWEN) 0.05 % cream apply to affected area twice a day (Patient not taking: Reported on 01/10/2023)   vitamin C (ASCORBIC ACID) 500 MG tablet Take 500 mg by mouth daily. (Patient not taking: Reported on 01/10/2023)   [DISCONTINUED] Peppermint Oil (IBGARD) 90 MG CPCR Take 1 capsule by mouth 3 (three) times daily before meals. (  Patient not taking: Reported on 01/10/2023)   No facility-administered encounter medications on file as of 01/10/2023.    Allergies (verified) Amoxicillin, Biaxin [clarithromycin], Gold-containing drug products, Latex, Statins, and Other   History: Past Medical History:  Diagnosis Date   Asthma    extrinsic   Bilateral carpal tunnel syndrome 11/06/2016   Community acquired pneumonia 12/31/2016   Diabetes mellitus    non-insulin dependent   Foot fracture    History  of deep vein thrombosis (DVT) of lower extremity 1974   after back surgery while on OCP   Hyperlipidemia    Hypertension    Rhinitis    Past Surgical History:  Procedure Laterality Date   BACK SURGERY  2010   hardware - Dr. Tonye Becket SURGERY  1974   scoliosis   BREAST BIOPSY Left 09/2011   Dr. Tamala Julian- neg- FIBROCYSTIC CHANGE    TOTAL ABDOMINAL HYSTERECTOMY  1991   endometriosis   Family History  Problem Relation Age of Onset   Hypertension Mother    Heart disease Maternal Grandmother        pacemaker   Cancer Maternal Grandfather        liver   Leukemia Paternal Grandmother    Breast cancer Neg Hx    Social History   Socioeconomic History   Marital status: Divorced    Spouse name: Not on file   Number of children: Not on file   Years of education: Not on file   Highest education level: Not on file  Occupational History   Not on file  Tobacco Use   Smoking status: Former    Packs/day: 1.00    Years: 44.00    Total pack years: 44.00    Types: Cigarettes    Quit date: 10/17/2009    Years since quitting: 13.2   Smokeless tobacco: Never  Substance and Sexual Activity   Alcohol use: No   Drug use: No   Sexual activity: Not on file  Other Topics Concern   Not on file  Social History Narrative   Lives alone, no pets   Occ: [employer]   Activity: no regular exercise   Diet: good water, fruits/vegetables daily   Social Determinants of Health   Financial Resource Strain: Low Risk  (01/10/2023)   Overall Financial Resource Strain (CARDIA)    Difficulty of Paying Living Expenses: Not hard at all  Food Insecurity: No Food Insecurity (01/10/2023)   Hunger Vital Sign    Worried About Running Out of Food in the Last Year: Never true    Ran Out of Food in the Last Year: Never true  Transportation Needs: No Transportation Needs (01/10/2023)   PRAPARE - Hydrologist (Medical): No    Lack of Transportation (Non-Medical): No  Physical Activity:  Inactive (01/10/2023)   Exercise Vital Sign    Days of Exercise per Week: 0 days    Minutes of Exercise per Session: 0 min  Stress: Stress Concern Present (01/10/2023)   [place]    Feeling of Stress : To some extent  Social Connections: Socially Isolated (01/10/2023)   Social Connection and Isolation Panel [NHANES]    Frequency of Communication with Friends and Family: More than three times a week    Frequency of Social Gatherings with Friends and Family: More than three times a week    Attends Religious Services: Never    Marine scientist or Organizations: No  Attends Archivist Meetings: Never    Marital Status: Divorced    Tobacco Counseling Counseling given: Not Answered   Clinical Intake:  Pre-visit preparation completed: Yes  Pain : No/denies pain     Nutritional Risks: None Diabetes: Yes CBG done?: No Did pt. bring in CBG monitor from home?: No  How often do you need to have someone help you when you read instructions, pamphlets, or other written materials from your doctor or pharmacy?: 1 - Never  Diabetic?yes Nutrition Risk Assessment:  Has the patient had any N/V/D within the last 2 months?  Yes  Does the patient have any non-healing wounds?  No  Has the patient had any unintentional weight loss or weight gain?  No   Diabetes:  Is the patient diabetic?  Yes  If diabetic, was a CBG obtained today?  No  Did the patient bring in their glucometer from home?  No  How often do you monitor your CBG's? Every two days.   Financial Strains and Diabetes Management:  Are you having any financial strains with the device, your supplies or your medication? Yes .  Does the patient want to be seen by Chronic Care Management for management of their diabetes?  No  Would the patient like to be referred to a Nutritionist or for Diabetic Management?  No   Diabetic Exams:  Diabetic Eye  Exam: Completed 12/17/18- states has appt last year.   Pt has been advised about the importance in completing this exam.  Diabetic Foot Exam: Completed 09/10/22. Pt has been advised about the importance in completing this exam.    Interpreter Needed?: No  Information entered by :: Kirke Shaggy, LPN   Activities of Daily Living    01/10/2023    2:32 PM  In your present state of health, do you have any difficulty performing the following activities:  Hearing? 0  Vision? 0  Difficulty concentrating or making decisions? 0  Walking or climbing stairs? 0  Dressing or bathing? 0  Doing errands, shopping? 0  Preparing Food and eating ? N  Using the Toilet? N  In the past six months, have you accidently leaked urine? N  Do you have problems with loss of bowel control? N  Managing your Medications? N  Managing your Finances? N  Housekeeping or managing your Housekeeping? N    Patient Care Team: Ria Bush, MD as PCP - General (Family Medicine)  Indicate any recent Medical Services you may have received from other than [facility] providers in the past year (date may be approximate).     Assessment:   This is a routine wellness examination for Olivia Benton.  Hearing/Vision screen Hearing Screening - Comments:: No aids Vision Screening - Comments:: Wears glasses- Dr. Gloriann Loan  Dietary issues and exercise activities discussed: Current Exercise Habits: The patient does not participate in regular exercise at present   Goals Addressed             This Visit's Progress    DIET - EAT MORE FRUITS AND VEGETABLES         Depression Screen    01/10/2023    2:29 PM 01/09/2022    8:39 AM 12/12/2020    2:13 PM 03/01/2020    2:05 PM 02/10/2019    8:53 AM 02/07/2018    3:00 PM  PHQ 2/9 Scores  PHQ - 2 Score 0 2 1 0 2 0  PHQ- 9 Score 0 '5 3 3 5     '$ Fall Risk  01/10/2023    2:32 PM 01/09/2022    8:20 AM 12/12/2020    2:05 PM  Fall Risk   Falls in the past year? 0 0 0  Number falls in  past yr: 0    Injury with Fall? 0    Risk for fall due to : No Fall Risks    Follow up Falls prevention discussed;Falls evaluation completed      FALL RISK PREVENTION PERTAINING TO THE HOME:  Any stairs in or around the home? No  If so, are there any without handrails? No  Home free of loose throw rugs in walkways, pet beds, electrical cords, etc? Yes  Adequate lighting in your home to reduce risk of falls? Yes   ASSISTIVE DEVICES UTILIZED TO PREVENT FALLS:  Life alert? No  Use of a cane, walker or w/c? No  Grab bars in the bathroom? Yes  Shower chair or bench in shower? No  Elevated toilet seat or a handicapped toilet? Yes    Cognitive Function:        01/10/2023    2:40 PM  6CIT Screen  What Year? 0 points  What month? 0 points  What time? 0 points  Count back from 20 0 points  Months in reverse 0 points  Repeat phrase 2 points  Total Score 2 points    Immunizations Immunization History  Administered Date(s) Administered   Fluad Quad(high Dose 65+) 09/10/2022   Hepatitis A, Adult 01/08/2023   Influenza Split 09/20/2011, 09/29/2012, 09/23/2014   Influenza, High Dose Seasonal PF 09/27/2020, 09/20/2021   Influenza,inj,Quad PF,6+ Mos 08/20/2016, 09/26/2018, 09/19/2019   Influenza-Unspecified 10/18/2013, 08/16/2015, 09/16/2017   Moderna Sars-Covid-2 Vaccination 03/03/2020, 03/31/2020   PNEUMOCOCCAL CONJUGATE-20 01/09/2022   Pneumococcal Polysaccharide-23 09/02/2017    TDAP status: Due, Education has been provided regarding the importance of this vaccine. Advised may receive this vaccine at local pharmacy or Health Dept. Aware to provide a copy of the vaccination record if obtained from local pharmacy or Health Dept. Verbalized acceptance and understanding.  Flu Vaccine status: Up to date  Pneumococcal vaccine status: Up to date  Covid-19 vaccine status: Completed vaccines  Qualifies for Shingles Vaccine? Yes   Zostavax completed No   Shingrix Completed?: No.     Education has been provided regarding the importance of this vaccine. Patient has been advised to call insurance company to determine out of pocket expense if they have not yet received this vaccine. Advised may also receive vaccine at local pharmacy or Health Dept. Verbalized acceptance and understanding.  Screening Tests Health Maintenance  Topic Date Due   DTaP/Tdap/Td (1 - Tdap) Never done   Zoster Vaccines- Shingrix (1 of 2) Never done   OPHTHALMOLOGY EXAM  12/18/2019   DEXA SCAN  Never done   COVID-19 Vaccine (3 - Moderna risk series) 04/28/2020   MAMMOGRAM  12/20/2022   HEMOGLOBIN A1C  03/11/2023   Lung Cancer Screening  03/22/2023   Diabetic kidney evaluation - Urine ACR  04/11/2023   COLONOSCOPY (Pts 45-57yr Insurance coverage will need to be confirmed)  04/29/2023   FOOT EXAM  09/11/2023   Diabetic kidney evaluation - eGFR measurement  10/23/2023   Medicare Annual Wellness (AWV)  01/11/2024   Pneumonia Vaccine 67 Years old  Completed   INFLUENZA VACCINE  Completed   Hepatitis C Screening  Completed   HPV VACCINES  Aged Out    Health Maintenance  Health Maintenance Due  Topic Date Due   DTaP/Tdap/Td (1 - Tdap) Never done  Zoster Vaccines- Shingrix (1 of 2) Never done   OPHTHALMOLOGY EXAM  12/18/2019   DEXA SCAN  Never done   COVID-19 Vaccine (3 - Moderna risk series) 04/28/2020   MAMMOGRAM  12/20/2022    Colorectal cancer screening: Type of screening: Colonoscopy. Completed 04/28/13. Repeat every 10 years- having colonoscopy on Tuesday.   Mammogram status: Completed 12/20/21. Repeat every year- referral sent  Bone Density status: Ordered 01/10/23. Pt provided with contact info and advised to call to schedule appt.  Lung Cancer Screening: (Low Dose CT Chest recommended if Age 16-80 years, 30 pack-year currently smoking OR have quit w/in 15years.) does not qualify.    Additional Screening:  Hepatitis C Screening: does qualify; Completed 12/20/22  Vision  Screening: Recommended annual ophthalmology exams for early detection of glaucoma and other disorders of the eye. Is the patient up to date with their annual eye exam?  Yes  Who is the provider or what is the name of the office in which the patient attends annual eye exams? Dr.Bell If pt is not established with a provider, would they like to be referred to a provider to establish care? No .   Dental Screening: Recommended annual dental exams for proper oral hygiene  Community Resource Referral / Chronic Care Management: CRR required this visit?  No   CCM required this visit?  No      Plan:     I have personally reviewed and noted the following in the patient's chart:   Medical and social history Use of alcohol, tobacco or illicit drugs  Current medications and supplements including opioid prescriptions. Patient is not currently taking opioid prescriptions. Functional ability and status Nutritional status Physical activity Advanced directives List of other physicians Hospitalizations, surgeries, and ER visits in previous 12 months Vitals Screenings to include cognitive, depression, and falls Referrals and appointments  In addition, I have reviewed and discussed with patient certain preventive protocols, quality metrics, and best practice recommendations. A written personalized care plan for preventive services as well as general preventive health recommendations were provided to patient.     Dionisio David, LPN   [id number]   Nurse Notes: none

## 2023-01-10 NOTE — Patient Instructions (Signed)
Ms. Olivia Benton , Thank you for taking time to come for your Medicare Wellness Visit. I appreciate your ongoing commitment to your health goals. Please review the following plan we discussed and let me know if I can assist you in the future.   These are the goals we discussed:  Goals      DIET - EAT MORE FRUITS AND VEGETABLES        This is a list of the screening recommended for you and due dates:  Health Maintenance  Topic Date Due   DTaP/Tdap/Td vaccine (1 - Tdap) Never done   Zoster (Shingles) Vaccine (1 of 2) Never done   Eye exam for diabetics  12/18/2019   DEXA scan (bone density measurement)  Never done   COVID-19 Vaccine (3 - Moderna risk series) 04/28/2020   Mammogram  12/20/2022   Hemoglobin A1C  03/11/2023   Screening for Lung Cancer  03/22/2023   Yearly kidney health urinalysis for diabetes  04/11/2023   Colon Cancer Screening  04/29/2023   Complete foot exam   09/11/2023   Yearly kidney function blood test for diabetes  10/23/2023   Medicare Annual Wellness Visit  01/11/2024   Pneumonia Vaccine  Completed   Flu Shot  Completed   Hepatitis C Screening: USPSTF Recommendation to screen - Ages 18-79 yo.  Completed   HPV Vaccine  Aged Out    Advanced directives: no  Conditions/risks identified: none  Next appointment: Follow up in one year for your annual wellness visit 01/13/24 @ 2:45 pm by phone   Preventive Care 65 Years and Older, Female Preventive care refers to lifestyle choices and visits with your health care provider that can promote health and wellness. What does preventive care include? A yearly physical exam. This is also called an annual well check. Dental exams once or twice a year. Routine eye exams. Ask your health care provider how often you should have your eyes checked. Personal lifestyle choices, including: Daily care of your teeth and gums. Regular physical activity. Eating a healthy diet. Avoiding tobacco and drug use. Limiting alcohol  use. Practicing safe sex. Taking low-dose aspirin every day. Taking vitamin and mineral supplements as recommended by your health care provider. What happens during an annual well check? The services and screenings done by your health care provider during your annual well check will depend on your age, overall health, lifestyle risk factors, and family history of disease. Counseling  Your health care provider may ask you questions about your: Alcohol use. Tobacco use. Drug use. Emotional well-being. Home and relationship well-being. Sexual activity. Eating habits. History of falls. Memory and ability to understand (cognition). Work and work Statistician. Reproductive health. Screening  You may have the following tests or measurements: Height, weight, and BMI. Blood pressure. Lipid and cholesterol levels. These may be checked every 5 years, or more frequently if you are over 85 years old. Skin check. Lung cancer screening. You may have this screening every year starting at age 84 if you have a 30-pack-year history of smoking and currently smoke or have quit within the past 15 years. Fecal occult blood test (FOBT) of the stool. You may have this test every year starting at age 41. Flexible sigmoidoscopy or colonoscopy. You may have a sigmoidoscopy every 5 years or a colonoscopy every 10 years starting at age 80. Hepatitis C blood test. Hepatitis B blood test. Sexually transmitted disease (STD) testing. Diabetes screening. This is done by checking your blood sugar (glucose) after you have  not eaten for a while (fasting). You may have this done every 1-3 years. Bone density scan. This is done to screen for osteoporosis. You may have this done starting at age 38. Mammogram. This may be done every 1-2 years. Talk to your health care provider about how often you should have regular mammograms. Talk with your health care provider about your test results, treatment options, and if necessary,  the need for more tests. Vaccines  Your health care provider may recommend certain vaccines, such as: Influenza vaccine. This is recommended every year. Tetanus, diphtheria, and acellular pertussis (Tdap, Td) vaccine. You may need a Td booster every 10 years. Zoster vaccine. You may need this after age 46. Pneumococcal 13-valent conjugate (PCV13) vaccine. One dose is recommended after age 45. Pneumococcal polysaccharide (PPSV23) vaccine. One dose is recommended after age 73. Talk to your health care provider about which screenings and vaccines you need and how often you need them. This information is not intended to replace advice given to you by your health care provider. Make sure you discuss any questions you have with your health care provider. Document Released: 12/30/2015 Document Revised: 08/22/2016 Document Reviewed: 10/04/2015 Elsevier Interactive Patient Education  2017 [place] Prevention in the Home Falls can cause injuries. They can happen to people of all ages. There are many things you can do to make your home safe and to help prevent falls. What can I do on the outside of my home? Regularly fix the edges of walkways and driveways and fix any cracks. Remove anything that might make you trip as you walk through a door, such as a raised step or threshold. Trim any bushes or trees on the path to your home. Use bright outdoor lighting. Clear any walking paths of anything that might make someone trip, such as rocks or tools. Regularly check to see if handrails are loose or broken. Make sure that both sides of any steps have handrails. Any raised decks and porches should have guardrails on the edges. Have any leaves, snow, or ice cleared regularly. Use sand or salt on walking paths during winter. Clean up any spills in your garage right away. This includes oil or grease spills. What can I do in the bathroom? Use night lights. Install grab bars by the toilet and in the  tub and shower. Do not use towel bars as grab bars. Use non-skid mats or decals in the tub or shower. If you need to sit down in the shower, use a plastic, non-slip stool. Keep the floor dry. Clean up any water that spills on the floor as soon as it happens. Remove soap buildup in the tub or shower regularly. Attach bath mats securely with double-sided non-slip rug tape. Do not have throw rugs and other things on the floor that can make you trip. What can I do in the bedroom? Use night lights. Make sure that you have a light by your bed that is easy to reach. Do not use any sheets or blankets that are too big for your bed. They should not hang down onto the floor. Have a firm chair that has side arms. You can use this for support while you get dressed. Do not have throw rugs and other things on the floor that can make you trip. What can I do in the kitchen? Clean up any spills right away. Avoid walking on wet floors. Keep items that you use a lot in easy-to-reach places. If you need to  reach something above you, use a strong step stool that has a grab bar. Keep electrical cords out of the way. Do not use floor polish or wax that makes floors slippery. If you must use wax, use non-skid floor wax. Do not have throw rugs and other things on the floor that can make you trip. What can I do with my stairs? Do not leave any items on the stairs. Make sure that there are handrails on both sides of the stairs and use them. Fix handrails that are broken or loose. Make sure that handrails are as long as the stairways. Check any carpeting to make sure that it is firmly attached to the stairs. Fix any carpet that is loose or worn. Avoid having throw rugs at the top or bottom of the stairs. If you do have throw rugs, attach them to the floor with carpet tape. Make sure that you have a light switch at the top of the stairs and the bottom of the stairs. If you do not have them, ask someone to add them for  you. What else can I do to help prevent falls? Wear shoes that: Do not have high heels. Have rubber bottoms. Are comfortable and fit you well. Are closed at the toe. Do not wear sandals. If you use a stepladder: Make sure that it is fully opened. Do not climb a closed stepladder. Make sure that both sides of the stepladder are locked into place. Ask someone to hold it for you, if possible. Clearly mark and make sure that you can see: Any grab bars or handrails. First and last steps. Where the edge of each step is. Use tools that help you move around (mobility aids) if they are needed. These include: Canes. Walkers. Scooters. Crutches. Turn on the lights when you go into a dark area. Replace any light bulbs as soon as they burn out. Set up your furniture so you have a clear path. Avoid moving your furniture around. If any of your floors are uneven, fix them. If there are any pets around you, be aware of where they are. Review your medicines with your doctor. Some medicines can make you feel dizzy. This can increase your chance of falling. Ask your doctor what other things that you can do to help prevent falls. This information is not intended to replace advice given to you by your health care provider. Make sure you discuss any questions you have with your health care provider. Document Released: 09/29/2009 Document Revised: 05/10/2016 Document Reviewed: 01/07/2015 Elsevier Interactive Patient Education  2017 [employer].

## 2023-01-14 ENCOUNTER — Encounter: Payer: Self-pay | Admitting: Gastroenterology

## 2023-01-15 ENCOUNTER — Encounter: Payer: Medicare HMO | Admitting: Family Medicine

## 2023-01-15 ENCOUNTER — Encounter
Admission: RE | Disposition: A | Discharge status: Home / Self Care | Payer: Self-pay | Source: Home / Self Care | Attending: Gastroenterology

## 2023-01-15 ENCOUNTER — Ambulatory Visit
Admission: RE | Admit: 2023-01-15 | Discharge: 2023-01-15 | Disposition: A | Discharge status: Home / Self Care | Payer: PPO | Attending: Gastroenterology | Admitting: Gastroenterology

## 2023-01-15 ENCOUNTER — Ambulatory Visit: Payer: PPO | Admitting: Registered Nurse

## 2023-01-15 DIAGNOSIS — D124 Benign neoplasm of descending colon: Secondary | ICD-10-CM | POA: Insufficient documentation

## 2023-01-15 DIAGNOSIS — Z1211 Encounter for screening for malignant neoplasm of colon: Secondary | ICD-10-CM

## 2023-01-15 DIAGNOSIS — E119 Type 2 diabetes mellitus without complications: Secondary | ICD-10-CM | POA: Diagnosis not present

## 2023-01-15 DIAGNOSIS — K449 Diaphragmatic hernia without obstruction or gangrene: Secondary | ICD-10-CM | POA: Insufficient documentation

## 2023-01-15 DIAGNOSIS — I2510 Atherosclerotic heart disease of native coronary artery without angina pectoris: Secondary | ICD-10-CM | POA: Insufficient documentation

## 2023-01-15 DIAGNOSIS — I10 Essential (primary) hypertension: Secondary | ICD-10-CM | POA: Insufficient documentation

## 2023-01-15 DIAGNOSIS — K219 Gastro-esophageal reflux disease without esophagitis: Secondary | ICD-10-CM | POA: Insufficient documentation

## 2023-01-15 DIAGNOSIS — J45909 Unspecified asthma, uncomplicated: Secondary | ICD-10-CM | POA: Diagnosis not present

## 2023-01-15 DIAGNOSIS — R1310 Dysphagia, unspecified: Secondary | ICD-10-CM

## 2023-01-15 DIAGNOSIS — Z87891 Personal history of nicotine dependence: Secondary | ICD-10-CM | POA: Insufficient documentation

## 2023-01-15 DIAGNOSIS — K5730 Diverticulosis of large intestine without perforation or abscess without bleeding: Secondary | ICD-10-CM | POA: Diagnosis not present

## 2023-01-15 DIAGNOSIS — R12 Heartburn: Secondary | ICD-10-CM | POA: Diagnosis not present

## 2023-01-15 DIAGNOSIS — J449 Chronic obstructive pulmonary disease, unspecified: Secondary | ICD-10-CM | POA: Diagnosis not present

## 2023-01-15 DIAGNOSIS — Z8249 Family history of ischemic heart disease and other diseases of the circulatory system: Secondary | ICD-10-CM | POA: Insufficient documentation

## 2023-01-15 DIAGNOSIS — K635 Polyp of colon: Secondary | ICD-10-CM | POA: Diagnosis not present

## 2023-01-15 DIAGNOSIS — Z7984 Long term (current) use of oral hypoglycemic drugs: Secondary | ICD-10-CM | POA: Diagnosis not present

## 2023-01-15 HISTORY — PX: COLONOSCOPY WITH PROPOFOL: SHX5780

## 2023-01-15 HISTORY — PX: ESOPHAGOGASTRODUODENOSCOPY: SHX5428

## 2023-01-15 LAB — GLUCOSE, CAPILLARY: Glucose-Capillary: 120 mg/dL — ABNORMAL HIGH (ref 70–99)

## 2023-01-15 SURGERY — COLONOSCOPY WITH PROPOFOL
Anesthesia: General

## 2023-01-15 MED ORDER — SODIUM CHLORIDE 0.9 % IV SOLN: INTRAVENOUS | Status: DC

## 2023-01-15 MED ORDER — LIDOCAINE HCL (CARDIAC) PF 100 MG/5ML IV SOSY
PREFILLED_SYRINGE | INTRAVENOUS | Status: DC | PRN
  Administered 2023-01-15: 40 mg via INTRAVENOUS

## 2023-01-15 MED ORDER — PROPOFOL 500 MG/50ML IV EMUL
INTRAVENOUS | Status: DC | PRN
  Administered 2023-01-15: 140 ug/kg/min via INTRAVENOUS

## 2023-01-15 MED ORDER — PROPOFOL 1000 MG/100ML IV EMUL
INTRAVENOUS | Status: AC
  Filled 2023-01-15: qty 100

## 2023-01-15 MED ORDER — PROPOFOL 10 MG/ML IV BOLUS
INTRAVENOUS | Status: DC | PRN
  Administered 2023-01-15: 20 mg via INTRAVENOUS
  Administered 2023-01-15: 30 mg via INTRAVENOUS
  Administered 2023-01-15: 70 mg via INTRAVENOUS

## 2023-01-15 NOTE — Op Note (Addendum)
[facility] Patient Name: Olivia Benton Procedure Date: 01/15/2023 9:58 AM MRN: [id number] Account #: [id number] Date of Birth: April 13, 1956 Admit Type: Outpatient Age: 67 Room: [facility] ENDO ROOM 4 Gender: Female Note Status: Finalized Instrument Name: Jasper Riling [id number] Procedure:             Colonoscopy Indications:           Screening for colorectal malignant neoplasm Providers:             Lucilla Lame MD, MD Medicines:             Propofol per Anesthesia Complications:         No immediate complications. Procedure:             Pre-Anesthesia Assessment:                        - Prior to the procedure, a History and Physical was                         performed, and patient medications and allergies were                         reviewed. The patient's tolerance of previous                         anesthesia was also reviewed. The risks and benefits                         of the procedure and the sedation options and risks                         were discussed with the patient. All questions were                         answered, and informed consent was obtained. Prior                         Anticoagulants: The patient has taken no anticoagulant                         or antiplatelet agents. ASA Grade Assessment: II - A                         patient with mild systemic disease. After reviewing                         the risks and benefits, the patient was deemed in                         satisfactory condition to undergo the procedure.                        After obtaining informed consent, the colonoscope was                         passed under direct vision. Throughout the procedure,                         the patient's blood  pressure, pulse, and oxygen                         saturations were monitored continuously. The                         Colonoscope was introduced through the anus and                         advanced to the  the cecum, identified by appendiceal                         orifice and ileocecal valve. The colonoscopy was                         performed without difficulty. The patient tolerated                         the procedure well. The quality of the bowel                         preparation was excellent. Findings:      The perianal and digital rectal examinations were normal.      Two sessile polyps were found in the descending colon. The polyps were 2       to 3 mm in size. These polyps were removed with a cold biopsy forceps.       Resection and retrieval were complete.      Multiple small-mouthed diverticula were found in the sigmoid colon. Impression:            - Two 2 to 3 mm polyps in the descending colon,                         removed with a cold biopsy forceps. Resected and                         retrieved.                        - Diverticulosis in the sigmoid colon. Recommendation:        - Discharge patient to home.                        - Resume previous diet.                        - Continue present medications.                        - Await pathology results.                        - If the pathology report reveals adenomatous tissue,                         then repeat the colonoscopy for surveillance in 7                         years. Procedure Code(s):     --- Professional ---  45380, Colonoscopy, flexible; with biopsy, single or                         multiple Diagnosis Code(s):     --- Professional ---                        Z12.11, Encounter for screening for malignant neoplasm                         of colon                        D12.4, Benign neoplasm of descending colon CPT copyright 2022 American Medical Association. All rights reserved. The codes documented in this report are preliminary and upon coder review may  be revised to meet current compliance requirements. Lucilla Lame MD, MD 01/15/2023 10:37:05 AM This report has been  signed electronically. Number of Addenda: 0 Note Initiated On: 01/15/2023 9:58 AM Scope Withdrawal Time: 0 hours 8 minutes 12 seconds  Total Procedure Duration: 0 hours 12 minutes 12 seconds  Estimated Blood Loss:  Estimated blood loss: none.      [facility]

## 2023-01-15 NOTE — Op Note (Signed)
[facility] Patient Name: Olivia Benton Procedure Date: 01/15/2023 9:59 AM MRN: [id number] Account #: [id number] Date of Birth: 06-29-1956 Admit Type: Outpatient Age: 67 Room: [facility] ENDO ROOM 4 Gender: Female Note Status: Finalized Instrument Name: Altamese Cabal Endoscope [id number] Procedure:             Upper GI endoscopy Indications:           Heartburn Providers:             Lucilla Lame MD, MD Medicines:             Propofol per Anesthesia Complications:         No immediate complications. Procedure:             Pre-Anesthesia Assessment:                        - Prior to the procedure, a History and Physical was                         performed, and patient medications and allergies were                         reviewed. The patient's tolerance of previous                         anesthesia was also reviewed. The risks and benefits                         of the procedure and the sedation options and risks                         were discussed with the patient. All questions were                         answered, and informed consent was obtained. Prior                         Anticoagulants: The patient has taken no anticoagulant                         or antiplatelet agents. ASA Grade Assessment: II - A                         patient with mild systemic disease. After reviewing                         the risks and benefits, the patient was deemed in                         satisfactory condition to undergo the procedure.                        After obtaining informed consent, the endoscope was                         passed under direct vision. Throughout the procedure,                         the patient's blood pressure,  pulse, and oxygen                         saturations were monitored continuously. The Endoscope                         was introduced through the mouth, and advanced to the                         second part of duodenum.  The upper GI endoscopy was                         accomplished without difficulty. The patient tolerated                         the procedure well. Findings:      A small hiatal hernia was present.      Biopsies were taken with a cold forceps in the distal esophagus for       histology.      The stomach was normal.      The examined duodenum was normal. Impression:            - Small hiatal hernia.                        - Normal stomach.                        - Normal examined duodenum.                        - Biopsies were taken with a cold forceps for                         histology in the distal esophagus. Recommendation:        - Discharge patient to home.                        - Resume previous diet.                        - Continue present medications.                        - Await pathology results.                        - Perform a colonoscopy today. Procedure Code(s):     --- Professional ---                        [id number], Esophagogastroduodenoscopy, flexible,                         transoral; with biopsy, single or multiple Diagnosis Code(s):     --- Professional ---                        R12, Heartburn CPT copyright 2022 American Medical Association. All rights reserved. The codes documented in this report are preliminary and upon coder review may  be revised to meet current compliance requirements. Lucilla Lame MD, MD 01/15/2023 10:20:37 AM  This report has been signed electronically. Number of Addenda: 0 Note Initiated On: 01/15/2023 9:59 AM Estimated Blood Loss:  Estimated blood loss: none.      [facility]

## 2023-01-15 NOTE — Anesthesia Preprocedure Evaluation (Signed)
Anesthesia Evaluation  Patient identified by MRN, date of birth, ID band Patient awake    Reviewed: Allergy & Precautions, NPO status , Patient's Chart, lab work & pertinent test results  History of Anesthesia Complications Negative for: history of anesthetic complications  Airway Mallampati: II  TM Distance: >3 FB Neck ROM: Full    Dental no notable dental hx. (+) Teeth Intact   Pulmonary asthma , neg sleep apnea, COPD, Patient abstained from smoking.Not current smoker, former smoker   Pulmonary exam normal breath sounds clear to auscultation       Cardiovascular Exercise Tolerance: Good METShypertension, + CAD  (-) Past MI (-) dysrhythmias  Rhythm:Regular Rate:Normal - Systolic murmurs    Neuro/Psych negative neurological ROS  negative psych ROS   GI/Hepatic ,GERD  ,,(+)     (-) substance abuse    Endo/Other  diabetes, Oral Hypoglycemic Agents    Renal/GU negative Renal ROS     Musculoskeletal   Abdominal   Peds  Hematology   Anesthesia Other Findings Past Medical History: No date: Asthma     Comment:  extrinsic 11/06/2016: Bilateral carpal tunnel syndrome 12/31/2016: Community acquired pneumonia No date: Diabetes mellitus     Comment:  non-insulin dependent No date: Foot fracture 1974: History of deep vein thrombosis (DVT) of lower extremity     Comment:  after back surgery while on OCP No date: Hyperlipidemia No date: Hypertension No date: Rhinitis  Reproductive/Obstetrics                              Anesthesia Physical Anesthesia Plan  ASA: 2  Anesthesia Plan: General   Post-op Pain Management: Minimal or no pain anticipated   Induction: Intravenous  PONV Risk Score and Plan: 3 and Propofol infusion, TIVA and Ondansetron  Airway Management Planned: Nasal Cannula  Additional Equipment: None  Intra-op Plan:   Post-operative Plan:   Informed Consent: I have  reviewed the patients History and Physical, chart, labs and discussed the procedure including the risks, benefits and alternatives for the proposed anesthesia with the patient or authorized representative who has indicated his/her understanding and acceptance.     Dental advisory given  Plan Discussed with: CRNA and Surgeon  Anesthesia Plan Comments: (Discussed risks of anesthesia with patient, including possibility of difficulty with spontaneous ventilation under anesthesia necessitating airway intervention, PONV, and rare risks such as cardiac or respiratory or neurological events, and allergic reactions. Discussed the role of CRNA in patient's perioperative care. Patient understands.)         Anesthesia Quick Evaluation

## 2023-01-15 NOTE — Transfer of Care (Signed)
Immediate Anesthesia Transfer of Care Note  Patient: Olivia Benton  Procedure(s) Performed: COLONOSCOPY WITH PROPOFOL ESOPHAGOGASTRODUODENOSCOPY (EGD)  Patient Location: PACU  Anesthesia Type:General  Level of Consciousness: awake, alert , and oriented  Airway & Oxygen Therapy: Patient Spontanous Breathing  Post-op Assessment: Report given to RN and Post -op Vital signs reviewed and stable  Post vital signs: Reviewed and stable  Last Vitals:  Vitals Value Taken Time  BP 125/47 01/15/23 1038  Temp 36.4 C 01/15/23 1037  Pulse 73 01/15/23 1040  Resp 24 01/15/23 1040  SpO2 97 % 01/15/23 1040  Vitals shown include unvalidated device data.  Last Pain:  Vitals:   01/15/23 1037  TempSrc: Temporal  PainSc: 0-No pain         Complications: No notable events documented.

## 2023-01-15 NOTE — H&P (Signed)
Lucilla Lame, MD [facility] [address], [place], [place] [address] Phone:[phone] Fax : [phone]  Primary Care Physician:  Ria Bush, MD Primary Gastroenterologist:  Dr. Allen Norris  Pre-Procedure History & Physical: HPI:  Olivia Benton is a 67 y.o. female is here for an endoscopy and colonoscopy.   Past Medical History:  Diagnosis Date   Asthma    extrinsic   Bilateral carpal tunnel syndrome 11/06/2016   Community acquired pneumonia 12/31/2016   Diabetes mellitus    non-insulin dependent   Foot fracture    History of deep vein thrombosis (DVT) of lower extremity 1974   after back surgery while on OCP   Hyperlipidemia    Hypertension    Rhinitis     Past Surgical History:  Procedure Laterality Date   BACK SURGERY  2010   hardware - Dr. Tonye Becket SURGERY  1974   scoliosis   BREAST BIOPSY Left 09/2011   Dr. Tamala Julian- neg- FIBROCYSTIC CHANGE    TOTAL ABDOMINAL HYSTERECTOMY  1991   endometriosis    Prior to Admission medications   Medication Sig Start Date End Date Taking? Authorizing Provider  hydrochlorothiazide (MICROZIDE) 12.5 MG capsule TAKE 1 CAPSULE BY MOUTH EVERY DAY 01/10/22  Yes Ria Bush, MD  lovastatin (MEVACOR) 20 MG tablet Take 1 tablet (20 mg total) by mouth once a week. 01/10/22  Yes Ria Bush, MD  metFORMIN (GLUCOPHAGE) 500 MG tablet TAKE 1 TABLET BY MOUTH TWICE A DAY WITH MEALS 11/19/22  Yes Ria Bush, MD  pantoprazole (PROTONIX) 40 MG tablet Take 1 tablet (40 mg total) by mouth daily. 12/20/22  Yes Lucilla Lame, MD  pioglitazone (ACTOS) 15 MG tablet Take 1 tablet (15 mg total) by mouth daily. 10/22/22  Yes Ria Bush, MD  valsartan (DIOVAN) 160 MG tablet Take 1 tablet (160 mg total) by mouth daily. 01/10/22  Yes Ria Bush, MD  Blood Glucose Monitoring Suppl (ONE TOUCH ULTRA MINI) w/Device KIT Use as instructed to check blood sugar once daily. 02/07/21   Ria Bush, MD  Cholecalciferol (VITAMIN D)  50 MCG (2000 UT) CAPS Take 1 capsule (2,000 Units total) by mouth daily. 01/11/22   Ria Bush, MD  desonide (DESOWEN) 0.05 % cream apply to affected area twice a day Patient not taking: Reported on 01/10/2023 01/08/20   Ria Bush, MD  FLUoxetine (PROZAC) 20 MG capsule TAKE 1 CAPSULE BY MOUTH EVERY DAY 09/27/22   Ria Bush, MD  fluticasone [facility] (2-Rh)) 50 MCG/ACT nasal spray USE 2 SPRAYS IN BOTH  NOSTRILS DAILY 05/24/20   Ria Bush, MD  gabapentin (NEURONTIN) 100 MG capsule TAKE 1-2 CAPSULES (100-200 MG TOTAL) BY MOUTH AT BEDTIME. 12/05/22   Ria Bush, MD  glucose blood (ONETOUCH ULTRA) test strip USE TO CHECK SUGAR ONCE DAILY. DX: E11.9 08/14/22   Ria Bush, MD  levocetirizine (XYZAL) 5 MG tablet Take 5 mg by mouth every evening.    [provider]  montelukast (SINGULAIR) 10 MG tablet TAKE 1 TABLET BY MOUTH EVERYDAY AT BEDTIME 01/01/23   Ria Bush, MD  Multiple Minerals-Vitamins (CALCIUM & VIT D3 BONE HEALTH PO) Take by mouth daily.    [provider]  OneTouch Delica Lancets 34V MISC Use to check sugar once daily. Dx: E11.9 02/07/21   Ria Bush, MD  tiZANidine (ZANAFLEX) 2 MG tablet TAKE 1 TABLET BY MOUTH TWICE A DAY AS NEEDED FOR MUSCLE SPASMS 08/07/22   Ria Bush, MD  vitamin C (ASCORBIC ACID) 500 MG tablet Take 500 mg  by mouth daily. Patient not taking: Reported on 01/10/2023    [provider]    Allergies as of 12/20/2022 - Review Complete 12/20/2022  Allergen Reaction Noted   Amoxicillin  03/05/2017   Biaxin [clarithromycin]  10/18/2011   Gold-containing drug products  04/21/2012   Latex  10/18/2016   Statins  10/30/2013   Other Hives and Rash 10/30/2012    Family History  Problem Relation Age of Onset   Hypertension Mother    Heart disease Maternal Grandmother        pacemaker   Cancer Maternal Grandfather        liver   Leukemia Paternal Grandmother    Breast cancer Neg Hx     Social  History   Socioeconomic History   Marital status: Divorced    Spouse name: Not on file   Number of children: Not on file   Years of education: Not on file   Highest education level: Not on file  Occupational History   Not on file  Tobacco Use   Smoking status: Former    Packs/day: 1.00    Years: 44.00    Total pack years: 44.00    Types: Cigarettes    Quit date: 10/17/2009    Years since quitting: 13.2   Smokeless tobacco: Never  Vaping Use   Vaping Use: Never used  Substance and Sexual Activity   Alcohol use: No   Drug use: No   Sexual activity: Not on file  Other Topics Concern   Not on file  Social History Narrative   Lives alone, no pets   Occ: [employer]   Activity: no regular exercise   Diet: good water, fruits/vegetables daily   Social Determinants of Health   Financial Resource Strain: Low Risk  (01/10/2023)   Overall Financial Resource Strain (CARDIA)    Difficulty of Paying Living Expenses: Not hard at all  Food Insecurity: No Food Insecurity (01/10/2023)   Hunger Vital Sign    Worried About Running Out of Food in the Last Year: Never true    Ran Out of Food in the Last Year: Never true  Transportation Needs: No Transportation Needs (01/10/2023)   PRAPARE - Hydrologist (Medical): No    Lack of Transportation (Non-Medical): No  Physical Activity: Inactive (01/10/2023)   Exercise Vital Sign    Days of Exercise per Week: 0 days    Minutes of Exercise per Session: 0 min  Stress: Stress Concern Present (01/10/2023)   [place]    Feeling of Stress : To some extent  Social Connections: Socially Isolated (01/10/2023)   Social Connection and Isolation Panel [NHANES]    Frequency of Communication with Friends and Family: More than three times a week    Frequency of Social Gatherings with Friends and Family: More than three times a week    Attends Religious Services: Never     Marine scientist or Organizations: No    Attends Archivist Meetings: Never    Marital Status: Divorced  Human resources officer Violence: Not At Risk (01/10/2023)   Humiliation, Afraid, Rape, and Kick questionnaire    Fear of Current or Ex-Partner: No    Emotionally Abused: No    Physically Abused: No    Sexually Abused: No    Review of Systems: See HPI, otherwise negative ROS  Physical Exam: BP (!) 150/75   Pulse 76   Temp 97.7 F (36.5  C) (Temporal)   Resp 16   Ht '4\' 10"'$  (1.473 m)   Wt 68.5 kg   SpO2 100%   BMI 31.56 kg/m  General:   Alert,  pleasant and cooperative in NAD Head:  Normocephalic and atraumatic. Neck:  Supple; no masses or thyromegaly. Lungs:  Clear throughout to auscultation.    Heart:  Regular rate and rhythm. Abdomen:  Soft, nontender and nondistended. Normal bowel sounds, without guarding, and without rebound.   Neurologic:  Alert and  oriented x4;  grossly normal neurologically.  Impression/Plan: Olivia Benton is here for an endoscopy and colonoscopy to be performed for GERD and screening  Risks, benefits, limitations, and alternatives regarding  endoscopy and colonoscopy have been reviewed with the patient.  Questions have been answered.  All parties agreeable.   Lucilla Lame, MD  01/15/2023, 10:12 AM

## 2023-01-15 NOTE — Anesthesia Postprocedure Evaluation (Signed)
Anesthesia Post Note  Patient: Olivia Benton  Procedure(s) Performed: COLONOSCOPY WITH PROPOFOL ESOPHAGOGASTRODUODENOSCOPY (EGD)  Patient location during evaluation: Endoscopy Anesthesia Type: General Level of consciousness: awake and alert Pain management: pain level controlled Vital Signs Assessment: post-procedure vital signs reviewed and stable Respiratory status: spontaneous breathing, nonlabored ventilation, respiratory function stable and patient connected to nasal cannula oxygen Cardiovascular status: blood pressure returned to baseline and stable Postop Assessment: no apparent nausea or vomiting Anesthetic complications: no   No notable events documented.   Last Vitals:  Vitals:   01/15/23 1037 01/15/23 1047  BP: (!) 125/47 128/62  Pulse:    Resp:    Temp: (!) 36.4 C   SpO2:      Last Pain:  Vitals:   01/15/23 1057  TempSrc:   PainSc: 0-No pain                 Arita Miss

## 2023-01-16 LAB — SURGICAL PATHOLOGY

## 2023-01-17 ENCOUNTER — Encounter: Payer: Self-pay | Admitting: Gastroenterology

## 2023-01-19 ENCOUNTER — Encounter: Payer: Self-pay | Admitting: Gastroenterology

## 2023-01-20 ENCOUNTER — Encounter: Payer: Self-pay | Admitting: Family Medicine

## 2023-01-28 ENCOUNTER — Other Ambulatory Visit: Payer: Self-pay | Admitting: Family Medicine

## 2023-01-28 DIAGNOSIS — E1169 Type 2 diabetes mellitus with other specified complication: Secondary | ICD-10-CM

## 2023-02-04 ENCOUNTER — Other Ambulatory Visit: Payer: Self-pay | Admitting: Family Medicine

## 2023-02-04 NOTE — Telephone Encounter (Signed)
Tizanidine Last filled: 11/03/22, #180 Last OV:  10/22/22, GI prob Next OV:  03/18/23, CPE

## 2023-02-05 ENCOUNTER — Ambulatory Visit: Payer: PPO

## 2023-02-06 ENCOUNTER — Other Ambulatory Visit: Payer: Self-pay | Admitting: Family Medicine

## 2023-02-06 DIAGNOSIS — E1169 Type 2 diabetes mellitus with other specified complication: Secondary | ICD-10-CM

## 2023-02-10 ENCOUNTER — Other Ambulatory Visit: Payer: Self-pay | Admitting: Acute Care

## 2023-02-10 DIAGNOSIS — Z87891 Personal history of nicotine dependence: Secondary | ICD-10-CM

## 2023-02-10 DIAGNOSIS — Z122 Encounter for screening for malignant neoplasm of respiratory organs: Secondary | ICD-10-CM

## 2023-02-12 ENCOUNTER — Ambulatory Visit (INDEPENDENT_AMBULATORY_CARE_PROVIDER_SITE_OTHER): Payer: PPO

## 2023-02-12 DIAGNOSIS — Z23 Encounter for immunization: Secondary | ICD-10-CM

## 2023-02-12 NOTE — Progress Notes (Signed)
Per orders of Dr. Allen Norris, send to Dr Vicente Males in his absence injection of Hep A 2 of 3 given in Left deltoid by Lurlean Nanny.  Patient tolerated injection well.

## 2023-02-17 ENCOUNTER — Other Ambulatory Visit: Payer: Self-pay | Admitting: Family Medicine

## 2023-02-17 DIAGNOSIS — E1169 Type 2 diabetes mellitus with other specified complication: Secondary | ICD-10-CM

## 2023-02-17 DIAGNOSIS — I10 Essential (primary) hypertension: Secondary | ICD-10-CM

## 2023-02-26 DIAGNOSIS — H35371 Puckering of macula, right eye: Secondary | ICD-10-CM | POA: Diagnosis not present

## 2023-02-26 DIAGNOSIS — H2513 Age-related nuclear cataract, bilateral: Secondary | ICD-10-CM | POA: Diagnosis not present

## 2023-02-26 DIAGNOSIS — E119 Type 2 diabetes mellitus without complications: Secondary | ICD-10-CM | POA: Diagnosis not present

## 2023-02-26 DIAGNOSIS — H43812 Vitreous degeneration, left eye: Secondary | ICD-10-CM | POA: Diagnosis not present

## 2023-02-26 DIAGNOSIS — H43821 Vitreomacular adhesion, right eye: Secondary | ICD-10-CM | POA: Diagnosis not present

## 2023-02-26 LAB — HM DIABETES EYE EXAM

## 2023-02-27 ENCOUNTER — Encounter: Payer: Self-pay | Admitting: Family Medicine

## 2023-03-08 ENCOUNTER — Other Ambulatory Visit (INDEPENDENT_AMBULATORY_CARE_PROVIDER_SITE_OTHER): Payer: PPO

## 2023-03-08 DIAGNOSIS — E785 Hyperlipidemia, unspecified: Secondary | ICD-10-CM | POA: Diagnosis not present

## 2023-03-08 DIAGNOSIS — E559 Vitamin D deficiency, unspecified: Secondary | ICD-10-CM | POA: Diagnosis not present

## 2023-03-08 DIAGNOSIS — E1169 Type 2 diabetes mellitus with other specified complication: Secondary | ICD-10-CM | POA: Diagnosis not present

## 2023-03-08 LAB — BASIC METABOLIC PANEL
BUN: 18 mg/dL (ref 6–23)
CO2: 31 mEq/L (ref 19–32)
Calcium: 9.9 mg/dL (ref 8.4–10.5)
Chloride: 101 mEq/L (ref 96–112)
Creatinine, Ser: 0.87 mg/dL (ref 0.40–1.20)
GFR: 68.7 mL/min (ref >60.00)
Glucose, Bld: 96 mg/dL (ref 70–99)
Potassium: 4.1 mEq/L (ref 3.5–5.1)
Sodium: 142 mEq/L (ref 135–145)

## 2023-03-08 LAB — MICROALBUMIN / CREATININE URINE RATIO
Creatinine,U: 149.9 mg/dL
Microalb Creat Ratio: 1.4 mg/g (ref 0.0–30.0)
Microalb, Ur: 2.1 mg/dL — ABNORMAL HIGH (ref 0.0–1.9)

## 2023-03-08 LAB — LDL CHOLESTEROL, DIRECT: Direct LDL: 103 mg/dL

## 2023-03-08 LAB — LIPID PANEL
Cholesterol: 171 mg/dL (ref 0–200)
HDL: 31.3 mg/dL — ABNORMAL LOW (ref >39.00)
NonHDL: 140.04
Total CHOL/HDL Ratio: 5
Triglycerides: 211 mg/dL — ABNORMAL HIGH (ref 0.0–149.0)
VLDL: 42.2 mg/dL — ABNORMAL HIGH (ref 0.0–40.0)

## 2023-03-08 LAB — HEMOGLOBIN A1C: Hgb A1c MFr Bld: 6.9 % — ABNORMAL HIGH (ref 4.6–6.5)

## 2023-03-08 LAB — VITAMIN D 25 HYDROXY (VIT D DEFICIENCY, FRACTURES): VITD: 30.72 ng/mL (ref 30.00–100.00)

## 2023-03-10 ENCOUNTER — Other Ambulatory Visit: Payer: Self-pay | Admitting: Family Medicine

## 2023-03-11 NOTE — Telephone Encounter (Signed)
Refill request lovastatin Last refill 01/10/22 #13/3 Last office visit 10/22/22 Upcoming appointment 03/18/23 See allergy/contraindication

## 2023-03-15 ENCOUNTER — Encounter: Payer: Medicare HMO | Admitting: Family Medicine

## 2023-03-15 ENCOUNTER — Other Ambulatory Visit: Payer: Self-pay | Admitting: Family Medicine

## 2023-03-15 DIAGNOSIS — Z636 Dependent relative needing care at home: Secondary | ICD-10-CM

## 2023-03-18 ENCOUNTER — Encounter: Payer: Self-pay | Admitting: Family Medicine

## 2023-03-18 ENCOUNTER — Ambulatory Visit (INDEPENDENT_AMBULATORY_CARE_PROVIDER_SITE_OTHER): Payer: PPO | Admitting: Family Medicine

## 2023-03-18 VITALS — BP 128/62 | HR 68 | Temp 98.4°F | Ht <= 58 in | Wt 155.0 lb

## 2023-03-18 DIAGNOSIS — E559 Vitamin D deficiency, unspecified: Secondary | ICD-10-CM | POA: Diagnosis not present

## 2023-03-18 DIAGNOSIS — Z636 Dependent relative needing care at home: Secondary | ICD-10-CM

## 2023-03-18 DIAGNOSIS — I2510 Atherosclerotic heart disease of native coronary artery without angina pectoris: Secondary | ICD-10-CM

## 2023-03-18 DIAGNOSIS — J439 Emphysema, unspecified: Secondary | ICD-10-CM

## 2023-03-18 DIAGNOSIS — K219 Gastro-esophageal reflux disease without esophagitis: Secondary | ICD-10-CM | POA: Diagnosis not present

## 2023-03-18 DIAGNOSIS — I10 Essential (primary) hypertension: Secondary | ICD-10-CM

## 2023-03-18 DIAGNOSIS — E1169 Type 2 diabetes mellitus with other specified complication: Secondary | ICD-10-CM | POA: Diagnosis not present

## 2023-03-18 DIAGNOSIS — Z0000 Encounter for general adult medical examination without abnormal findings: Secondary | ICD-10-CM

## 2023-03-18 DIAGNOSIS — K760 Fatty (change of) liver, not elsewhere classified: Secondary | ICD-10-CM

## 2023-03-18 DIAGNOSIS — G720 Drug-induced myopathy: Secondary | ICD-10-CM | POA: Diagnosis not present

## 2023-03-18 DIAGNOSIS — R21 Rash and other nonspecific skin eruption: Secondary | ICD-10-CM

## 2023-03-18 DIAGNOSIS — E669 Obesity, unspecified: Secondary | ICD-10-CM

## 2023-03-18 DIAGNOSIS — T466X5A Adverse effect of antihyperlipidemic and antiarteriosclerotic drugs, initial encounter: Secondary | ICD-10-CM

## 2023-03-18 DIAGNOSIS — R0789 Other chest pain: Secondary | ICD-10-CM | POA: Diagnosis not present

## 2023-03-18 DIAGNOSIS — E785 Hyperlipidemia, unspecified: Secondary | ICD-10-CM

## 2023-03-18 DIAGNOSIS — E66811 Obesity, class 1: Secondary | ICD-10-CM

## 2023-03-18 MED ORDER — PIOGLITAZONE HCL 15 MG PO TABS: 15.0000 mg | ORAL_TABLET | Freq: Every day | ORAL | 4 refills | Status: DC

## 2023-03-18 MED ORDER — LOVASTATIN 20 MG PO TABS: 20.0000 mg | ORAL_TABLET | ORAL | 4 refills | Status: DC

## 2023-03-18 MED ORDER — FLUOXETINE HCL 20 MG PO CAPS: 20.0000 mg | ORAL_CAPSULE | Freq: Every day | ORAL | 4 refills | Status: DC

## 2023-03-18 MED ORDER — METFORMIN HCL 500 MG PO TABS: 500.0000 mg | ORAL_TABLET | Freq: Two times a day (BID) | ORAL | 4 refills | Status: DC

## 2023-03-18 MED ORDER — GABAPENTIN 100 MG PO CAPS: 100.0000 mg | ORAL_CAPSULE | Freq: Every day | ORAL | 6 refills | Status: DC

## 2023-03-18 MED ORDER — VALSARTAN 160 MG PO TABS: 160.0000 mg | ORAL_TABLET | Freq: Every day | ORAL | 4 refills | Status: DC

## 2023-03-18 MED ORDER — HYDROCHLOROTHIAZIDE 12.5 MG PO CAPS: 12.5000 mg | ORAL_CAPSULE | Freq: Every day | ORAL | 4 refills | Status: DC

## 2023-03-18 MED ORDER — MONTELUKAST SODIUM 10 MG PO TABS: ORAL_TABLET | ORAL | 4 refills | Status: DC

## 2023-03-18 NOTE — Assessment & Plan Note (Signed)
Levels deteriorated despite once weekly lovastatin - will increase to twice weekly. H/o other statin intolerance (crestor, lipitor). The 10-year ASCVD risk score (Arnett DK, et al., 2019) is: 31.2%   Values used to calculate the score:     Age: 67 years     Sex: Female     Is Non-Hispanic African American: No     Diabetic: Yes     Tobacco smoker: Yes     Systolic Blood Pressure: 0000000 mmHg     Is BP treated: Yes     HDL Cholesterol: 31.3 mg/dL     Total Cholesterol: 171 mg/dL

## 2023-03-18 NOTE — Assessment & Plan Note (Addendum)
Overall stable on omeprazole 40mg  daily and pepcid 20mg  nightly Recent reassuring EGD with small HH

## 2023-03-18 NOTE — Patient Instructions (Addendum)
Call to schedule mammogram and bone density scan at your convenience: [facility] [phone]. EKG today and we will refer you to heart doctor for further evaluation Rockey Situ).  If interested, check with pharmacy about new 2 shot shingles series (shingrix) and RSV shot.  Increase lovastatin to twice weekly Return in 6 months for follow up visit with labs

## 2023-03-18 NOTE — Assessment & Plan Note (Signed)
Preventative protocols reviewed and updated unless pt declined. Discussed healthy diet and lifestyle.  

## 2023-03-18 NOTE — Assessment & Plan Note (Signed)
Continues lovastatin which she's tolerating better - increase to twice weekly.

## 2023-03-18 NOTE — Assessment & Plan Note (Signed)
Chronic, stable on vit D 2000 IU daily.

## 2023-03-18 NOTE — Assessment & Plan Note (Signed)
Continue to encourage healthy diet and lifestyle choices to affect sustainable weight loss.  °

## 2023-03-18 NOTE — Assessment & Plan Note (Signed)
Continue actos.  Intolerant to trulicity.  Receiving Hep A/B immunization series through GI.

## 2023-03-18 NOTE — Assessment & Plan Note (Signed)
Chronic, stable on hctz and valsartan.  °

## 2023-03-18 NOTE — Assessment & Plan Note (Signed)
Noted on lung cancer screening CT.  She does endorse intermittent chest discomfort that is not exertional.  Will refer to cardiology for further risk stratification.

## 2023-03-18 NOTE — Assessment & Plan Note (Signed)
Incidental on imaging. Pt asxs

## 2023-03-18 NOTE — Assessment & Plan Note (Signed)
Notes intermittent chest discomfort in setting of LAD calcification on recent lung cancer screening CT.  Update EKG today.  Will refer to cardiology for further risk stratification.

## 2023-03-18 NOTE — Progress Notes (Signed)
Patient ID: Olivia Benton, female    DOB: March 24, 1956, 67 y.o.   MRN: [id number]  This visit was conducted in person.  BP 128/62   Pulse 68   Temp 98.4 F (36.9 C) (Temporal)   Ht 4' 9.5" (1.461 m)   Wt 155 lb (70.3 kg)   SpO2 98%   BMI 32.96 kg/m    CC: CPE Subjective:   HPI: Olivia Benton is a 67 y.o. female presenting on 03/18/2023 for Annual Exam (Lower legs have been breaking out for 3 weeks. Not itching, just red splotches. )   Saw health advisor 12/2022 for medicare wellness visit. Note reviewed.   Hearing Screening   500Hz  1000Hz  2000Hz  4000Hz   Right ear 25 40 20 20  Left ear 20 40 20 20   Vision Screening   Right eye Left eye Both eyes  Without correction     With correction 20/30 20/30 20/25     Flowsheet Row Office Visit from 03/18/2023 in [facility]  PHQ-2 Total Score 0          03/18/2023   12:17 PM 01/10/2023    2:32 PM 01/09/2022    8:20 AM 12/12/2020    2:05 PM  Fall Risk   Falls in the past year? 0 0 0 0  Number falls in past yr: 0 0    Injury with Fall? 0 0    Risk for fall due to : No Fall Risks No Fall Risks    Follow up Falls evaluation completed Falls prevention discussed;Falls evaluation completed      New skin rash to legs - no new lotions, shampoos or soaps or detergents. No new medicines, supplements. No pain, itching, asxs. No dry skin  Caregiver stress - no benefit on wellbutrin 2021 - switched to prozac 20mg  with benefit.   Diabetic with statin intolerance (leg cramping, muscle pains) - takes lovastatin once weekly. Trulicity caused intolerable GI side effects. Now on actos 15mg  daily.    GERD - continues omeprazole 40mg  daily with pepcid 20mg  nightly.  This week had recurrent heartburn symptoms.  Saw GI - RUQ US showed hepatic steatosis.   Lung cancer screen showed LAD CAD. Endorses occ chest pain once a week - described as dull ache - left of sternum. Also notes she stays very short  winded with mild exertion.   Preventative: COLONOSCOPY WITH PROPOFOL 01/15/2023 - TA x2, diverticulosis, rpt 7 yrs Allen Norris, Darren, MD) ESOPHAGOGASTRODUODENOSCOPY 01/15/2023 - small HH otherwise WNL Allen Norris, Darren, MD) [facility] 12/2021 [facility] - rpt pending  Well woman exam - pap WNL 2013. S/p complete hysterectomy with BSO 1991 (67yo) for endometriosis. She did have HRT for a year. H/o DVT as teenager when on OCP. Early surgical menopause.  DEXA - DUE. # provided to call and schedule Lung cancer screening - ~30 PY history. yearly lung CT, first 03/2022 showing LAD CAD and emphysema  Flu shot yearly  Covid vaccine - Moderna 02/2020, 03/2020, no  boosters Tetanus shot - declines  Pneumovax - 08/2017, Prevnar 20 - 12/2021 Shingrix - discussed would be interested  RSV - discussed Advanced directive discussion - has not set up. Discussed. HCPOA would be local daughter [name]). Advanced directive packet previously provided.  Seat belt use discussed  Sunscreen use discussed, no changing moles  Ex smoker - quit 2010, prior ~30 PY hx. Children still smoke  Alcohol - none  Dentist - due, needs to find new  dentist that takes medicare Eye exam yearly Bowel - no significant constipation  Bladder - no incontinence    Lives alone, no pets Occ: [facility] Activity: no regular exercise - she does have stationary bicycle but hasn't used Diet: good water, fruits/vegetables daily     Relevant past medical, surgical, family and social history reviewed and updated as indicated. Interim medical history since our last visit reviewed. Allergies and medications reviewed and updated. Outpatient Medications Prior to Visit  Medication Sig Dispense Refill   Blood Glucose Monitoring Suppl (ONE TOUCH ULTRA MINI) w/Device KIT Use as instructed to check blood sugar once daily. 1 kit 0   Cholecalciferol (VITAMIN D) 50 MCG (2000 UT) CAPS Take 1 capsule (2,000 Units total) by mouth daily. 30 capsule     fluticasone (FLONASE) 50 MCG/ACT nasal spray USE 2 SPRAYS IN BOTH  NOSTRILS DAILY 48 g 2   glucose blood (ONETOUCH ULTRA) test strip Use as instructed to check blood sugar once a day 100 strip 3   levocetirizine (XYZAL) 5 MG tablet Take 5 mg by mouth every evening.     OneTouch Delica Lancets 99991111 MISC Use to check sugar once daily. Dx: E11.9 100 each 3   pantoprazole (PROTONIX) 40 MG tablet Take 1 tablet (40 mg total) by mouth daily. 30 tablet 5   tiZANidine (ZANAFLEX) 2 MG tablet TAKE 1 TABLET BY MOUTH TWICE A DAY AS NEEDED FOR MUSCLE SPASM 180 tablet 1   FLUoxetine (PROZAC) 20 MG capsule TAKE 1 CAPSULE BY MOUTH EVERY DAY 90 capsule 1   gabapentin (NEURONTIN) 100 MG capsule TAKE 1-2 CAPSULES (100-200 MG TOTAL) BY MOUTH AT BEDTIME. 60 capsule 6   hydrochlorothiazide (MICROZIDE) 12.5 MG capsule TAKE 1 CAPSULE BY MOUTH EVERY DAY 90 capsule 0   lovastatin (MEVACOR) 20 MG tablet TAKE 1 TABLET (20 MG TOTAL) BY MOUTH WEEKLY 13 tablet 0   metFORMIN (GLUCOPHAGE) 500 MG tablet TAKE 1 TABLET BY MOUTH TWICE A DAY WITH FOOD 180 tablet 0   montelukast (SINGULAIR) 10 MG tablet TAKE 1 TABLET BY MOUTH EVERYDAY AT BEDTIME 90 tablet 0   pioglitazone (ACTOS) 15 MG tablet TAKE 1 TABLET (15 MG TOTAL) BY MOUTH DAILY. 90 tablet 0   valsartan (DIOVAN) 160 MG tablet TAKE 1 TABLET BY MOUTH EVERY DAY 90 tablet 3   Multiple Minerals-Vitamins (CALCIUM & VIT D3 BONE HEALTH PO) Take by mouth daily. (Patient not taking: Reported on 03/18/2023)     desonide (DESOWEN) 0.05 % cream apply to affected area twice a day (Patient not taking: Reported on 01/10/2023) 30 g 0   vitamin C (ASCORBIC ACID) 500 MG tablet Take 500 mg by mouth daily. (Patient not taking: Reported on 01/10/2023)     No facility-administered medications prior to visit.     Per HPI unless specifically indicated in ROS section below Review of Systems  Constitutional:  Negative for activity change, appetite change, chills, fatigue, fever and unexpected weight change.   HENT:  Negative for hearing loss.   Eyes:  Negative for visual disturbance.  Respiratory:  Positive for cough (food related) and shortness of breath. Negative for chest tightness and wheezing.   Cardiovascular:  Positive for chest pain. Negative for palpitations and leg swelling.  Gastrointestinal:  Positive for abdominal pain. Negative for abdominal distention, blood in stool, constipation, diarrhea, nausea and vomiting.  Genitourinary:  Negative for difficulty urinating and hematuria.  Musculoskeletal:  Negative for arthralgias, myalgias and neck pain.  Skin:  Negative for rash.  Neurological:  Negative  for dizziness, seizures, syncope and headaches.  Hematological:  Negative for adenopathy. Does not bruise/bleed easily.  Psychiatric/Behavioral:  Negative for dysphoric mood. The patient is not nervous/anxious.     Objective:  BP 128/62   Pulse 68   Temp 98.4 F (36.9 C) (Temporal)   Ht 4' 9.5" (1.461 m)   Wt 155 lb (70.3 kg)   SpO2 98%   BMI 32.96 kg/m   Wt Readings from Last 3 Encounters:  03/18/23 155 lb (70.3 kg)  01/15/23 151 lb (68.5 kg)  01/10/23 155 lb (70.3 kg)      Physical Exam Vitals and nursing note reviewed.  Constitutional:      Appearance: Normal appearance. She is not ill-appearing.  HENT:     Head: Normocephalic and atraumatic.     Right Ear: Tympanic membrane, ear canal and external ear normal. There is no impacted cerumen.     Left Ear: Tympanic membrane, ear canal and external ear normal. There is no impacted cerumen.     Mouth/Throat:     Mouth: Mucous membranes are moist.     Pharynx: Oropharynx is clear. No oropharyngeal exudate or posterior oropharyngeal erythema.  Eyes:     General:        Right eye: No discharge.        Left eye: No discharge.     Extraocular Movements: Extraocular movements intact.     Conjunctiva/sclera: Conjunctivae normal.     Pupils: Pupils are equal, round, and reactive to light.  Neck:     Thyroid: No thyroid mass  or thyromegaly.     Vascular: No carotid bruit.  Cardiovascular:     Rate and Rhythm: Normal rate and regular rhythm.     Pulses: Normal pulses.     Heart sounds: Normal heart sounds. No murmur heard. Pulmonary:     Effort: Pulmonary effort is normal. No respiratory distress.     Breath sounds: Normal breath sounds. No wheezing, rhonchi or rales.  Abdominal:     General: Bowel sounds are normal. There is no distension.     Palpations: Abdomen is soft. There is no mass.     Tenderness: There is no abdominal tenderness. There is no guarding or rebound.     Hernia: No hernia is present.  Musculoskeletal:     Cervical back: Normal range of motion and neck supple. No rigidity.     Right lower leg: No edema.     Left lower leg: No edema.  Lymphadenopathy:     Cervical: No cervical adenopathy.  Skin:    General: Skin is warm and dry.     Findings: Rash present. Rash is papular.     Comments: Fine papular rash to bilateral anterior legs and thighs, none on posterior of legs  Neurological:     General: No focal deficit present.     Mental Status: She is alert. Mental status is at baseline.  Psychiatric:        Mood and Affect: Mood normal.        Behavior: Behavior normal.       Results for orders placed or performed in visit on 99991111  Basic metabolic panel  Result Value Ref Range   Sodium 142 135 - 145 mEq/L   Potassium 4.1 3.5 - 5.1 mEq/L   Chloride 101 96 - 112 mEq/L   CO2 31 19 - 32 mEq/L   Glucose, Bld 96 70 - 99 mg/dL   BUN 18 6 - 23 mg/dL  Creatinine, Ser 0.87 0.40 - 1.20 mg/dL   GFR 68.70 >60.00 mL/min   Calcium 9.9 8.4 - 10.5 mg/dL  VITAMIN D 25 Hydroxy (Vit-D Deficiency, Fractures)  Result Value Ref Range   VITD 30.72 30.00 - 100.00 ng/mL  Lipid panel  Result Value Ref Range   Cholesterol 171 0 - 200 mg/dL   Triglycerides 211.0 (H) 0.0 - 149.0 mg/dL   HDL 31.30 (L) >39.00 mg/dL   VLDL 42.2 (H) 0.0 - 40.0 mg/dL   Total CHOL/HDL Ratio 5    NonHDL 140.04    Microalbumin / creatinine urine ratio  Result Value Ref Range   Microalb, Ur 2.1 (H) 0.0 - 1.9 mg/dL   Creatinine,U 149.9 mg/dL   Microalb Creat Ratio 1.4 0.0 - 30.0 mg/g  Hemoglobin A1c  Result Value Ref Range   Hgb A1c MFr Bld 6.9 (H) 4.6 - 6.5 %  LDL cholesterol, direct  Result Value Ref Range   Direct LDL 103.0 mg/dL   EKG - NSR rate 60s, normal axis, intervals, no hypertrophy or acute ST/T changes, poor R wave progression anteriorly.   Assessment & Plan:   Problem List Items Addressed This Visit     Health maintenance examination - Primary (Chronic)    Preventative protocols reviewed and updated unless pt declined. Discussed healthy diet and lifestyle.       Type 2 diabetes mellitus with other specified complication    Chronic, stable. Continue current regimen of metformin and actos.  Discussed could consider trial ozempic vs mounjaro.       Relevant Medications   lovastatin (MEVACOR) 20 MG tablet   metFORMIN (GLUCOPHAGE) 500 MG tablet   pioglitazone (ACTOS) 15 MG tablet   valsartan (DIOVAN) 160 MG tablet   Hypertension    Chronic, stable on hctz and valsartan      Relevant Medications   hydrochlorothiazide (MICROZIDE) 12.5 MG capsule   lovastatin (MEVACOR) 20 MG tablet   valsartan (DIOVAN) 160 MG tablet   Other Relevant Orders   EKG 12-Lead (Completed)   GERD (gastroesophageal reflux disease)    Overall stable on omeprazole 40mg  daily and pepcid 20mg  nightly Recent reassuring EGD with small HH       Obesity, Class I, BMI 30-34.9    Continue to encourage healthy diet and lifestyle choices to affect sustainable weight loss.       Caregiver stress   Relevant Medications   FLUoxetine (PROZAC) 20 MG capsule   Dyslipidemia associated with type 2 diabetes mellitus    Levels deteriorated despite once weekly lovastatin - will increase to twice weekly. H/o other statin intolerance (crestor, lipitor). The 10-year ASCVD risk score (Arnett DK, et al., 2019) is:  31.2%   Values used to calculate the score:     Age: 66 years     Sex: Female     Is Non-Hispanic African American: No     Diabetic: Yes     Tobacco smoker: Yes     Systolic Blood Pressure: 0000000 mmHg     Is BP treated: Yes     HDL Cholesterol: 31.3 mg/dL     Total Cholesterol: 171 mg/dL       Relevant Medications   lovastatin (MEVACOR) 20 MG tablet   metFORMIN (GLUCOPHAGE) 500 MG tablet   pioglitazone (ACTOS) 15 MG tablet   valsartan (DIOVAN) 160 MG tablet   Vitamin D deficiency    Chronic, stable on vit D 2000 IU daily.       Statin myopathy  Continues lovastatin which she's tolerating better - increase to twice weekly.       CAD (coronary artery disease)    Noted on lung cancer screening CT.  She does endorse intermittent chest discomfort that is not exertional.  Will refer to cardiology for further risk stratification.       Relevant Medications   hydrochlorothiazide (MICROZIDE) 12.5 MG capsule   lovastatin (MEVACOR) 20 MG tablet   valsartan (DIOVAN) 160 MG tablet   Other Relevant Orders   Ambulatory referral to Cardiology   COPD (chronic obstructive pulmonary disease)    Incidental on imaging. Pt asxs      Relevant Medications   montelukast (SINGULAIR) 10 MG tablet   Fatty liver    Continue actos.  Intolerant to trulicity.  Receiving Hep A/B immunization series through GI.       Chest discomfort    Notes intermittent chest discomfort in setting of LAD calcification on recent lung cancer screening CT.  Update EKG today.  Will refer to cardiology for further risk stratification.       Relevant Orders   Ambulatory referral to Cardiology   Skin rash    Of unclear cause. Pt asxs. Will continue to monitor        Meds ordered this encounter  Medications   FLUoxetine (PROZAC) 20 MG capsule    Sig: Take 1 capsule (20 mg total) by mouth daily.    Dispense:  90 capsule    Refill:  4   gabapentin (NEURONTIN) 100 MG capsule    Sig: Take 1-2 capsules  (100-200 mg total) by mouth at bedtime.    Dispense:  60 capsule    Refill:  6   hydrochlorothiazide (MICROZIDE) 12.5 MG capsule    Sig: Take 1 capsule (12.5 mg total) by mouth daily.    Dispense:  90 capsule    Refill:  4   lovastatin (MEVACOR) 20 MG tablet    Sig: Take 1 tablet (20 mg total) by mouth 2 (two) times a week.    Dispense:  26 tablet    Refill:  4   metFORMIN (GLUCOPHAGE) 500 MG tablet    Sig: Take 1 tablet (500 mg total) by mouth 2 (two) times daily with a meal.    Dispense:  180 tablet    Refill:  4   montelukast (SINGULAIR) 10 MG tablet    Sig: TAKE 1 TABLET BY MOUTH EVERYDAY AT BEDTIME    Dispense:  90 tablet    Refill:  4   pioglitazone (ACTOS) 15 MG tablet    Sig: Take 1 tablet (15 mg total) by mouth daily.    Dispense:  90 tablet    Refill:  4   valsartan (DIOVAN) 160 MG tablet    Sig: Take 1 tablet (160 mg total) by mouth daily.    Dispense:  90 tablet    Refill:  4    Orders Placed This Encounter  Procedures   Ambulatory referral to Cardiology    Referral Priority:   Routine    Referral Type:   Consultation    Referral Reason:   Specialty Services Required    Number of Visits Requested:   1   EKG 12-Lead    Patient Instructions  Call to schedule mammogram and bone density scan at your convenience: [facility] at [facility] [phone]. EKG today and we will refer you to heart doctor for further evaluation Rockey Situ).  If interested, check with pharmacy about new 2 shot  shingles series (shingrix) and RSV shot.  Increase lovastatin to twice weekly Return in 6 months for follow up visit with labs  Follow up plan: Return in about 6 months (around 09/17/2023) for follow up visit.  Ria Bush, MD

## 2023-03-18 NOTE — Assessment & Plan Note (Signed)
Of unclear cause. Pt asxs. Will continue to monitor

## 2023-03-18 NOTE — Assessment & Plan Note (Addendum)
Chronic, stable. Continue current regimen of metformin and actos.  Discussed could consider trial ozempic vs mounjaro.

## 2023-03-21 ENCOUNTER — Ambulatory Visit: Payer: Medicare HMO | Admitting: Gastroenterology

## 2023-03-25 ENCOUNTER — Ambulatory Visit
Admission: RE | Admit: 2023-03-25 | Discharge: 2023-03-25 | Disposition: A | Discharge status: Home / Self Care | Payer: PPO | Source: Ambulatory Visit | Attending: Family Medicine | Admitting: Family Medicine

## 2023-03-25 DIAGNOSIS — Z87891 Personal history of nicotine dependence: Secondary | ICD-10-CM | POA: Diagnosis not present

## 2023-03-25 DIAGNOSIS — J8410 Pulmonary fibrosis, unspecified: Secondary | ICD-10-CM | POA: Diagnosis not present

## 2023-03-25 DIAGNOSIS — I2510 Atherosclerotic heart disease of native coronary artery without angina pectoris: Secondary | ICD-10-CM | POA: Diagnosis not present

## 2023-03-25 DIAGNOSIS — Z122 Encounter for screening for malignant neoplasm of respiratory organs: Secondary | ICD-10-CM

## 2023-03-27 ENCOUNTER — Other Ambulatory Visit: Payer: Self-pay

## 2023-03-27 DIAGNOSIS — Z87891 Personal history of nicotine dependence: Secondary | ICD-10-CM

## 2023-04-02 ENCOUNTER — Ambulatory Visit (INDEPENDENT_AMBULATORY_CARE_PROVIDER_SITE_OTHER): Payer: PPO | Admitting: Internal Medicine

## 2023-04-02 ENCOUNTER — Encounter: Payer: Self-pay | Admitting: Internal Medicine

## 2023-04-02 VITALS — BP 122/76 | HR 74 | Temp 97.2°F | Ht <= 58 in | Wt 155.0 lb

## 2023-04-02 DIAGNOSIS — L03032 Cellulitis of left toe: Secondary | ICD-10-CM | POA: Diagnosis not present

## 2023-04-02 MED ORDER — DOXYCYCLINE HYCLATE 100 MG PO TABS
100.0000 mg | ORAL_TABLET | Freq: Two times a day (BID) | ORAL | 0 refills | Status: DC
Start: 2023-04-02 — End: 2023-06-04

## 2023-04-02 NOTE — Progress Notes (Signed)
Subjective:    Patient ID: Olivia Benton, female    DOB: May 14, 1956, 67 y.o.   MRN: [id number]  HPI Here due to left great toe pain  Left great toe with shooting pain Started 5 days ago No injury Thinks she may have ingrown nail Put neosporin yesterday--seems some better No ulceration  Current Outpatient Medications on File Prior to Visit  Medication Sig Dispense Refill   Blood Glucose Monitoring Suppl (ONE TOUCH ULTRA MINI) w/Device KIT Use as instructed to check blood sugar once daily. 1 kit 0   Cholecalciferol (VITAMIN D) 50 MCG (2000 UT) CAPS Take 1 capsule (2,000 Units total) by mouth daily. 30 capsule    FLUoxetine (PROZAC) 20 MG capsule Take 1 capsule (20 mg total) by mouth daily. 90 capsule 4   fluticasone (FLONASE) 50 MCG/ACT nasal spray USE 2 SPRAYS IN BOTH  NOSTRILS DAILY 48 g 2   gabapentin (NEURONTIN) 100 MG capsule Take 1-2 capsules (100-200 mg total) by mouth at bedtime. 60 capsule 6   glucose blood (ONETOUCH ULTRA) test strip Use as instructed to check blood sugar once a day 100 strip 3   hydrochlorothiazide (MICROZIDE) 12.5 MG capsule Take 1 capsule (12.5 mg total) by mouth daily. 90 capsule 4   levocetirizine (XYZAL) 5 MG tablet Take 5 mg by mouth every evening.     lovastatin (MEVACOR) 20 MG tablet Take 1 tablet (20 mg total) by mouth 2 (two) times a week. 26 tablet 4   metFORMIN (GLUCOPHAGE) 500 MG tablet Take 1 tablet (500 mg total) by mouth 2 (two) times daily with a meal. 180 tablet 4   montelukast (SINGULAIR) 10 MG tablet TAKE 1 TABLET BY MOUTH EVERYDAY AT BEDTIME 90 tablet 4   Multiple Minerals-Vitamins (CALCIUM & VIT D3 BONE HEALTH PO) Take by mouth daily.     OneTouch Delica Lancets 33G MISC Use to check sugar once daily. Dx: E11.9 100 each 3   pantoprazole (PROTONIX) 40 MG tablet Take 1 tablet (40 mg total) by mouth daily. 30 tablet 5   pioglitazone (ACTOS) 15 MG tablet Take 1 tablet (15 mg total) by mouth daily. 90 tablet 4   tiZANidine (ZANAFLEX) 2  MG tablet TAKE 1 TABLET BY MOUTH TWICE A DAY AS NEEDED FOR MUSCLE SPASM 180 tablet 1   valsartan (DIOVAN) 160 MG tablet Take 1 tablet (160 mg total) by mouth daily. 90 tablet 4   No current facility-administered medications on file prior to visit.    Allergies  Allergen Reactions   Amoxicillin    Biaxin [Clarithromycin]    Gold-Containing Drug Products    Latex    Statins     Crestor, lipitor   Other Hives and Rash    Rubber    Past Medical History:  Diagnosis Date   Asthma    extrinsic   Bilateral carpal tunnel syndrome 11/06/2016   Community acquired pneumonia 12/31/2016   Diabetes mellitus    non-insulin dependent   Foot fracture    History of deep vein thrombosis (DVT) of lower extremity 1974   after back surgery while on OCP   Hyperlipidemia    Hypertension    Rhinitis     Past Surgical History:  Procedure Laterality Date   BACK SURGERY  2010   hardware - Dr. Lysbeth Galas SURGERY  1974   scoliosis   BREAST BIOPSY Left 09/2011   Dr. Katrinka Blazing- neg- FIBROCYSTIC CHANGE    COLONOSCOPY WITH PROPOFOL N/A 01/15/2023   TA x2,  diverticulosis, rpt 7 yrs Servando Snare, Darren, MD)   ESOPHAGOGASTRODUODENOSCOPY N/A 01/15/2023   small HH otherwise WNL Servando Snare, Darren, MD)   TOTAL ABDOMINAL HYSTERECTOMY  1991   endometriosis    Family History  Problem Relation Age of Onset   Hypertension Mother    Heart disease Maternal Grandmother        pacemaker   Cancer Maternal Grandfather        liver   Leukemia Paternal Grandmother    Breast cancer Neg Hx     Social History   Socioeconomic History   Marital status: Divorced    Spouse name: Not on file   Number of children: Not on file   Years of education: Not on file   Highest education level: Associate degree: occupational, Scientist, product/process development, or vocational program  Occupational History   Not on file  Tobacco Use   Smoking status: Former    Packs/day: 1.00    Years: 44.00    Additional pack years: 0.00    Total pack years: 44.00     Types: Cigarettes    Quit date: 10/17/2009    Years since quitting: 13.4   Smokeless tobacco: Never  Vaping Use   Vaping Use: Never used  Substance and Sexual Activity   Alcohol use: No   Drug use: No   Sexual activity: Not on file  Other Topics Concern   Not on file  Social History Narrative   Lives alone, no pets   Occ: [employer]   Activity: no regular exercise   Diet: good water, fruits/vegetables daily   Social Determinants of Health   Financial Resource Strain: Low Risk  (04/01/2023)   Overall Financial Resource Strain (CARDIA)    Difficulty of Paying Living Expenses: Not hard at all  Food Insecurity: No Food Insecurity (04/01/2023)   Hunger Vital Sign    Worried About Running Out of Food in the Last Year: Never true    Ran Out of Food in the Last Year: Never true  Transportation Needs: No Transportation Needs (04/01/2023)   PRAPARE - Administrator, Civil Service (Medical): No    Lack of Transportation (Non-Medical): No  Physical Activity: Inactive (04/01/2023)   Exercise Vital Sign    Days of Exercise per Week: 0 days    Minutes of Exercise per Session: 0 min  Stress: No Stress Concern Present (04/01/2023)   [employer] of Occupational Health - Occupational Stress Questionnaire    Feeling of Stress : Not at all  Recent Concern: Stress - Stress Concern Present (01/10/2023)   [employer] of Occupational Health - Occupational Stress Questionnaire    Feeling of Stress : To some extent  Social Connections: Unknown (04/01/2023)   Social Connection and Isolation Panel [NHANES]    Frequency of Communication with Friends and Family: Twice a week    Frequency of Social Gatherings with Friends and Family: Patient declined    Attends Religious Services: Patient declined    Database administrator or Organizations: No    Attends Engineer, structural: Never    Marital Status: Divorced  Recent Concern: Social Connections - Socially Isolated (01/10/2023)    Social Connection and Isolation Panel [NHANES]    Frequency of Communication with Friends and Family: More than three times a week    Frequency of Social Gatherings with Friends and Family: More than three times a week    Attends Religious Services: Never    Database administrator or Organizations: No  Attends Banker Meetings: Never    Marital Status: Divorced  Catering manager Violence: Not At Risk (01/10/2023)   Humiliation, Afraid, Rape, and Kick questionnaire    Fear of Current or Ex-Partner: No    Emotionally Abused: No    Physically Abused: No    Sexually Abused: No   Review of Systems No fever No chills or sweats    Objective:   Physical Exam Constitutional:      Appearance: Normal appearance.  Skin:    Comments: Redness, warmth and tenderness just in distal left great toe White pustule at distal lateral border of nail No clear ingrowing  Neurological:     Mental Status: She is alert.            Assessment & Plan:

## 2023-04-02 NOTE — Assessment & Plan Note (Signed)
Could have mild ingrowing distally but not too evident---so no resection now (is going back to pedicurist soon---she will trim it back) Discussed warm soaks (consider adding epsom salts) Doxy 100 bid x 7 days

## 2023-05-28 ENCOUNTER — Other Ambulatory Visit: Payer: Self-pay | Admitting: Gastroenterology

## 2023-05-28 ENCOUNTER — Encounter: Payer: Self-pay | Admitting: Radiology

## 2023-05-28 ENCOUNTER — Ambulatory Visit
Admission: RE | Admit: 2023-05-28 | Discharge: 2023-05-28 | Disposition: A | Discharge status: Home / Self Care | Payer: PPO | Source: Ambulatory Visit | Attending: Family Medicine | Admitting: Family Medicine

## 2023-05-28 DIAGNOSIS — Z1231 Encounter for screening mammogram for malignant neoplasm of breast: Secondary | ICD-10-CM | POA: Insufficient documentation

## 2023-05-28 DIAGNOSIS — M85852 Other specified disorders of bone density and structure, left thigh: Secondary | ICD-10-CM | POA: Diagnosis not present

## 2023-05-28 DIAGNOSIS — Z780 Asymptomatic menopausal state: Secondary | ICD-10-CM | POA: Diagnosis not present

## 2023-05-29 DIAGNOSIS — H2513 Age-related nuclear cataract, bilateral: Secondary | ICD-10-CM | POA: Diagnosis not present

## 2023-05-29 DIAGNOSIS — H43821 Vitreomacular adhesion, right eye: Secondary | ICD-10-CM | POA: Diagnosis not present

## 2023-05-29 DIAGNOSIS — H35371 Puckering of macula, right eye: Secondary | ICD-10-CM | POA: Diagnosis not present

## 2023-05-29 DIAGNOSIS — H43812 Vitreous degeneration, left eye: Secondary | ICD-10-CM | POA: Diagnosis not present

## 2023-05-29 DIAGNOSIS — E119 Type 2 diabetes mellitus without complications: Secondary | ICD-10-CM | POA: Diagnosis not present

## 2023-05-30 ENCOUNTER — Encounter: Payer: Self-pay | Admitting: Family Medicine

## 2023-05-30 DIAGNOSIS — M8580 Other specified disorders of bone density and structure, unspecified site: Secondary | ICD-10-CM | POA: Insufficient documentation

## 2023-06-03 NOTE — Progress Notes (Unsigned)
Cardiology Office Note  Date:  06/04/2023   ID:  MCKINLY COPPENS, DOB [birth year], MRN [id number]  PCP:  Eustaquio Boyden, MD   Chief Complaint  Patient presents with   New Patient (Initial Visit)    Ref by Dr. Sharen Hones for abnormal EKG. Patient c/o shortness of breath with walking a short distance, rapid heart beats and has occasional chest pain. Medications reviewed by the patient verbally.     HPI:  Ms. Anayeli Feliciano is a 67 year old woman with past medical history of Hypertension  Hyperlipidemia, statin intolerance Crestor, Lipitor Diabetes type 2 Obesity GERD Quit smoking 2010 Who presents by referral from Dr. Sharen Hones for chest discomfort, coronary calcification  In follow-up today reports having some shortness of breath on exertion, remote chest pain on exertion Significant stress taking care of mother who has medical issues, primary caretaker  CT scan chest images pulled up and reviewed with her Moderate coronary calcium on CT scan, LAD, little in LCX and RCA Minimal in the aorta  Activity limited by back and leg pain No regular exercise  Total cholesterol 171 Tolerating atorvastatin twice a week  EKG personally reviewed by myself on todays visit Normal sinus rhythm rate 70 bpm no significant ST-T wave changes  PMH:   has a past medical history of Asthma, Bilateral carpal tunnel syndrome (11/06/2016), Community acquired pneumonia (12/31/2016), Diabetes mellitus, Foot fracture, History of deep vein thrombosis (DVT) of lower extremity (1974), Hyperlipidemia, Hypertension, and Rhinitis.  PSH:    Past Surgical History:  Procedure Laterality Date   BACK SURGERY  2010   hardware - Dr. Lysbeth Galas SURGERY  1974   scoliosis   BREAST BIOPSY Left 09/2011   Dr. Katrinka Blazing- neg- FIBROCYSTIC CHANGE    COLONOSCOPY WITH PROPOFOL N/A 01/15/2023   TA x2, diverticulosis, rpt 7 yrs Servando Snare, Darren, MD)   ESOPHAGOGASTRODUODENOSCOPY N/A 01/15/2023   small HH otherwise WNL  Servando Snare, Darren, MD)   TOTAL ABDOMINAL HYSTERECTOMY  1991   endometriosis    Current Outpatient Medications  Medication Sig Dispense Refill   Blood Glucose Monitoring Suppl (ONE TOUCH ULTRA MINI) w/Device KIT Use as instructed to check blood sugar once daily. 1 kit 0   Cholecalciferol (VITAMIN D) 50 MCG (2000 UT) CAPS Take 1 capsule (2,000 Units total) by mouth daily. 30 capsule    FLUoxetine (PROZAC) 20 MG capsule Take 1 capsule (20 mg total) by mouth daily. 90 capsule 4   fluticasone (FLONASE) 50 MCG/ACT nasal spray USE 2 SPRAYS IN BOTH  NOSTRILS DAILY 48 g 2   gabapentin (NEURONTIN) 100 MG capsule Take 1-2 capsules (100-200 mg total) by mouth at bedtime. 60 capsule 6   glucose blood (ONETOUCH ULTRA) test strip Use as instructed to check blood sugar once a day 100 strip 3   hydrochlorothiazide (MICROZIDE) 12.5 MG capsule Take 1 capsule (12.5 mg total) by mouth daily. 90 capsule 4   levocetirizine (XYZAL) 5 MG tablet Take 5 mg by mouth every evening.     lovastatin (MEVACOR) 20 MG tablet Take 1 tablet (20 mg total) by mouth 2 (two) times a week. 26 tablet 4   metFORMIN (GLUCOPHAGE) 500 MG tablet Take 1 tablet (500 mg total) by mouth 2 (two) times daily with a meal. 180 tablet 4   montelukast (SINGULAIR) 10 MG tablet TAKE 1 TABLET BY MOUTH EVERYDAY AT BEDTIME 90 tablet 4   Multiple Minerals-Vitamins (CALCIUM & VIT D3 BONE HEALTH PO) Take by mouth daily.     OneTouch Delica  Lancets 33G MISC Use to check sugar once daily. Dx: E11.9 100 each 3   pantoprazole (PROTONIX) 40 MG tablet TAKE 1 TABLET BY MOUTH EVERY DAY 90 tablet 1   pioglitazone (ACTOS) 15 MG tablet Take 1 tablet (15 mg total) by mouth daily. 90 tablet 4   tiZANidine (ZANAFLEX) 2 MG tablet TAKE 1 TABLET BY MOUTH TWICE A DAY AS NEEDED FOR MUSCLE SPASM 180 tablet 1   valsartan (DIOVAN) 160 MG tablet Take 1 tablet (160 mg total) by mouth daily. 90 tablet 4   No current facility-administered medications for this visit.     Allergies:    Amoxicillin, Biaxin [clarithromycin], Gold-containing drug products, Latex, Statins, and Other   Social History:  The patient  reports that she quit smoking about 13 years ago. Her smoking use included cigarettes. She has a 44.00 pack-year smoking history. She has never used smokeless tobacco. She reports that she does not drink alcohol and does not use drugs.   Family History:   family history includes Alzheimer's disease in her father; Cancer in her brother and maternal grandfather; Heart disease in her maternal grandmother; Heart murmur in her father; Hypertension in her mother; Leukemia in her paternal grandmother.    Review of Systems: Review of Systems  Constitutional: Negative.   HENT: Negative.    Respiratory:  Positive for shortness of breath.   Cardiovascular:  Positive for chest pain.  Gastrointestinal: Negative.   Musculoskeletal: Negative.   Neurological: Negative.   Psychiatric/Behavioral: Negative.    All other systems reviewed and are negative.    PHYSICAL EXAM: VS:  BP 108/60 (BP Location: Right Arm, Patient Position: Sitting, Cuff Size: Normal)   Pulse 70   Ht 4\' 10"  (1.473 m)   Wt 155 lb 4 oz (70.4 kg)   SpO2 98%   BMI 32.45 kg/m  , BMI Body mass index is 32.45 kg/m. GEN: Well nourished, well developed, in no acute distress HEENT: normal Neck: no JVD, carotid bruits, or masses Cardiac: RRR; no murmurs, rubs, or gallops,no edema  Respiratory:  clear to auscultation bilaterally, normal work of breathing GI: soft, nontender, nondistended, + BS MS: no deformity or atrophy Skin: warm and dry, no rash Neuro:  Strength and sensation are intact Psych: euthymic mood, full affect   Recent Labs: 10/22/2022: Hemoglobin 12.2; Platelets 297.0 12/20/2022: ALT 40 03/08/2023: BUN 18; Creatinine, Ser 0.87; Potassium 4.1; Sodium 142    Lipid Panel Lab Results  Component Value Date   CHOL 171 03/08/2023   HDL 31.30 (L) 03/08/2023   LDLCALC 83 04/10/2022   TRIG 211.0  (H) 03/08/2023      Wt Readings from Last 3 Encounters:  06/04/23 155 lb 4 oz (70.4 kg)  04/02/23 155 lb (70.3 kg)  03/18/23 155 lb (70.3 kg)       ASSESSMENT AND PLAN:  Problem List Items Addressed This Visit       Cardiology Problems   Hypertension   CAD (coronary artery disease) - Primary     Other   Type 2 diabetes mellitus with other specified complication (HCC)   Dyslipidemia associated with type 2 diabetes mellitus (HCC)   Chest pain/angina Chest pain symptoms concerning for angina, presenting on exertion Risk factors including diabetes, hyperlipidemia Coronary calcification noted in the LAD We have recommended additional workup, she prefers cardiac CTA Recommended she hold her blood pressure medications morning of the scan to make room for metoprolol tartrate 100 mg x 1  Diabetes type 2 We have encouraged continued  exercise, careful diet management in an effort to lose weight.  Hyperlipidemia Will recommend she start Zetia, continue statin twice a week  Essential hypertension Blood pressure is well controlled on today's visit. No changes made to the medications.    Total encounter time more than 60 minutes  Greater than 50% was spent in counseling and coordination of care with the patient    Signed, Dossie Arbour, M.D., Ph.D. [facility] [place], [place] [phone]

## 2023-06-04 ENCOUNTER — Encounter: Payer: Self-pay | Admitting: Cardiovascular Disease

## 2023-06-04 ENCOUNTER — Ambulatory Visit: Payer: PPO | Attending: Cardiovascular Disease | Admitting: Cardiovascular Disease

## 2023-06-04 ENCOUNTER — Other Ambulatory Visit: Payer: Self-pay | Admitting: Family Medicine

## 2023-06-04 VITALS — BP 108/60 | HR 70 | Ht <= 58 in | Wt 155.2 lb

## 2023-06-04 DIAGNOSIS — I25118 Atherosclerotic heart disease of native coronary artery with other forms of angina pectoris: Secondary | ICD-10-CM

## 2023-06-04 DIAGNOSIS — Z7984 Long term (current) use of oral hypoglycemic drugs: Secondary | ICD-10-CM | POA: Diagnosis not present

## 2023-06-04 DIAGNOSIS — I10 Essential (primary) hypertension: Secondary | ICD-10-CM

## 2023-06-04 DIAGNOSIS — R079 Chest pain, unspecified: Secondary | ICD-10-CM | POA: Diagnosis not present

## 2023-06-04 DIAGNOSIS — Z79899 Other long term (current) drug therapy: Secondary | ICD-10-CM

## 2023-06-04 DIAGNOSIS — E1169 Type 2 diabetes mellitus with other specified complication: Secondary | ICD-10-CM

## 2023-06-04 DIAGNOSIS — E785 Hyperlipidemia, unspecified: Secondary | ICD-10-CM

## 2023-06-04 DIAGNOSIS — I209 Angina pectoris, unspecified: Secondary | ICD-10-CM

## 2023-06-04 MED ORDER — METOPROLOL TARTRATE 100 MG PO TABS: 100.0000 mg | ORAL_TABLET | Freq: Once | ORAL | 0 refills | Status: DC

## 2023-06-04 MED ORDER — ASPIRIN 81 MG PO TBEC: 81.0000 mg | DELAYED_RELEASE_TABLET | Freq: Every day | ORAL | 3 refills | Status: AC

## 2023-06-04 MED ORDER — EZETIMIBE 10 MG PO TABS: 10.0000 mg | ORAL_TABLET | Freq: Every day | ORAL | 3 refills | Status: DC

## 2023-06-04 NOTE — Patient Instructions (Addendum)
Medication Instructions:  On day of the CT scan for heart, hold the hydrochlorothiazide and the valsartan Take metoprolol tartrate 100 mg   Please start zetia 10 mg daily Aspirin 81 mg daily  If you need a refill on your cardiac medications before your next appointment, please call your pharmacy.   Lab work:  Your provider would like for you to return  to have the following labs drawn: BMP within 30 days of CT.   Please go to the [facility] entrance and check in at the front desk.  You do not need an appointment.  They are open from 7am-6 pm.   Testing/Procedures: Cardiac CTA for angina, CAD/angina    Your cardiac CT will be scheduled at one of the below locations:    [facility] [address] [place], [place] [address] [phone]  OR   [facility] [address] [place], [place] [address] [phone]   If scheduled at [facility] or [facility], please arrive 15 mins early for check-in and test prep.   Please follow these instructions carefully (unless otherwise directed):   On the Night Before the Test: Be sure to Drink plenty of water. Do not consume any caffeinated/decaffeinated beverages or chocolate 12 hours prior to your test. Do not take any antihistamines 12 hours prior to your test. On the Day of the Test: Drink plenty of water until 1 hour prior to the test. Do not eat any food 1 hour prior to test. You may take your regular medications prior to the test.  Take metoprolol (Lopressor) two hours prior to test. If you take Furosemide/Hydrochlorothiazide/Spironolactone, please HOLD on the morning of the test. FEMALES- please wear underwire-free bra if available, avoid dresses & tight clothing       After the Test: Drink plenty of water. After receiving IV contrast, you may experience a mild flushed feeling. This is  normal. On occasion, you may experience a mild rash up to 24 hours after the test. This is not dangerous. If this occurs, you can take Benadryl 25 mg and increase your fluid intake. If you experience trouble breathing, this can be serious. If it is severe call 911 IMMEDIATELY. If it is mild, please call our office. If you take any of these medications: Glipizide/Metformin, Avandament, Glucavance, please do not take 48 hours after completing test unless otherwise instructed.  We will call to schedule your test 2-4 weeks out understanding that some insurance companies will need an authorization prior to the service being performed.   For non-scheduling related questions, please contact the cardiac imaging nurse navigator should you have any questions/concerns: Rockwell Alexandria, Cardiac Imaging Nurse Navigator Larey Brick, Cardiac Imaging Nurse Navigator [facility] Direct Office Dial: [phone]   For scheduling needs, including cancellations and rescheduling, please call [place], [phone].   Follow-Up: At [facility], you and your health needs are our priority.  As part of our continuing mission to provide you with exceptional heart care, we have created designated Provider Care Teams.  These Care Teams include your primary Cardiologist (physician) and Advanced Practice Providers (APPs -  Physician Assistants and Nurse Practitioners) who all work together to provide you with the care you need, when you need it.  You will need a follow up appointment in 12 months  Providers on your designated Care Team:   Nicolasa Ducking, NP Eula Listen, PA-C Cadence Fransico Michael, PA-C  COVID-19  Vaccine Information can be found at: [email or web address] For questions related to vaccine distribution or appointments, please email [email or web address] or call [phone].

## 2023-06-04 NOTE — Telephone Encounter (Signed)
Too soon. Rx sent 03/18/23, #26/4 to [facility].  Request denied.

## 2023-06-13 ENCOUNTER — Other Ambulatory Visit
Admission: RE | Admit: 2023-06-13 | Discharge: 2023-06-13 | Disposition: A | Discharge status: Home / Self Care | Payer: PPO | Attending: Cardiovascular Disease | Admitting: Cardiovascular Disease

## 2023-06-13 DIAGNOSIS — Z79899 Other long term (current) drug therapy: Secondary | ICD-10-CM | POA: Diagnosis not present

## 2023-06-13 LAB — BASIC METABOLIC PANEL
Anion gap: 13 (ref 5–15)
BUN: 18 mg/dL (ref 8–23)
CO2: 25 mmol/L (ref 22–32)
Calcium: 9.4 mg/dL (ref 8.9–10.3)
Chloride: 99 mmol/L (ref 98–111)
Creatinine, Ser: 0.83 mg/dL (ref 0.44–1.00)
GFR, Estimated: >60 mL/min (ref >60)
Glucose, Bld: 140 mg/dL — ABNORMAL HIGH (ref 70–99)
Potassium: 3.5 mmol/L (ref 3.5–5.1)
Sodium: 137 mmol/L (ref 135–145)

## 2023-06-15 ENCOUNTER — Other Ambulatory Visit: Payer: Self-pay | Admitting: Family Medicine

## 2023-06-15 DIAGNOSIS — G2581 Restless legs syndrome: Secondary | ICD-10-CM

## 2023-06-17 NOTE — Telephone Encounter (Signed)
Tizanidine Last filled:  05/05/23, #180 Last OV:  03/18/23, CPE Next OV:  09/17/23, 6 mo f/u w/labs

## 2023-07-02 ENCOUNTER — Encounter (HOSPITAL_COMMUNITY): Payer: Self-pay

## 2023-07-04 ENCOUNTER — Ambulatory Visit
Admission: RE | Admit: 2023-07-04 | Discharge: 2023-07-04 | Disposition: A | Discharge status: Home / Self Care | Payer: PPO | Source: Ambulatory Visit | Attending: Cardiovascular Disease | Admitting: Cardiovascular Disease

## 2023-07-04 DIAGNOSIS — I2510 Atherosclerotic heart disease of native coronary artery without angina pectoris: Secondary | ICD-10-CM

## 2023-07-04 DIAGNOSIS — R079 Chest pain, unspecified: Secondary | ICD-10-CM

## 2023-07-04 MED ORDER — NITROGLYCERIN 0.4 MG SL SUBL
0.8000 mg | SUBLINGUAL_TABLET | Freq: Once | SUBLINGUAL | Status: AC
  Administered 2023-07-04: 0.8 mg via SUBLINGUAL

## 2023-07-04 MED ORDER — METOPROLOL TARTRATE 5 MG/5ML IV SOLN: 10.0000 mg | Freq: Once | INTRAVENOUS | Status: DC

## 2023-07-04 MED ORDER — SODIUM CHLORIDE 0.9 % IV SOLN: INTRAVENOUS | Status: DC

## 2023-07-04 MED ORDER — IOHEXOL 350 MG/ML SOLN
100.0000 mL | Freq: Once | INTRAVENOUS | Status: AC | PRN
  Administered 2023-07-04: 100 mL via INTRAVENOUS

## 2023-07-04 NOTE — Progress Notes (Signed)

## 2023-07-16 ENCOUNTER — Ambulatory Visit (INDEPENDENT_AMBULATORY_CARE_PROVIDER_SITE_OTHER): Payer: PPO

## 2023-07-16 DIAGNOSIS — Z23 Encounter for immunization: Secondary | ICD-10-CM

## 2023-07-16 NOTE — Progress Notes (Signed)
Sent to Dr Tobi Bastos in absence of Dr Servando Snare  Per orders of Dr. Servando Snare, injection of Hep A 3 of 3 given in left deltoid by Roena Malady.  Patient tolerated injection well.

## 2023-08-13 DIAGNOSIS — H35371 Puckering of macula, right eye: Secondary | ICD-10-CM | POA: Diagnosis not present

## 2023-08-13 DIAGNOSIS — H43812 Vitreous degeneration, left eye: Secondary | ICD-10-CM | POA: Diagnosis not present

## 2023-08-13 DIAGNOSIS — H43821 Vitreomacular adhesion, right eye: Secondary | ICD-10-CM | POA: Diagnosis not present

## 2023-08-13 DIAGNOSIS — E119 Type 2 diabetes mellitus without complications: Secondary | ICD-10-CM | POA: Diagnosis not present

## 2023-08-13 DIAGNOSIS — H2513 Age-related nuclear cataract, bilateral: Secondary | ICD-10-CM | POA: Diagnosis not present

## 2023-08-13 LAB — HM DIABETES EYE EXAM

## 2023-08-16 ENCOUNTER — Encounter: Payer: Self-pay | Admitting: Family Medicine

## 2023-09-17 ENCOUNTER — Encounter: Payer: Self-pay | Admitting: Family Medicine

## 2023-09-17 ENCOUNTER — Ambulatory Visit (INDEPENDENT_AMBULATORY_CARE_PROVIDER_SITE_OTHER): Payer: PPO | Admitting: Family Medicine

## 2023-09-17 VITALS — BP 132/74 | HR 78 | Temp 98.6°F | Ht <= 58 in | Wt 151.2 lb

## 2023-09-17 DIAGNOSIS — R0989 Other specified symptoms and signs involving the circulatory and respiratory systems: Secondary | ICD-10-CM

## 2023-09-17 DIAGNOSIS — M7989 Other specified soft tissue disorders: Secondary | ICD-10-CM

## 2023-09-17 DIAGNOSIS — E1169 Type 2 diabetes mellitus with other specified complication: Secondary | ICD-10-CM | POA: Diagnosis not present

## 2023-09-17 DIAGNOSIS — M79605 Pain in left leg: Secondary | ICD-10-CM | POA: Insufficient documentation

## 2023-09-17 DIAGNOSIS — Z23 Encounter for immunization: Secondary | ICD-10-CM

## 2023-09-17 DIAGNOSIS — Z7984 Long term (current) use of oral hypoglycemic drugs: Secondary | ICD-10-CM

## 2023-09-17 DIAGNOSIS — I25118 Atherosclerotic heart disease of native coronary artery with other forms of angina pectoris: Secondary | ICD-10-CM

## 2023-09-17 DIAGNOSIS — E785 Hyperlipidemia, unspecified: Secondary | ICD-10-CM | POA: Diagnosis not present

## 2023-09-17 LAB — POCT GLYCOSYLATED HEMOGLOBIN (HGB A1C): Hemoglobin A1C: 6.3 % — AB (ref 4.0–5.6)

## 2023-09-17 NOTE — Assessment & Plan Note (Addendum)
Mild, chronic swelling present since DVT age 67 yo after back surgery while on birth control.

## 2023-09-17 NOTE — Assessment & Plan Note (Signed)
Endorses possible claudication symptoms.  Update ABIs.  Also describes sciatica/radiculopathy symptoms in known lumbar scoliosis. Consider updated imaging.

## 2023-09-17 NOTE — Progress Notes (Signed)
Ph: [phone] Fax: [phone]   Patient ID: Olivia Benton, female    DOB: 08-25-1956, 67 y.o.   MRN: [id number]  This visit was conducted in person.  BP 132/74   Pulse 78   Temp 98.6 F (37 C) (Oral)   Ht 4\' 10"  (1.473 m)   Wt 151 lb 4 oz (68.6 kg)   SpO2 97%   BMI 31.61 kg/m    CC: 6 mo f/u visit  Subjective:   HPI: Olivia Benton is a 67 y.o. female presenting on 09/17/2023 for Medical Management of Chronic Issues (Here for 6 mo f/u and labs. Also, c/o L leg pain/swelling. )   Regularly seeing retinologist for h/o vitreous detachment and epiretinal membrane.  Saw cardiology Dr Mariah Milling 05/2023 with symptoms concerning for exertional angina or equivalent in h/o coronary calcification to LAD - proceeded with cardiac CTA which returned reassuring (CAC 212 (85% age) - mild non-obstructive CAD).   DM - does regularly check sugars fasting - 119 this morning. Peak 150, nadir 105. Compliant with antihyperglycemic regimen which includes: metformin 500mg  bid, actos 15mg  daily. Denies low sugars or hypoglycemic symptoms. Chronic left foot paresthesias, but denies blurry vision. Left leg radiculopathy at night in bed as well as neuropathy symptoms to foot. L leg can be swollen. Last diabetic eye exam 07/2023. Glucometer brand: one touch Ultra. Last foot exam: 08/2022. DSME: completed remotely 2004. Lab Results  Component Value Date   HGBA1C 6.3 (A) 09/17/2023   Diabetic Foot Exam - Simple   Simple Foot Form Diabetic Foot exam was performed with the following findings: Yes 09/17/2023  2:43 PM  Visual Inspection No deformities, no ulcerations, no other skin breakdown bilaterally: Yes Sensation Testing Intact to touch and monofilament testing bilaterally: Yes Pulse Check See comments: Yes Comments Diminished pulses on left + some claudication on left, no rest pain     Lab Results  Component Value Date   MICROALBUR 2.1 (H) 03/08/2023   HLD - known statin intolerance.  She continues lovastatin 20mg  twice weekly. Recently started on ezetimibe by cardiology  Lab Results  Component Value Date   CHOL 171 03/08/2023   HDL 31.30 (L) 03/08/2023   LDLCALC 83 04/10/2022   LDLDIRECT 103.0 03/08/2023   TRIG 211.0 (H) 03/08/2023   CHOLHDL 5 03/08/2023       Relevant past medical, surgical, family and social history reviewed and updated as indicated. Interim medical history since our last visit reviewed. Allergies and medications reviewed and updated. Outpatient Medications Prior to Visit  Medication Sig Dispense Refill   aspirin EC 81 MG tablet Take 1 tablet (81 mg total) by mouth daily. Swallow whole. 90 tablet 3   Blood Glucose Monitoring Suppl (ONE TOUCH ULTRA MINI) w/Device KIT Use as instructed to check blood sugar once daily. 1 kit 0   Cholecalciferol (VITAMIN D) 50 MCG (2000 UT) CAPS Take 1 capsule (2,000 Units total) by mouth daily. 30 capsule    ezetimibe (ZETIA) 10 MG tablet Take 1 tablet (10 mg total) by mouth daily. 90 tablet 3   FLUoxetine (PROZAC) 20 MG capsule Take 1 capsule (20 mg total) by mouth daily. 90 capsule 4   fluticasone (FLONASE) 50 MCG/ACT nasal spray USE 2 SPRAYS IN BOTH  NOSTRILS DAILY 48 g 2   gabapentin (NEURONTIN) 100 MG capsule Take 1-2 capsules (100-200 mg total) by mouth at bedtime. 60 capsule 6   glucose blood (ONETOUCH ULTRA) test strip Use as instructed to check blood sugar once  a day 100 strip 3   hydrochlorothiazide (MICROZIDE) 12.5 MG capsule Take 1 capsule (12.5 mg total) by mouth daily. 90 capsule 4   levocetirizine (XYZAL) 5 MG tablet Take 5 mg by mouth every evening.     lovastatin (MEVACOR) 20 MG tablet Take 1 tablet (20 mg total) by mouth 2 (two) times a week. 26 tablet 4   metFORMIN (GLUCOPHAGE) 500 MG tablet Take 1 tablet (500 mg total) by mouth 2 (two) times daily with a meal. 180 tablet 4   montelukast (SINGULAIR) 10 MG tablet TAKE 1 TABLET BY MOUTH EVERYDAY AT BEDTIME 90 tablet 4   Multiple Minerals-Vitamins  (CALCIUM & VIT D3 BONE HEALTH PO) Take by mouth daily.     OneTouch Delica Lancets 33G MISC Use to check sugar once daily. Dx: E11.9 100 each 3   pantoprazole (PROTONIX) 40 MG tablet TAKE 1 TABLET BY MOUTH EVERY DAY 90 tablet 1   pioglitazone (ACTOS) 15 MG tablet Take 1 tablet (15 mg total) by mouth daily. 90 tablet 4   tiZANidine (ZANAFLEX) 2 MG tablet TAKE 1 TABLET BY MOUTH TWICE A DAY AS NEEDED FOR MUSCLE SPASMS 180 tablet 1   valsartan (DIOVAN) 160 MG tablet Take 1 tablet (160 mg total) by mouth daily. 90 tablet 4   metoprolol tartrate (LOPRESSOR) 100 MG tablet Take 1 tablet (100 mg total) by mouth once for 1 dose. 1 tablet 0   No facility-administered medications prior to visit.     Per HPI unless specifically indicated in ROS section below Review of Systems  Objective:  BP 132/74   Pulse 78   Temp 98.6 F (37 C) (Oral)   Ht 4\' 10"  (1.473 m)   Wt 151 lb 4 oz (68.6 kg)   SpO2 97%   BMI 31.61 kg/m   Wt Readings from Last 3 Encounters:  09/17/23 151 lb 4 oz (68.6 kg)  06/04/23 155 lb 4 oz (70.4 kg)  04/02/23 155 lb (70.3 kg)      Physical Exam Vitals and nursing note reviewed.  Constitutional:      Appearance: Normal appearance. She is not ill-appearing.  HENT:     Head: Normocephalic and atraumatic.  Eyes:     Extraocular Movements: Extraocular movements intact.     Conjunctiva/sclera: Conjunctivae normal.     Pupils: Pupils are equal, round, and reactive to light.  Cardiovascular:     Rate and Rhythm: Normal rate and regular rhythm.     Pulses: Normal pulses.     Heart sounds: Normal heart sounds. No murmur heard. Pulmonary:     Effort: Pulmonary effort is normal. No respiratory distress.     Breath sounds: Normal breath sounds. No wheezing, rhonchi or rales.  Musculoskeletal:        General: Swelling present.     Right lower leg: No edema.     Left lower leg: Edema present.     Comments: Chronic nonpitting edema to LLE, since h/o remote DVT age 88yo after back  surgery  Skin:    General: Skin is warm and dry.     Findings: No rash.  Neurological:     Mental Status: She is alert.  Psychiatric:        Mood and Affect: Mood normal.        Behavior: Behavior normal.       Results for orders placed or performed in visit on 09/17/23  POCT glycosylated hemoglobin (Hb A1C)  Result Value Ref Range   Hemoglobin A1C  6.3 (A) 4.0 - 5.6 %   HbA1c POC (<> result, manual entry)     HbA1c, POC (prediabetic range)     HbA1c, POC (controlled diabetic range)      Assessment & Plan:   Problem List Items Addressed This Visit     Type 2 diabetes mellitus with other specified complication (HCC) - Primary    Chronic, improved control on current regimen of metformin and low dose actos - continue.  Could consider GLP1RA for weight loss/cardiovascular benefit.       Relevant Orders   POCT glycosylated hemoglobin (Hb A1C) (Completed)   Dyslipidemia associated with type 2 diabetes mellitus (HCC)    Continues twice weekly lovastatin, recently ezetimibe was added by cardiology.  Update FLP when she returns fasting.  The 10-year ASCVD risk score (Arnett DK, et al., 2019) is: 21.8%   Values used to calculate the score:     Age: 37 years     Sex: Female     Is Non-Hispanic African American: No     Diabetic: Yes     Tobacco smoker: No     Systolic Blood Pressure: 132 mmHg     Is BP treated: Yes     HDL Cholesterol: 31.3 mg/dL     Total Cholesterol: 171 mg/dL       Relevant Orders   Lipid panel   Comprehensive metabolic panel   TSH   CK   Left leg swelling    Mild, chronic swelling present since DVT age 22 yo after back surgery while on birth control.       CAD (coronary artery disease)    Appreciate cardiology care.       Left leg pain    Endorses possible claudication symptoms.  Update ABIs.  Also describes sciatica/radiculopathy symptoms in known lumbar scoliosis. Consider updated imaging.       Relevant Orders   VAS [place] ABI WITH/WO TBI    CK   Other Visit Diagnoses     Encounter for immunization       Relevant Orders   Flu Vaccine Trivalent High Dose (Fluad) (Completed)   Diminished pulses in lower extremity       Relevant Orders   VAS [place] ABI WITH/WO TBI        No orders of the defined types were placed in this encounter.   Orders Placed This Encounter  Procedures   Flu Vaccine Trivalent High Dose (Fluad)   Lipid panel    Standing Status:   Future    Standing Expiration Date:   09/16/2024   Comprehensive metabolic panel    Standing Status:   Future    Standing Expiration Date:   09/16/2024   TSH    Standing Status:   Future    Standing Expiration Date:   09/16/2024   CK    Standing Status:   Future    Standing Expiration Date:   09/16/2024   POCT glycosylated hemoglobin (Hb A1C)    Patient Instructions  Flu shot today  A1c today. Schedule fasting lab visit at your convenience (cholesterol levels).  We will refer you for artery circulation evaluation of left lower leg.  Return in 6 months for physical/wellness visit   Follow up plan: Return in about 6 months (around 03/17/2024) for annual exam, prior fasting for blood work, medicare wellness visit.  Eustaquio Boyden, MD

## 2023-09-17 NOTE — Assessment & Plan Note (Addendum)
Chronic, improved control on current regimen of metformin and low dose actos - continue.  Could consider GLP1RA for weight loss/cardiovascular benefit.

## 2023-09-17 NOTE — Assessment & Plan Note (Addendum)
Appreciate cardiology care.  °

## 2023-09-17 NOTE — Patient Instructions (Addendum)
Flu shot today  A1c today. Schedule fasting lab visit at your convenience (cholesterol levels).  We will refer you for artery circulation evaluation of left lower leg.  Return in 6 months for physical/wellness visit

## 2023-09-17 NOTE — Assessment & Plan Note (Addendum)
Continues twice weekly lovastatin, recently ezetimibe was added by cardiology.  Update FLP when she returns fasting.  The 10-year ASCVD risk score (Arnett DK, et al., 2019) is: 21.8%   Values used to calculate the score:     Age: 67 years     Sex: Female     Is Non-Hispanic African American: No     Diabetic: Yes     Tobacco smoker: No     Systolic Blood Pressure: 132 mmHg     Is BP treated: Yes     HDL Cholesterol: 31.3 mg/dL     Total Cholesterol: 171 mg/dL

## 2023-09-18 ENCOUNTER — Other Ambulatory Visit (INDEPENDENT_AMBULATORY_CARE_PROVIDER_SITE_OTHER): Payer: PPO

## 2023-09-18 DIAGNOSIS — E785 Hyperlipidemia, unspecified: Secondary | ICD-10-CM

## 2023-09-18 DIAGNOSIS — E1169 Type 2 diabetes mellitus with other specified complication: Secondary | ICD-10-CM

## 2023-09-18 DIAGNOSIS — M79605 Pain in left leg: Secondary | ICD-10-CM

## 2023-09-18 LAB — LIPID PANEL
Cholesterol: 127 mg/dL (ref 0–200)
HDL: 32 mg/dL — ABNORMAL LOW (ref >39.00)
LDL Cholesterol: 68 mg/dL (ref 0–99)
NonHDL: 95.25
Total CHOL/HDL Ratio: 4
Triglycerides: 138 mg/dL (ref 0.0–149.0)
VLDL: 27.6 mg/dL (ref 0.0–40.0)

## 2023-09-18 LAB — COMPREHENSIVE METABOLIC PANEL
ALT: 27 U/L (ref 0–35)
AST: 21 U/L (ref 0–37)
Albumin: 4.4 g/dL (ref 3.5–5.2)
Alkaline Phosphatase: 53 U/L (ref 39–117)
BUN: 18 mg/dL (ref 6–23)
CO2: 30 meq/L (ref 19–32)
Calcium: 9.6 mg/dL (ref 8.4–10.5)
Chloride: 102 meq/L (ref 96–112)
Creatinine, Ser: 0.79 mg/dL (ref 0.40–1.20)
GFR: 76.84 mL/min (ref >60.00)
Glucose, Bld: 93 mg/dL (ref 70–99)
Potassium: 3.7 meq/L (ref 3.5–5.1)
Sodium: 142 meq/L (ref 135–145)
Total Bilirubin: 0.4 mg/dL (ref 0.2–1.2)
Total Protein: 7 g/dL (ref 6.0–8.3)

## 2023-09-18 LAB — CK: Total CK: 66 U/L (ref 7–177)

## 2023-09-18 LAB — TSH: TSH: 1.8 u[IU]/mL (ref 0.35–5.50)

## 2023-10-23 ENCOUNTER — Other Ambulatory Visit: Payer: Self-pay | Admitting: Family Medicine

## 2023-10-23 DIAGNOSIS — E1169 Type 2 diabetes mellitus with other specified complication: Secondary | ICD-10-CM

## 2023-10-23 DIAGNOSIS — E119 Type 2 diabetes mellitus without complications: Secondary | ICD-10-CM | POA: Diagnosis not present

## 2023-10-23 DIAGNOSIS — H2513 Age-related nuclear cataract, bilateral: Secondary | ICD-10-CM | POA: Diagnosis not present

## 2023-10-23 DIAGNOSIS — H43821 Vitreomacular adhesion, right eye: Secondary | ICD-10-CM | POA: Diagnosis not present

## 2023-10-23 DIAGNOSIS — H35371 Puckering of macula, right eye: Secondary | ICD-10-CM | POA: Diagnosis not present

## 2023-10-23 DIAGNOSIS — H43812 Vitreous degeneration, left eye: Secondary | ICD-10-CM | POA: Diagnosis not present

## 2023-11-01 ENCOUNTER — Ambulatory Visit: Payer: PPO | Attending: Family Medicine

## 2023-11-01 DIAGNOSIS — M79605 Pain in left leg: Secondary | ICD-10-CM | POA: Diagnosis not present

## 2023-11-01 DIAGNOSIS — R0989 Other specified symptoms and signs involving the circulatory and respiratory systems: Secondary | ICD-10-CM | POA: Diagnosis not present

## 2023-11-01 LAB — VAS US ABI WITH/WO TBI
Left ABI: 1.15
Right ABI: 1.19

## 2023-12-15 ENCOUNTER — Other Ambulatory Visit: Payer: Self-pay | Admitting: Gastroenterology

## 2023-12-15 ENCOUNTER — Other Ambulatory Visit: Payer: Self-pay | Admitting: Family Medicine

## 2023-12-15 DIAGNOSIS — G2581 Restless legs syndrome: Secondary | ICD-10-CM

## 2023-12-16 NOTE — Telephone Encounter (Signed)
Tizanidine Last filled:  09/17/23, #180 Last OV:  09/17/23, 6 mo f/u Next OV:  03/18/24, CPE

## 2024-01-16 ENCOUNTER — Ambulatory Visit: Payer: PPO

## 2024-01-16 VITALS — Ht <= 58 in | Wt 151.0 lb

## 2024-01-16 DIAGNOSIS — Z0000 Encounter for general adult medical examination without abnormal findings: Secondary | ICD-10-CM | POA: Diagnosis not present

## 2024-01-16 NOTE — Progress Notes (Signed)
Subjective:   Olivia Benton is a 68 y.o. female who presents for Medicare Annual (Subsequent) preventive examination.  Visit Complete: Virtual I connected with  [name] on 01/16/24 by a audio enabled telemedicine application and verified that I am speaking with the correct person using two identifiers.  Patient Location: Home  Provider Location: Office/Clinic  I discussed the limitations of evaluation and management by telemedicine. The patient expressed understanding and agreed to proceed.  Vital Signs: Because this visit was a virtual/telehealth visit, some criteria may be missing or patient reported. Any vitals not documented were not able to be obtained and vitals that have been documented are patient reported.  Patient Medicare AWV questionnaire was completed by the patient on (not done); I have confirmed that all information answered by patient is correct and no changes since this date.  Cardiac Risk Factors include: advanced age (>79men, >60 women);diabetes mellitus;dyslipidemia;hypertension;obesity (BMI >30kg/m2);sedentary lifestyle    Objective:    Today's Vitals   01/16/24 1504 01/16/24 1505  Weight: 151 lb (68.5 kg)   Height: 4\' 10"  (1.473 m)   PainSc:  4    Body mass index is 31.56 kg/m.     01/16/2024    3:18 PM 01/15/2023    9:53 AM 01/10/2023    2:31 PM  Advanced Directives  Does Patient Have a Medical Advance Directive? No No No  Would patient like information on creating a medical advance directive?   No - Patient declined    Current Medications (verified) Outpatient Encounter Medications as of 01/16/2024  Medication Sig   aspirin EC 81 MG tablet Take 1 tablet (81 mg total) by mouth daily. Swallow whole.   Blood Glucose Monitoring Suppl (ONE TOUCH ULTRA MINI) w/Device KIT Use as instructed to check blood sugar once daily.   Cholecalciferol (VITAMIN D) 50 MCG (2000 UT) CAPS Take 1 capsule (2,000 Units total) by mouth daily.   FLUoxetine  (PROZAC) 20 MG capsule Take 1 capsule (20 mg total) by mouth daily.   fluticasone (FLONASE) 50 MCG/ACT nasal spray USE 2 SPRAYS IN BOTH  NOSTRILS DAILY   gabapentin (NEURONTIN) 100 MG capsule Take 1-2 capsules (100-200 mg total) by mouth at bedtime.   glucose blood (ONETOUCH ULTRA) test strip Use as instructed to check blood sugar once a day   hydrochlorothiazide (MICROZIDE) 12.5 MG capsule Take 1 capsule (12.5 mg total) by mouth daily.   levocetirizine (XYZAL) 5 MG tablet Take 5 mg by mouth every evening.   lovastatin (MEVACOR) 20 MG tablet Take 1 tablet (20 mg total) by mouth 2 (two) times a week.   metFORMIN (GLUCOPHAGE) 500 MG tablet Take 1 tablet (500 mg total) by mouth 2 (two) times daily with a meal.   montelukast (SINGULAIR) 10 MG tablet TAKE 1 TABLET BY MOUTH EVERYDAY AT BEDTIME   Multiple Minerals-Vitamins (CALCIUM & VIT D3 BONE HEALTH PO) Take by mouth daily.   OneTouch Delica Lancets 33G MISC Use to check sugar once daily. Dx: E11.9   pantoprazole (PROTONIX) 40 MG tablet TAKE 1 TABLET BY MOUTH EVERY DAY   pioglitazone (ACTOS) 15 MG tablet TAKE 1 TABLET (15 MG TOTAL) BY MOUTH DAILY.   tiZANidine (ZANAFLEX) 2 MG tablet TAKE 1 TABLET BY MOUTH TWICE A DAY AS NEEDED FOR MUSCLE SPASM   valsartan (DIOVAN) 160 MG tablet Take 1 tablet (160 mg total) by mouth daily.   ezetimibe (ZETIA) 10 MG tablet Take 1 tablet (10 mg total) by mouth daily.   metoprolol tartrate (LOPRESSOR)  100 MG tablet Take 1 tablet (100 mg total) by mouth once for 1 dose.   No facility-administered encounter medications on file as of 01/16/2024.    Allergies (verified) Amoxicillin, Biaxin [clarithromycin], Gold-containing drug products, Latex, Statins, and Other   History: Past Medical History:  Diagnosis Date   Asthma    extrinsic   Bilateral carpal tunnel syndrome 11/06/2016   Community acquired pneumonia 12/31/2016   Diabetes mellitus    non-insulin dependent   Foot fracture    History of deep vein thrombosis  (DVT) of lower extremity 1974   after back surgery while on OCP   Hyperlipidemia    Hypertension    Rhinitis    Past Surgical History:  Procedure Laterality Date   BACK SURGERY  2010   hardware - Dr. Lysbeth Galas SURGERY  1974   scoliosis   BREAST BIOPSY Left 09/2011   Dr. Katrinka Blazing- neg- FIBROCYSTIC CHANGE    COLONOSCOPY WITH PROPOFOL N/A 01/15/2023   TA x2, diverticulosis, rpt 7 yrs Servando Snare, Darren, MD)   ESOPHAGOGASTRODUODENOSCOPY N/A 01/15/2023   small HH otherwise WNL Servando Snare, Darren, MD)   TOTAL ABDOMINAL HYSTERECTOMY  1991   endometriosis   Family History  Problem Relation Age of Onset   Hypertension Mother    Alzheimer's disease Father    Heart murmur Father    Cancer Brother    Heart disease Maternal Grandmother        pacemaker   Cancer Maternal Grandfather        liver   Leukemia Paternal Grandmother    Breast cancer Neg Hx    Social History   Socioeconomic History   Marital status: Divorced    Spouse name: Not on file   Number of children: Not on file   Years of education: Not on file   Highest education level: Associate degree: occupational, Scientist, product/process development, or vocational program  Occupational History   Not on file  Tobacco Use   Smoking status: Former    Current packs/day: 0.00    Average packs/day: 1 pack/day for 44.0 years (44.0 ttl pk-yrs)    Types: Cigarettes    Start date: 10/17/1965    Quit date: 10/17/2009    Years since quitting: 14.2   Smokeless tobacco: Never  Vaping Use   Vaping status: Never Used  Substance and Sexual Activity   Alcohol use: No   Drug use: No   Sexual activity: Not on file  Other Topics Concern   Not on file  Social History Narrative   Lives alone, no pets   Occ: [employer]   Activity: no regular exercise   Diet: good water, fruits/vegetables daily   Social Drivers of Health   Financial Resource Strain: Low Risk  (01/16/2024)   Overall Financial Resource Strain (CARDIA)    Difficulty of Paying Living Expenses: Not hard at all   Food Insecurity: No Food Insecurity (01/16/2024)   Hunger Vital Sign    Worried About Running Out of Food in the Last Year: Never true    Ran Out of Food in the Last Year: Never true  Transportation Needs: No Transportation Needs (01/16/2024)   PRAPARE - Administrator, Civil Service (Medical): No    Lack of Transportation (Non-Medical): No  Physical Activity: Inactive (01/16/2024)   Exercise Vital Sign    Days of Exercise per Week: 0 days    Minutes of Exercise per Session: 0 min  Stress: No Stress Concern Present (01/16/2024)   [employer] of  Occupational Health - Occupational Stress Questionnaire    Feeling of Stress : Not at all  Social Connections: Socially Isolated (01/16/2024)   Social Connection and Isolation Panel [NHANES]    Frequency of Communication with Friends and Family: Once a week    Frequency of Social Gatherings with Friends and Family: Never    Attends Religious Services: Never    Diplomatic Services operational officer: No    Attends Engineer, structural: Never    Marital Status: Divorced    Tobacco Counseling Counseling given: Not Answered  Clinical Intake:  Pre-visit preparation completed: No  Pain : 0-10 Pain Score: 4  Pain Type: Chronic pain Pain Location: Back (left leg) Pain Orientation: Lower Pain Descriptors / Indicators: Aching Pain Onset: More than a month ago Pain Frequency: Constant Pain Relieving Factors: Tylenol, gabapentin, muscle relaxants,heating pad  Pain Relieving Factors: Tylenol, gabapentin, muscle relaxants,heating pad  BMI - recorded: 31.56 Nutritional Status: BMI > 30  Obese Nutritional Risks: None Diabetes: Yes CBG done?: No Did pt. bring in CBG monitor from home?: No  How often do you need to have someone help you when you read instructions, pamphlets, or other written materials from your doctor or pharmacy?: 1 - Never  Interpreter Needed?: No  Comments: lives with mother Information entered  by :: B.Leidi Astle,LPN  Activities of Daily Living    01/16/2024    3:18 PM 03/18/2023   12:17 PM  In your present state of health, do you have any difficulty performing the following activities:  Hearing? 0 1  Vision? 1 0  Difficulty concentrating or making decisions? 0 0  Walking or climbing stairs? 1 0  Dressing or bathing? 0 0  Doing errands, shopping? 0 0  Preparing Food and eating ? N   Using the Toilet? N   In the past six months, have you accidently leaked urine? N   Do you have problems with loss of bowel control? N   Managing your Medications? N   Managing your Finances? N   Housekeeping or managing your Housekeeping? N     Patient Care Team: Eustaquio Boyden, MD as PCP - General (Family Medicine) [facility]  Indicate any recent Medical Services you may have received from other than [facility] providers in the past year (date may be approximate).     Assessment:   This is a routine wellness examination for Olivia Benton.  Hearing/Vision screen Hearing Screening - Comments:: Pt says she has trouble hearing when wax builds up and ringing in ears  Vision Screening - Comments:: Pt says her vision is good sometimes Macular pusker Dr Stephannie Li Dr Alvester Morin   Goals Addressed             This Visit's Progress    DIET - EAT MORE FRUITS AND VEGETABLES   Not on track      Depression Screen    01/16/2024    3:13 PM 09/17/2023    2:23 PM 03/18/2023   12:17 PM 01/10/2023    2:29 PM 01/09/2022    8:39 AM 12/12/2020    2:13 PM 03/01/2020    2:05 PM  PHQ 2/9 Scores  PHQ - 2 Score 0 0 0 0 2 1 0  PHQ- 9 Score  1  0 5 3 3     Fall Risk    01/16/2024    3:10 PM 09/17/2023    2:23 PM 03/18/2023   12:17 PM 01/10/2023    2:32 PM 01/09/2022  8:20 AM  Fall Risk   Falls in the past year? 0 0 0 0 0  Number falls in past yr: 0  0 0   Injury with Fall? 0  0 0   Risk for fall due to : No Fall Risks  No Fall Risks No Fall Risks   Follow up Education provided;Falls  prevention discussed  Falls evaluation completed Falls prevention discussed;Falls evaluation completed     MEDICARE RISK AT HOME: Medicare Risk at Home Any stairs in or around the home?: No If so, are there any without handrails?: No Home free of loose throw rugs in walkways, pet beds, electrical cords, etc?: Yes Adequate lighting in your home to reduce risk of falls?: Yes Life alert?: No Use of a cane, walker or w/c?: No Grab bars in the bathroom?: Yes Shower chair or bench in shower?: No Elevated toilet seat or a handicapped toilet?: Yes  TIMED UP AND GO:  Was the test performed?  No    Cognitive Function:        01/16/2024    3:21 PM 01/10/2023    2:40 PM  6CIT Screen  What Year? 0 points 0 points  What month? 0 points 0 points  What time? 0 points 0 points  Count back from 20 0 points 0 points  Months in reverse 0 points 0 points  Repeat phrase 2 points 2 points  Total Score 2 points 2 points    Immunizations Immunization History  Administered Date(s) Administered   Fluad Quad(high Dose 65+) 09/10/2022   Fluad Trivalent(High Dose 65+) 09/17/2023   Hepatitis A, Adult 01/08/2023, 02/12/2023, 07/16/2023   Influenza Split 09/20/2011, 09/29/2012, 09/23/2014   Influenza, High Dose Seasonal PF 09/27/2020, 09/20/2021   Influenza,inj,Quad PF,6+ Mos 08/20/2016, 09/26/2018, 09/19/2019   Influenza-Unspecified 10/18/2013, 08/16/2015, 09/16/2017   Moderna Sars-Covid-2 Vaccination 03/03/2020, 03/31/2020   PNEUMOCOCCAL CONJUGATE-20 01/09/2022   Pneumococcal Polysaccharide-23 09/02/2017   Zoster Recombinant(Shingrix) 03/19/2023, 05/24/2023    TDAP status: Up to date  Flu Vaccine status: Up to date  Pneumococcal vaccine status: Up to date  Covid-19 vaccine status: Completed vaccines  Qualifies for Shingles Vaccine? Yes   Zostavax completed Yes   Shingrix Completed?: Yes  Screening Tests Health Maintenance  Topic Date Due   DTaP/Tdap/Td (1 - Tdap) Never done    COVID-19 Vaccine (3 - Moderna risk series) 04/28/2020   Diabetic kidney evaluation - Urine ACR  03/07/2024   HEMOGLOBIN A1C  03/17/2024   MAMMOGRAM  05/27/2024   Lung Cancer Screening  07/03/2024   OPHTHALMOLOGY EXAM  08/12/2024   FOOT EXAM  09/16/2024   Diabetic kidney evaluation - eGFR measurement  09/17/2024   Medicare Annual Wellness (AWV)  01/15/2025   Colonoscopy  01/15/2030   Pneumonia Vaccine 14+ Years old  Completed   INFLUENZA VACCINE  Completed   DEXA SCAN  Completed   Hepatitis C Screening  Completed   Zoster Vaccines- Shingrix  Completed   HPV VACCINES  Aged Out    Health Maintenance  Health Maintenance Due  Topic Date Due   DTaP/Tdap/Td (1 - Tdap) Never done   COVID-19 Vaccine (3 - Moderna risk series) 04/28/2020    Colorectal cancer screening: Type of screening: Colonoscopy. Completed 01/15/2023. Repeat every 7 years  Mammogram status: Completed 05/28/23. Repeat every year  Bone Density status: Completed 05/28/2023. Results reflect: Bone density results: OSTEOPENIA. Repeat every 5 years.  Lung Cancer Screening: (Low Dose CT Chest recommended if Age 60-80 years, 20 pack-year currently smoking OR have  quit w/in 15years.) does not qualify.   Lung Cancer Screening Referral: already in place:due in April 2025  Additional Screening:  Hepatitis C Screening: does not qualify; Completed 12/20/2022  Vision Screening: Recommended annual ophthalmology exams for early detection of glaucoma and other disorders of the eye. Is the patient up to date with their annual eye exam?  Yes  Who is the provider or what is the name of the office in which the patient attends annual eye exams? Dr Alvester Morin If pt is not established with a provider, would they like to be referred to a provider to establish care? No .   Dental Screening: Recommended annual dental exams for proper oral hygiene  Diabetic Foot Exam: Diabetic Foot Exam: Completed 10/07/2023  Community Resource Referral / Chronic  Care Management: CRR required this visit?  No   CCM required this visit?  No    Plan:     I have personally reviewed and noted the following in the patient's chart:   Medical and social history Use of alcohol, tobacco or illicit drugs  Current medications and supplements including opioid prescriptions. Patient is not currently taking opioid prescriptions. Functional ability and status Nutritional status Physical activity Advanced directives List of other physicians Hospitalizations, surgeries, and ER visits in previous 12 months Vitals Screenings to include cognitive, depression, and falls Referrals and appointments  In addition, I have reviewed and discussed with patient certain preventive protocols, quality metrics, and best practice recommendations. A written personalized care plan for preventive services as well as general preventive health recommendations were provided to patient.   Sue Lush, LPN   03/25/8118   After Visit Summary: (MyChart) Due to this being a telephonic visit, the after visit summary with patients personalized plan was offered to patient via MyChart   Nurse Notes: The patient states she is doing well and has no concerns or questions at this time.

## 2024-01-16 NOTE — Patient Instructions (Signed)
Olivia Benton , Thank you for taking time to come for your Medicare Wellness Visit. I appreciate your ongoing commitment to your health goals. Please review the following plan we discussed and let me know if I can assist you in the future.   Referrals/Orders/Follow-Ups/Clinician Recommendations: none  This is a list of the screening recommended for you and due dates:  Health Maintenance  Topic Date Due   DTaP/Tdap/Td vaccine (1 - Tdap) Never done   COVID-19 Vaccine (3 - Moderna risk series) 04/28/2020   Yearly kidney health urinalysis for diabetes  03/07/2024   Hemoglobin A1C  03/17/2024   Mammogram  05/27/2024   Screening for Lung Cancer  07/03/2024   Eye exam for diabetics  08/12/2024   Complete foot exam   09/16/2024   Yearly kidney function blood test for diabetes  09/17/2024   Medicare Annual Wellness Visit  01/15/2025   Colon Cancer Screening  01/15/2030   Pneumonia Vaccine  Completed   Flu Shot  Completed   DEXA scan (bone density measurement)  Completed   Hepatitis C Screening  Completed   Zoster (Shingles) Vaccine  Completed   HPV Vaccine  Aged Out    Advanced directives: (Declined) Advance directive discussed with you today. Even though you declined this today, please call our office should you change your mind, and we can give you the proper paperwork for you to fill out.  Next Medicare Annual Wellness Visit scheduled for next year: Yes 01/18/2025 @2 :20pm televisit

## 2024-01-30 ENCOUNTER — Other Ambulatory Visit: Payer: Self-pay | Admitting: Family Medicine

## 2024-01-30 DIAGNOSIS — G6289 Other specified polyneuropathies: Secondary | ICD-10-CM

## 2024-01-31 NOTE — Telephone Encounter (Signed)
Gabapentin Last filled:  12/24/23, #60 Last OV:  09/17/23, 6 mo f/u  Next OV:  03/18/24, CPE

## 2024-03-07 ENCOUNTER — Other Ambulatory Visit: Payer: Self-pay | Admitting: Family Medicine

## 2024-03-07 DIAGNOSIS — E1169 Type 2 diabetes mellitus with other specified complication: Secondary | ICD-10-CM

## 2024-03-07 DIAGNOSIS — G720 Drug-induced myopathy: Secondary | ICD-10-CM

## 2024-03-07 DIAGNOSIS — E559 Vitamin D deficiency, unspecified: Secondary | ICD-10-CM

## 2024-03-07 DIAGNOSIS — G2581 Restless legs syndrome: Secondary | ICD-10-CM

## 2024-03-11 ENCOUNTER — Other Ambulatory Visit: Payer: PPO

## 2024-03-14 ENCOUNTER — Other Ambulatory Visit: Payer: Self-pay | Admitting: Gastroenterology

## 2024-03-16 ENCOUNTER — Other Ambulatory Visit: Payer: Self-pay | Admitting: Cardiovascular Disease

## 2024-03-17 ENCOUNTER — Other Ambulatory Visit: Payer: Self-pay | Admitting: Family Medicine

## 2024-03-17 DIAGNOSIS — J439 Emphysema, unspecified: Secondary | ICD-10-CM

## 2024-03-17 DIAGNOSIS — E1169 Type 2 diabetes mellitus with other specified complication: Secondary | ICD-10-CM

## 2024-03-18 ENCOUNTER — Encounter: Payer: PPO | Admitting: Family Medicine

## 2024-03-24 DIAGNOSIS — H35371 Puckering of macula, right eye: Secondary | ICD-10-CM | POA: Diagnosis not present

## 2024-03-24 DIAGNOSIS — H43812 Vitreous degeneration, left eye: Secondary | ICD-10-CM | POA: Diagnosis not present

## 2024-03-24 DIAGNOSIS — E119 Type 2 diabetes mellitus without complications: Secondary | ICD-10-CM | POA: Diagnosis not present

## 2024-03-24 DIAGNOSIS — H43821 Vitreomacular adhesion, right eye: Secondary | ICD-10-CM | POA: Diagnosis not present

## 2024-03-24 DIAGNOSIS — H2513 Age-related nuclear cataract, bilateral: Secondary | ICD-10-CM | POA: Diagnosis not present

## 2024-03-24 DIAGNOSIS — D3131 Benign neoplasm of right choroid: Secondary | ICD-10-CM | POA: Diagnosis not present

## 2024-03-30 ENCOUNTER — Other Ambulatory Visit: Payer: Self-pay | Admitting: Acute Care

## 2024-03-30 DIAGNOSIS — Z87891 Personal history of nicotine dependence: Secondary | ICD-10-CM

## 2024-04-13 ENCOUNTER — Other Ambulatory Visit: Payer: Self-pay | Admitting: Family Medicine

## 2024-04-13 DIAGNOSIS — Z636 Dependent relative needing care at home: Secondary | ICD-10-CM

## 2024-04-13 DIAGNOSIS — I10 Essential (primary) hypertension: Secondary | ICD-10-CM

## 2024-04-22 ENCOUNTER — Other Ambulatory Visit: Payer: Self-pay | Admitting: Family Medicine

## 2024-04-22 DIAGNOSIS — I10 Essential (primary) hypertension: Secondary | ICD-10-CM

## 2024-04-23 ENCOUNTER — Ambulatory Visit
Admission: RE | Admit: 2024-04-23 | Discharge: 2024-04-23 | Disposition: A | Discharge status: Home / Self Care | Source: Ambulatory Visit | Attending: Acute Care | Admitting: Acute Care

## 2024-04-23 DIAGNOSIS — R911 Solitary pulmonary nodule: Secondary | ICD-10-CM | POA: Diagnosis not present

## 2024-04-23 DIAGNOSIS — Z87891 Personal history of nicotine dependence: Secondary | ICD-10-CM | POA: Diagnosis not present

## 2024-04-23 DIAGNOSIS — Z122 Encounter for screening for malignant neoplasm of respiratory organs: Secondary | ICD-10-CM | POA: Diagnosis not present

## 2024-05-18 ENCOUNTER — Telehealth: Payer: Self-pay | Admitting: *Deleted

## 2024-05-18 DIAGNOSIS — R911 Solitary pulmonary nodule: Secondary | ICD-10-CM

## 2024-05-18 DIAGNOSIS — Z87891 Personal history of nicotine dependence: Secondary | ICD-10-CM

## 2024-05-18 NOTE — Telephone Encounter (Signed)
 Call report from [facility]:   IMPRESSION: 1. Lung-RADS 4A, suspicious. Follow up low-dose chest CT without contrast in 3 months (please use the following order, "CT CHEST LCS NODULE FOLLOW-UP W/O CM") is recommended. Alternatively, PET may be considered when there is a solid component 8mm or larger. New 6.8 mm subpleural posterior left lower lobe nodule, likely atelectatic but warranting close follow-up. 2. Aortic Atherosclerosis (ICD10-I70.0) and Emphysema (ICD10-J43.9).

## 2024-05-20 NOTE — Telephone Encounter (Signed)
 Dara Ear NP has reviewed the scan and due to the new 6.40mm nodule seen since 03/2023 LDCT it has been advised the pt has a 3 month follow up scan. Will need to call patient with results and send PCP results and plan.

## 2024-05-21 NOTE — Telephone Encounter (Signed)
 Spoke with pt and reviewed lung screening CT results per Dara Ear, NP. PT verbalized understanding and is in agreement with a repeat CT in 3 months. CT scheduled 07/27/24 2:00. Results / plans faxed to PCP.

## 2024-05-21 NOTE — Telephone Encounter (Signed)
 LVM to call office and review CT results.

## 2024-05-25 ENCOUNTER — Other Ambulatory Visit (INDEPENDENT_AMBULATORY_CARE_PROVIDER_SITE_OTHER)

## 2024-05-25 ENCOUNTER — Other Ambulatory Visit

## 2024-05-25 DIAGNOSIS — G2581 Restless legs syndrome: Secondary | ICD-10-CM

## 2024-05-25 DIAGNOSIS — E785 Hyperlipidemia, unspecified: Secondary | ICD-10-CM | POA: Diagnosis not present

## 2024-05-25 DIAGNOSIS — G720 Drug-induced myopathy: Secondary | ICD-10-CM | POA: Diagnosis not present

## 2024-05-25 DIAGNOSIS — T466X5A Adverse effect of antihyperlipidemic and antiarteriosclerotic drugs, initial encounter: Secondary | ICD-10-CM

## 2024-05-25 DIAGNOSIS — E1169 Type 2 diabetes mellitus with other specified complication: Secondary | ICD-10-CM | POA: Diagnosis not present

## 2024-05-25 DIAGNOSIS — E559 Vitamin D deficiency, unspecified: Secondary | ICD-10-CM | POA: Diagnosis not present

## 2024-06-01 ENCOUNTER — Ambulatory Visit: Payer: Self-pay | Admitting: Family Medicine

## 2024-06-01 ENCOUNTER — Encounter: Payer: Self-pay | Admitting: Family Medicine

## 2024-06-01 ENCOUNTER — Ambulatory Visit: Admitting: Family Medicine

## 2024-06-01 VITALS — BP 138/78 | HR 66 | Temp 98.4°F | Ht <= 58 in | Wt 157.4 lb

## 2024-06-01 DIAGNOSIS — G2581 Restless legs syndrome: Secondary | ICD-10-CM

## 2024-06-01 DIAGNOSIS — Z87891 Personal history of nicotine dependence: Secondary | ICD-10-CM

## 2024-06-01 DIAGNOSIS — E66811 Obesity, class 1: Secondary | ICD-10-CM

## 2024-06-01 DIAGNOSIS — G720 Drug-induced myopathy: Secondary | ICD-10-CM

## 2024-06-01 DIAGNOSIS — E538 Deficiency of other specified B group vitamins: Secondary | ICD-10-CM | POA: Diagnosis not present

## 2024-06-01 DIAGNOSIS — Z0001 Encounter for general adult medical examination with abnormal findings: Secondary | ICD-10-CM

## 2024-06-01 DIAGNOSIS — G6289 Other specified polyneuropathies: Secondary | ICD-10-CM

## 2024-06-01 DIAGNOSIS — I25118 Atherosclerotic heart disease of native coronary artery with other forms of angina pectoris: Secondary | ICD-10-CM | POA: Diagnosis not present

## 2024-06-01 DIAGNOSIS — Z636 Dependent relative needing care at home: Secondary | ICD-10-CM

## 2024-06-01 DIAGNOSIS — M545 Low back pain, unspecified: Secondary | ICD-10-CM

## 2024-06-01 DIAGNOSIS — E611 Iron deficiency: Secondary | ICD-10-CM

## 2024-06-01 DIAGNOSIS — Z7189 Other specified counseling: Secondary | ICD-10-CM

## 2024-06-01 DIAGNOSIS — K219 Gastro-esophageal reflux disease without esophagitis: Secondary | ICD-10-CM | POA: Diagnosis not present

## 2024-06-01 DIAGNOSIS — J309 Allergic rhinitis, unspecified: Secondary | ICD-10-CM | POA: Diagnosis not present

## 2024-06-01 DIAGNOSIS — E785 Hyperlipidemia, unspecified: Secondary | ICD-10-CM | POA: Diagnosis not present

## 2024-06-01 DIAGNOSIS — K760 Fatty (change of) liver, not elsewhere classified: Secondary | ICD-10-CM

## 2024-06-01 DIAGNOSIS — Z638 Other specified problems related to primary support group: Secondary | ICD-10-CM

## 2024-06-01 DIAGNOSIS — Z0000 Encounter for general adult medical examination without abnormal findings: Secondary | ICD-10-CM

## 2024-06-01 DIAGNOSIS — J439 Emphysema, unspecified: Secondary | ICD-10-CM

## 2024-06-01 DIAGNOSIS — E559 Vitamin D deficiency, unspecified: Secondary | ICD-10-CM | POA: Diagnosis not present

## 2024-06-01 DIAGNOSIS — E1169 Type 2 diabetes mellitus with other specified complication: Secondary | ICD-10-CM | POA: Diagnosis not present

## 2024-06-01 DIAGNOSIS — M85852 Other specified disorders of bone density and structure, left thigh: Secondary | ICD-10-CM | POA: Diagnosis not present

## 2024-06-01 DIAGNOSIS — N393 Stress incontinence (female) (male): Secondary | ICD-10-CM

## 2024-06-01 DIAGNOSIS — H6123 Impacted cerumen, bilateral: Secondary | ICD-10-CM | POA: Diagnosis not present

## 2024-06-01 DIAGNOSIS — I10 Essential (primary) hypertension: Secondary | ICD-10-CM | POA: Diagnosis not present

## 2024-06-01 DIAGNOSIS — R911 Solitary pulmonary nodule: Secondary | ICD-10-CM

## 2024-06-01 DIAGNOSIS — G8929 Other chronic pain: Secondary | ICD-10-CM

## 2024-06-01 DIAGNOSIS — T466X5A Adverse effect of antihyperlipidemic and antiarteriosclerotic drugs, initial encounter: Secondary | ICD-10-CM

## 2024-06-01 MED ORDER — PIOGLITAZONE HCL 15 MG PO TABS: 15.0000 mg | ORAL_TABLET | Freq: Every day | ORAL | 4 refills | Status: AC

## 2024-06-01 MED ORDER — MONTELUKAST SODIUM 10 MG PO TABS
ORAL_TABLET | ORAL | 4 refills | Status: AC
Start: 2024-06-01 — End: ?

## 2024-06-01 MED ORDER — FLUOXETINE HCL 40 MG PO CAPS: 40.0000 mg | ORAL_CAPSULE | Freq: Every day | ORAL | 3 refills | Status: AC

## 2024-06-01 MED ORDER — VITAMIN D (ERGOCALCIFEROL) 1.25 MG (50000 UNIT) PO CAPS: 50000.0000 [IU] | ORAL_CAPSULE | ORAL | 1 refills | Status: DC

## 2024-06-01 MED ORDER — ESOMEPRAZOLE MAGNESIUM 40 MG PO CPDR: 40.0000 mg | DELAYED_RELEASE_CAPSULE | Freq: Every day | ORAL | 1 refills | Status: DC

## 2024-06-01 MED ORDER — IRON (FERROUS SULFATE) 325 (65 FE) MG PO TABS: 325.0000 mg | ORAL_TABLET | ORAL | Status: AC

## 2024-06-01 MED ORDER — METFORMIN HCL 500 MG PO TABS: 500.0000 mg | ORAL_TABLET | Freq: Two times a day (BID) | ORAL | 4 refills | Status: AC

## 2024-06-01 MED ORDER — VALSARTAN 160 MG PO TABS: 160.0000 mg | ORAL_TABLET | Freq: Every day | ORAL | 4 refills | Status: AC

## 2024-06-01 MED ORDER — VITAMIN B-12 1000 MCG PO TABS: 1000.0000 ug | ORAL_TABLET | Freq: Every day | ORAL | Status: AC

## 2024-06-01 MED ORDER — CYANOCOBALAMIN 1000 MCG/ML IJ SOLN: 1000.0000 ug | Freq: Once | INTRAMUSCULAR | Status: AC

## 2024-06-01 MED ORDER — TIZANIDINE HCL 2 MG PO TABS: ORAL_TABLET | ORAL | 3 refills | Status: DC

## 2024-06-01 MED ORDER — GABAPENTIN 300 MG PO CAPS: 300.0000 mg | ORAL_CAPSULE | Freq: Every day | ORAL | 3 refills | Status: AC

## 2024-06-01 MED ORDER — HYDROCHLOROTHIAZIDE 12.5 MG PO CAPS: 12.5000 mg | ORAL_CAPSULE | Freq: Every day | ORAL | 4 refills | Status: AC

## 2024-06-01 MED ORDER — FLUTICASONE PROPIONATE 50 MCG/ACT NA SUSP: 2.0000 | Freq: Every day | NASAL | 4 refills | Status: AC

## 2024-06-01 NOTE — Patient Instructions (Addendum)
 Change pantoprazole  to nexium 40mg  daily.  Increase prozac  to 40mg  daily  Work on advanced directive/living will.  Look into personal care services for mom.  Use kegel exercises to strengthen pelvic floor muscles.  Increase vitamin D  replacement to 50k units weekly prescription sent to pharmacy. Use for 6 months then return to 2000 units daily  B12 shot today then start oral b12 1000mcg daily  OTC Start iron  tablets 325mg  ferrous sulfate  (65FE) every other day.  Return in 6 months for follow up visit and 1 year for next physical.   Increase gabapentin  to 300mg  nightly. Ear wash today was unsuccessful - will refer to ENT. She has already tried bebrox at home Try muscle relaxant, ice to neck, gentle stretching for neck pain with radiculopathy. Let us  know if not better with this.

## 2024-06-01 NOTE — Assessment & Plan Note (Signed)
 Encouraged she finish this.

## 2024-06-01 NOTE — Progress Notes (Signed)
 Ph: [phone] Fax: [phone]   Patient ID: Olivia Benton Ped, female    DOB: 28-Mar-1956, 68 y.o.   MRN: [id number]  This visit was conducted in person.  BP 138/78   Pulse 66   Temp 98.4 F (36.9 C) (Oral)   Ht 4' 9.5 (1.461 m)   Wt 157 lb 6 oz (71.4 kg)   SpO2 97%   BMI 33.47 kg/m    CC: CPE Subjective:   HPI: Olivia Benton is a 68 y.o. female presenting on 06/01/2024 for Annual Exam (MCR prt 2 [AWV- 01/16/24].)   Saw health advisor 12/2023 for medicare wellness visit. Note reviewed.   No results found.  Flowsheet Row Office Visit from 06/01/2024 in [facility] [place]  PHQ-2 Total Score 0       06/01/2024   12:00 PM 01/16/2024    3:10 PM 09/17/2023    2:23 PM 03/18/2023   12:17 PM 01/10/2023    2:32 PM  Fall Risk   Falls in the past year? 0 0 0 0 0  Number falls in past yr:  0  0 0  Injury with Fall?  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    R neck pain with radiation down arm. Denies inciting trauma/injury or falls.   Diabetic with statin intolerance (leg cramping, muscle pains) - takes lovastatin  once weekly. Trulicity  caused intolerable GI side effects. Continues actos  15mg  daily along with metformin .   GERD - manages with pantoprazole  40mg  daily. Notes LLQ cramping since starting this medicine.   Known CAD with CAC 212 (85% for age) - now on lovastatin  40mg  twice weekly + zetia  10mg . Due to see cards   Preventative: COLONOSCOPY WITH PROPOFOL  01/15/2023 - TA x2, diverticulosis, rpt 7 yrs Renne, Darren, MD)  ESOPHAGOGASTRODUODENOSCOPY 01/15/2023 - small HH otherwise WNL Renne, Darren, MD)  Mammo 05/2023 - Birads1 @ [place]  Well woman exam - pap WNL  2013. S/p complete hysterectomy with BSO 1991 (68yo) for endometriosis. She did have HRT for a year. H/o DVT as teenager when on OCP. Early surgical menopause.  DEXA - DUE. # provided to call and schedule Lung cancer screening - ~30 PY history. yearly lung CT, first 03/2022 showing LAD CAD and emphysema. Latest CT 04/2024 showed 6.28mm solid component nodule to LLL - planned rpt CT 3-4 months.  Flu shot yearly  Covid vaccine - Moderna 02/2020, 03/2020, no  boosters Tetanus shot - declines  Pneumovax - 08/2017, Prevnar 20 - 12/2021 Shingrix - 03/2023, 05/2023 Advanced directive discussion - has not set up. Discussed. HCPOA would be local daughter (Sheryl Crupton). Advanced directive packet previously provided.  Seat belt use discussed  Sunscreen use discussed, no changing moles  Ex smoker - quit 2010, prior ~30 PY hx. Children still smoke  Alcohol - none  Dentist - has not seen recently  Eye exam yearly Bowel - no constipation  Bladder - stress incontinence symptoms - wears pull up. H/o complete hysterectomy 1991  Lives alone, no pets Occ: [facility] Activity: no regular exercise - she does have stationary bicycle but hasn't used Diet: good water, fruits/vegetables daily     Relevant past medical, surgical, family  and social history reviewed and updated as indicated. Interim medical history since our last visit reviewed. Allergies and medications reviewed and updated. Outpatient Medications Prior to Visit  Medication Sig Dispense Refill   aspirin  EC 81 MG tablet Take 1 tablet (81 mg total) by mouth daily. Swallow whole. 90 tablet 3   Blood Glucose Monitoring Suppl (ONE TOUCH ULTRA MINI) w/Device KIT Use as instructed to check blood sugar once daily. 1 kit 0   Cholecalciferol (VITAMIN D ) 50 MCG (2000 UT) CAPS Take 1 capsule (2,000 Units total) by mouth daily. 30 capsule    ezetimibe  (ZETIA ) 10 MG tablet TAKE 1 TABLET BY MOUTH EVERY DAY 90 tablet 3   glucose blood (ONETOUCH ULTRA) test strip Use as  instructed to check blood sugar once a day 100 strip 3   levocetirizine (XYZAL) 5 MG tablet Take 5 mg by mouth every evening.     lovastatin  (MEVACOR ) 20 MG tablet Take 1 tablet (20 mg total) by mouth 2 (two) times a week. 26 tablet 4   Multiple Minerals-Vitamins (CALCIUM & VIT D3 BONE HEALTH PO) Take by mouth daily.     OneTouch Delica Lancets 33G MISC Use to check sugar once daily. Dx: E11.9 100 each 3   FLUoxetine  (PROZAC ) 20 MG capsule TAKE 1 CAPSULE BY MOUTH EVERY DAY 90 capsule 4   fluticasone  (FLONASE ) 50 MCG/ACT nasal spray USE 2 SPRAYS IN BOTH  NOSTRILS DAILY 48 g 2   gabapentin  (NEURONTIN ) 100 MG capsule TAKE 1-2 CAPSULES (100-200 MG TOTAL) BY MOUTH AT BEDTIME. 60 capsule 6   hydrochlorothiazide  (MICROZIDE ) 12.5 MG capsule TAKE 1 CAPSULE BY MOUTH EVERY DAY 90 capsule 0   metFORMIN  (GLUCOPHAGE ) 500 MG tablet TAKE 1 TABLET BY MOUTH 2 TIMES DAILY WITH A MEAL. 180 tablet 0   montelukast  (SINGULAIR ) 10 MG tablet TAKE 1 TABLET BY MOUTH EVERYDAY AT BEDTIME 90 tablet 0   pantoprazole  (PROTONIX ) 40 MG tablet TAKE 1 TABLET BY MOUTH EVERY DAY 90 tablet 0   pioglitazone  (ACTOS ) 15 MG tablet TAKE 1 TABLET (15 MG TOTAL) BY MOUTH DAILY. 90 tablet 4   tiZANidine  (ZANAFLEX ) 2 MG tablet TAKE 1 TABLET BY MOUTH TWICE A DAY AS NEEDED FOR MUSCLE SPASM 180 tablet 1   valsartan  (DIOVAN ) 160 MG tablet TAKE 1 TABLET BY MOUTH EVERY DAY 90 tablet 0   metoprolol  tartrate (LOPRESSOR ) 100 MG tablet Take 1 tablet (100 mg total) by mouth once for 1 dose. 1 tablet 0   No facility-administered medications prior to visit.     Per HPI unless specifically indicated in ROS section below Review of Systems  Constitutional:  Negative for activity change, appetite change, chills, fatigue, fever and unexpected weight change.  HENT:  Negative for hearing loss.   Eyes:  Negative for visual disturbance.  Respiratory:  Positive for cough. Negative for chest tightness, shortness of breath and wheezing.   Cardiovascular:   Positive for leg swelling. Negative for chest pain and palpitations.  Gastrointestinal:  Positive for abdominal pain (LLQ cramping). Negative for abdominal distention, blood in stool, constipation, diarrhea, nausea and vomiting.  Genitourinary:  Negative for difficulty urinating and hematuria.  Musculoskeletal:  Negative for arthralgias, myalgias and neck pain.  Skin:  Negative for rash.  Neurological:  Negative for dizziness, seizures, syncope and headaches.  Hematological:  Negative for adenopathy. Does not bruise/bleed easily.  Psychiatric/Behavioral:  Negative for dysphoric mood. The patient is not nervous/anxious.     Objective:  BP 138/78   Pulse 66  Temp 98.4 F (36.9 C) (Oral)   Ht 4' 9.5 (1.461 m)   Wt 157 lb 6 oz (71.4 kg)   SpO2 97%   BMI 33.47 kg/m   Wt Readings from Last 3 Encounters:  06/01/24 157 lb 6 oz (71.4 kg)  01/16/24 151 lb (68.5 kg)  09/17/23 151 lb 4 oz (68.6 kg)      Physical Exam Vitals and nursing note reviewed.  Constitutional:      Appearance: Normal appearance. She is not ill-appearing.  HENT:     Head: Normocephalic and atraumatic.     Right Ear: Tympanic membrane, ear canal and external ear normal. There is impacted cerumen.     Left Ear: Tympanic membrane, ear canal and external ear normal. There is impacted cerumen.     Mouth/Throat:     Mouth: Mucous membranes are moist.     Pharynx: Oropharynx is clear. No oropharyngeal exudate or posterior oropharyngeal erythema.   Eyes:     General:        Right eye: No discharge.        Left eye: No discharge.     Extraocular Movements: Extraocular movements intact.     Conjunctiva/sclera: Conjunctivae normal.     Pupils: Pupils are equal, round, and reactive to light.   Neck:     Thyroid : No thyroid  mass or thyromegaly.     Vascular: No carotid bruit.   Cardiovascular:     Rate and Rhythm: Normal rate and regular rhythm.     Pulses: Normal pulses.     Heart sounds: Normal heart sounds. No  murmur heard. Pulmonary:     Effort: Pulmonary effort is normal. No respiratory distress.     Breath sounds: Normal breath sounds. No wheezing, rhonchi or rales.  Abdominal:     General: Bowel sounds are normal. There is no distension.     Palpations: Abdomen is soft. There is no mass.     Tenderness: There is no abdominal tenderness. There is no guarding or rebound.     Hernia: No hernia is present.   Musculoskeletal:     Cervical back: Normal range of motion and neck supple. No rigidity.     Right lower leg: No edema.     Left lower leg: No edema.  Lymphadenopathy:     Cervical: No cervical adenopathy.   Skin:    General: Skin is warm and dry.     Findings: No rash.   Neurological:     General: No focal deficit present.     Mental Status: She is alert. Mental status is at baseline.   Psychiatric:        Mood and Affect: Mood normal.        Behavior: Behavior normal.     S/p unsuccessful bilateral ear cerumen irrigation  with residual small bleed at R canal after irrigation, unable to visualize source or evidence of blood on ear speculum exam. She notes R ear hearing actually somewhat improved after irrigation.     Results for orders placed or performed in visit on 05/25/24  IBC panel   Collection Time: 05/25/24  9:38 AM  Result Value Ref Range   Iron  46 42 - 145 ug/dL   Transferrin 716.9 787.9 - 360.0 mg/dL   Saturation Ratios 88.3 (L) 20.0 - 50.0 %   TIBC 396.2 250.0 - 450.0 mcg/dL  Ferritin   Collection Time: 05/25/24  9:38 AM  Result Value Ref Range   Ferritin 8.9 (L) 10.0 -  291.0 ng/mL  CK   Collection Time: 05/25/24  9:38 AM  Result Value Ref Range   Total CK 63 7 - 177 U/L  VITAMIN D  25 Hydroxy (Vit-D Deficiency, Fractures)   Collection Time: 05/25/24  9:38 AM  Result Value Ref Range   VITD 22.58 (L) 30.00 - 100.00 ng/mL  Vitamin B12   Collection Time: 05/25/24  9:38 AM  Result Value Ref Range   Vitamin B-12 181 (L) 211 - 911 pg/mL  Comprehensive  metabolic panel   Collection Time: 05/25/24  9:38 AM  Result Value Ref Range   Sodium 141 135 - 145 mEq/L   Potassium 3.9 3.5 - 5.1 mEq/L   Chloride 101 96 - 112 mEq/L   CO2 31 19 - 32 mEq/L   Glucose, Bld 102 (H) 70 - 99 mg/dL   BUN 23 6 - 23 mg/dL   Creatinine, Ser 9.06 0.40 - 1.20 mg/dL   Total Bilirubin 0.3 0.2 - 1.2 mg/dL   Alkaline Phosphatase 46 39 - 117 U/L   AST 19 0 - 37 U/L   ALT 25 0 - 35 U/L   Total Protein 7.0 6.0 - 8.3 g/dL   Albumin 4.3 3.5 - 5.2 g/dL   GFR 37.11 >39.99 mL/min   Calcium 9.3 8.4 - 10.5 mg/dL  Lipid panel   Collection Time: 05/25/24  9:38 AM  Result Value Ref Range   Cholesterol 118 0 - 200 mg/dL   Triglycerides 866.9 0.0 - 149.0 mg/dL   HDL 71.39 (L) >60.99 mg/dL   VLDL 73.3 0.0 - 59.9 mg/dL   LDL Cholesterol 62 0 - 99 mg/dL   Total CHOL/HDL Ratio 4    NonHDL 88.93   Microalbumin / creatinine urine ratio   Collection Time: 05/25/24  9:38 AM  Result Value Ref Range   Microalb, Ur 3.0 (H) 0.0 - 1.9 mg/dL   Creatinine,U 886.0 mg/dL   Microalb Creat Ratio 26.0 0.0 - 30.0 mg/g  Hemoglobin A1c   Collection Time: 05/25/24  9:38 AM  Result Value Ref Range   Hgb A1c MFr Bld 6.8 (H) 4.6 - 6.5 %   Lab Results  Component Value Date   WBC 6.3 10/22/2022   HGB 12.2 10/22/2022   HCT 36.8 10/22/2022   MCV 85.6 10/22/2022   PLT 297.0 10/22/2022    Assessment & Plan:   Problem List Items Addressed This Visit     Health maintenance examination - Primary (Chronic)   Preventative protocols reviewed and updated unless pt declined. Discussed healthy diet and lifestyle.       Advanced care planning/counseling discussion (Chronic)   Encouraged she finish this.       Type 2 diabetes mellitus with other specified complication (HCC)   Chronic, stable on current regimen of metformin  and actos  - continue.  H/o trulicity  intolerance.       Relevant Medications   metFORMIN  (GLUCOPHAGE ) 500 MG tablet   pioglitazone  (ACTOS ) 15 MG tablet   valsartan   (DIOVAN ) 160 MG tablet   Hypertension   Chronic, stable. Continue current regimen.       Relevant Medications   hydrochlorothiazide  (MICROZIDE ) 12.5 MG capsule   valsartan  (DIOVAN ) 160 MG tablet   GERD (gastroesophageal reflux disease)   Chronic, pantoprazole  40mg  may be causing LLQ abd discomfort - will trial nexium 40mg  daily.  Previously omeprazole  was not sufficient to control symptoms      Relevant Medications   esomeprazole (NEXIUM) 40 MG capsule   Chronic low back pain  Continue tizanidine  PRN.       Relevant Medications   gabapentin  (NEURONTIN ) 300 MG capsule   tiZANidine  (ZANAFLEX ) 2 MG tablet   FLUoxetine  (PROZAC ) 40 MG capsule   Allergic rhinitis   Relevant Medications   fluticasone  (FLONASE ) 50 MCG/ACT nasal spray   Obesity, Class I, BMI 30-34.9   Weight gain noted. Encourage healthy diet and lifestyle changes to affect sustainable weight loss. Difficulty making healthy lifestyle choices due to being mother's caregiver.       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 Discussed this.  On prozac  - increase to 40mg  daily.  Encouraged she look into personal care services. Hospice now involved       Relevant Medications   FLUoxetine  (PROZAC ) 40 MG capsule   Dyslipidemia associated with type 2 diabetes mellitus (HCC)   Continues twice weekly lovastatin  with zetia  10mg  daily.  LDL 62, nonHDL 88 - overall at goal.  Could consider PCSK9i.  The ASCVD Risk score (Arnett DK, et al., 2019) failed to calculate for the following reasons:   The valid total cholesterol range is 130 to 320 mg/dL       Relevant Medications   metFORMIN  (GLUCOPHAGE ) 500 MG tablet   pioglitazone  (ACTOS ) 15 MG tablet   valsartan  (DIOVAN ) 160 MG tablet   Vitamin D  deficiency   Levels remain low despite 2000 units daily - will Rx 50k D2 weekly x6 months then return to 2000 international units  daily.       Statin myopathy   Continues twice weekly  lovastatin  with zetia  10mg  daily.        Peripheral neuropathy   Chronic, has  been on gabapentin  100mg  nightly - will increase to 300mg  nightly due to noted R neck pain presumed cervical radiculopathy.       Relevant Medications   gabapentin  (NEURONTIN ) 300 MG capsule   tiZANidine  (ZANAFLEX ) 2 MG tablet   FLUoxetine  (PROZAC ) 40 MG capsule   Ex-smoker   Quit 2010 Continue lung cancer screening CT      CAD (coronary artery disease)   Cardiac CT with CAC 212 (85% for age) - with LAD disease, now followed by cardiology on lovastatin  and zetia . Encouraged schedule f/u.       Relevant Medications   hydrochlorothiazide  (MICROZIDE ) 12.5 MG capsule   valsartan  (DIOVAN ) 160 MG tablet   COPD (chronic obstructive pulmonary disease) (HCC)   Incidentally noted on imaging. Pt overall asxs.       Relevant Medications   fluticasone  (FLONASE ) 50 MCG/ACT nasal spray   montelukast  (SINGULAIR ) 10 MG tablet   Metabolic dysfunction-associated fatty liver disease (MAFLD)   Continue actos .       Osteopenia   Due for rpt dexa - # provided to call and schedule.       Vitamin B12 deficiency   Low levels noted. She is on metformin . B12 shot today, then start oral b12 1000mcg daily.       Bilateral hearing loss due to cerumen impaction   S/p unsuccessful ear irrigation in office today She has already tried debrox at home.  Refer to ENT.      Relevant Orders   Ambulatory referral to ENT   Pulmonary nodule seen on imaging study   6.78mm solid nodule to LLL on lung CT 04/2024 - planned rpt 3-4 months      Iron  deficiency   Discussed starting oral iron  every other day Check CBC next labs.  Stress incontinence   Discussed pathophysiology as well as management options. Start with Kegel exercises which were reviewed. If worsening to consider GYN eval for vaginal pessary        Meds ordered this encounter  Medications   fluticasone  (FLONASE ) 50 MCG/ACT nasal spray    Sig: Place 2  sprays into both nostrils daily.    Dispense:  48 g    Refill:  4   gabapentin  (NEURONTIN ) 300 MG capsule    Sig: Take 1 capsule (300 mg total) by mouth at bedtime.    Dispense:  90 capsule    Refill:  3   hydrochlorothiazide  (MICROZIDE ) 12.5 MG capsule    Sig: Take 1 capsule (12.5 mg total) by mouth daily.    Dispense:  90 capsule    Refill:  4   metFORMIN  (GLUCOPHAGE ) 500 MG tablet    Sig: Take 1 tablet (500 mg total) by mouth 2 (two) times daily with a meal.    Dispense:  180 tablet    Refill:  4   montelukast  (SINGULAIR ) 10 MG tablet    Sig: TAKE 1 TABLET BY MOUTH EVERYDAY AT BEDTIME    Dispense:  90 tablet    Refill:  4   pioglitazone  (ACTOS ) 15 MG tablet    Sig: Take 1 tablet (15 mg total) by mouth daily.    Dispense:  90 tablet    Refill:  4   tiZANidine  (ZANAFLEX ) 2 MG tablet    Sig: TAKE 1 TABLET BY MOUTH TWICE A DAY AS NEEDED FOR MUSCLE SPASM    Dispense:  60 tablet    Refill:  3   valsartan  (DIOVAN ) 160 MG tablet    Sig: Take 1 tablet (160 mg total) by mouth daily.    Dispense:  90 tablet    Refill:  4   esomeprazole (NEXIUM) 40 MG capsule    Sig: Take 1 capsule (40 mg total) by mouth daily.    Dispense:  90 capsule    Refill:  1    In place of pantoprazole  due to intolerance   Vitamin D , Ergocalciferol , (DRISDOL) 1.25 MG (50000 UNIT) CAPS capsule    Sig: Take 1 capsule (50,000 Units total) by mouth every 7 (seven) days.    Dispense:  12 capsule    Refill:  1   cyanocobalamin  (VITAMIN B12) 1000 MCG tablet    Sig: Take 1 tablet (1,000 mcg total) by mouth daily.   Iron , Ferrous Sulfate , 325 (65 Fe) MG TABS    Sig: Take 325 mg by mouth every other day.   FLUoxetine  (PROZAC ) 40 MG capsule    Sig: Take 1 capsule (40 mg total) by mouth daily.    Dispense:  90 capsule    Refill:  3    Note new dose   cyanocobalamin  (VITAMIN B12) injection 1,000 mcg    Orders Placed This Encounter  Procedures   Ambulatory referral to ENT    Referral Priority:   Routine     Referral Type:   Consultation    Referral Reason:   Specialty Services Required    Requested Specialty:   Otolaryngology    Number of Visits Requested:   1    Patient Instructions  Change pantoprazole  to nexium 40mg  daily.  Increase prozac  to 40mg  daily  Work on advanced directive/living will.  Look into personal care services for mom.  Use kegel exercises to strengthen pelvic floor muscles.  Increase vitamin D  replacement to 50k units weekly prescription sent  to pharmacy. Use for 6 months then return to 2000 units daily  B12 shot today then start oral b12 1000mcg daily  OTC Start iron  tablets 325mg  ferrous sulfate  (65FE) every other day.  Return in 6 months for follow up visit and 1 year for next physical.   Increase gabapentin  to 300mg  nightly. Ear wash today was unsuccessful - will refer to ENT. She has already tried bebrox at home Try muscle relaxant, ice to neck, gentle stretching for neck pain with radiculopathy. Let us  know if not better with this.   Follow up plan: Return in about 6 months (around 12/01/2024), or if symptoms worsen or fail to improve, for follow up visit.  Anton Blas, MD

## 2024-06-01 NOTE — Assessment & Plan Note (Signed)
 Preventative protocols reviewed and updated unless pt declined. Discussed healthy diet and lifestyle.

## 2024-06-04 ENCOUNTER — Other Ambulatory Visit: Payer: Self-pay | Admitting: Family Medicine

## 2024-06-04 ENCOUNTER — Encounter: Payer: Self-pay | Admitting: Family Medicine

## 2024-06-04 DIAGNOSIS — N393 Stress incontinence (female) (male): Secondary | ICD-10-CM | POA: Insufficient documentation

## 2024-06-04 DIAGNOSIS — E1169 Type 2 diabetes mellitus with other specified complication: Secondary | ICD-10-CM

## 2024-06-04 DIAGNOSIS — R911 Solitary pulmonary nodule: Secondary | ICD-10-CM | POA: Insufficient documentation

## 2024-06-04 DIAGNOSIS — E538 Deficiency of other specified B group vitamins: Secondary | ICD-10-CM | POA: Insufficient documentation

## 2024-06-04 DIAGNOSIS — H6123 Impacted cerumen, bilateral: Secondary | ICD-10-CM | POA: Insufficient documentation

## 2024-06-04 DIAGNOSIS — E611 Iron deficiency: Secondary | ICD-10-CM | POA: Insufficient documentation

## 2024-06-04 NOTE — Assessment & Plan Note (Addendum)
 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 Discussed this.  On prozac  - increase to 40mg  daily.  Encouraged she look into personal care services. Hospice now involved

## 2024-06-04 NOTE — Assessment & Plan Note (Signed)
 Discussed pathophysiology as well as management options. Start with Kegel exercises which were reviewed. If worsening to consider GYN eval for vaginal pessary

## 2024-06-04 NOTE — Assessment & Plan Note (Signed)
 6.63mm solid nodule to LLL on lung CT 04/2024 - planned rpt 3-4 months

## 2024-06-04 NOTE — Assessment & Plan Note (Signed)
 Quit 2010 Continue lung cancer screening CT

## 2024-06-04 NOTE — Assessment & Plan Note (Signed)
Continue actos.  

## 2024-06-04 NOTE — Assessment & Plan Note (Signed)
 Weight gain noted. Encourage healthy diet and lifestyle changes to affect sustainable weight loss. Difficulty making healthy lifestyle choices due to being mother's caregiver.

## 2024-06-04 NOTE — Assessment & Plan Note (Addendum)
 S/p unsuccessful ear irrigation in office today She has already tried debrox at home.  Refer to ENT.

## 2024-06-04 NOTE — Assessment & Plan Note (Addendum)
 Continues twice weekly lovastatin  with zetia  10mg  daily.

## 2024-06-04 NOTE — Assessment & Plan Note (Signed)
 Chronic, stable. Continue current regimen.

## 2024-06-04 NOTE — Assessment & Plan Note (Addendum)
 Chronic, has  been on gabapentin  100mg  nightly - will increase to 300mg  nightly due to noted R neck pain presumed cervical radiculopathy.

## 2024-06-04 NOTE — Assessment & Plan Note (Signed)
 Due for rpt dexa - # provided to call and schedule.

## 2024-06-04 NOTE — Assessment & Plan Note (Signed)
 Continues twice weekly lovastatin  with zetia  10mg  daily.  LDL 62, nonHDL 88 - overall at goal.  Could consider PCSK9i.  The ASCVD Risk score (Arnett DK, et al., 2019) failed to calculate for the following reasons:   The valid total cholesterol range is 130 to 320 mg/dL

## 2024-06-04 NOTE — Assessment & Plan Note (Signed)
 Continue tizanidine  PRN.

## 2024-06-04 NOTE — Assessment & Plan Note (Addendum)
 Chronic, pantoprazole  40mg  may be causing LLQ abd discomfort - will trial nexium 40mg  daily.  Previously omeprazole  was not sufficient to control symptoms

## 2024-06-04 NOTE — Assessment & Plan Note (Signed)
 Low levels noted. She is on metformin . B12 shot today, then start oral b12 1000mcg daily.

## 2024-06-04 NOTE — Assessment & Plan Note (Signed)
 Incidentally noted on imaging. Pt overall asxs.

## 2024-06-04 NOTE — Assessment & Plan Note (Signed)
 Chronic, stable on current regimen of metformin  and actos  - continue.  H/o trulicity  intolerance.

## 2024-06-04 NOTE — Assessment & Plan Note (Deleted)
 S/p unsuccessful ear irrigation.  Refer to ENT.  She has already tried debrox at home.

## 2024-06-04 NOTE — Assessment & Plan Note (Signed)
 Levels remain low despite 2000 units daily - will Rx 50k D2 weekly x6 months then return to 2000 international units  daily.

## 2024-06-04 NOTE — Assessment & Plan Note (Signed)
 Discussed starting oral iron  every other day Check CBC next labs.

## 2024-06-04 NOTE — Assessment & Plan Note (Addendum)
 Cardiac CT with CAC 212 (85% for age) - with LAD disease, now followed by cardiology on lovastatin  and zetia . Encouraged schedule f/u.

## 2024-06-11 ENCOUNTER — Other Ambulatory Visit: Payer: Self-pay | Admitting: Family Medicine

## 2024-06-11 DIAGNOSIS — G8929 Other chronic pain: Secondary | ICD-10-CM

## 2024-07-06 DIAGNOSIS — H93293 Other abnormal auditory perceptions, bilateral: Secondary | ICD-10-CM | POA: Diagnosis not present

## 2024-07-06 DIAGNOSIS — H6123 Impacted cerumen, bilateral: Secondary | ICD-10-CM | POA: Diagnosis not present

## 2024-07-13 ENCOUNTER — Other Ambulatory Visit: Payer: Self-pay | Admitting: Family Medicine

## 2024-07-13 DIAGNOSIS — M545 Low back pain, unspecified: Secondary | ICD-10-CM

## 2024-07-27 ENCOUNTER — Ambulatory Visit
Admission: RE | Admit: 2024-07-27 | Discharge: 2024-07-27 | Disposition: A | Discharge status: Home / Self Care | Source: Ambulatory Visit | Attending: Acute Care | Admitting: Acute Care

## 2024-07-27 DIAGNOSIS — R911 Solitary pulmonary nodule: Secondary | ICD-10-CM

## 2024-07-27 DIAGNOSIS — J432 Centrilobular emphysema: Secondary | ICD-10-CM | POA: Diagnosis not present

## 2024-07-27 DIAGNOSIS — Z87891 Personal history of nicotine dependence: Secondary | ICD-10-CM

## 2024-08-27 ENCOUNTER — Other Ambulatory Visit: Payer: Self-pay | Admitting: Family Medicine

## 2024-08-27 DIAGNOSIS — M545 Low back pain, unspecified: Secondary | ICD-10-CM

## 2024-09-18 ENCOUNTER — Encounter: Payer: Self-pay | Admitting: Family Medicine

## 2024-09-22 DIAGNOSIS — H35371 Puckering of macula, right eye: Secondary | ICD-10-CM | POA: Diagnosis not present

## 2024-09-22 DIAGNOSIS — D3131 Benign neoplasm of right choroid: Secondary | ICD-10-CM | POA: Diagnosis not present

## 2024-09-22 DIAGNOSIS — H43812 Vitreous degeneration, left eye: Secondary | ICD-10-CM | POA: Diagnosis not present

## 2024-09-22 DIAGNOSIS — E119 Type 2 diabetes mellitus without complications: Secondary | ICD-10-CM | POA: Diagnosis not present

## 2024-09-22 DIAGNOSIS — H43821 Vitreomacular adhesion, right eye: Secondary | ICD-10-CM | POA: Diagnosis not present

## 2024-09-22 DIAGNOSIS — H2513 Age-related nuclear cataract, bilateral: Secondary | ICD-10-CM | POA: Diagnosis not present

## 2024-09-30 ENCOUNTER — Ambulatory Visit: Payer: Self-pay

## 2024-09-30 NOTE — Telephone Encounter (Signed)
 Reason for Disposition  [1] HIGH RISK patient (e.g., weak immune system, 65 years and older, obesity with BMI 30 or higher, pregnant, chronic lung disease) AND [2] COVID symptoms (e.g., cough, fever)  (Exceptions: Already seen by PCP and no new or worsening symptoms.)  Answer Assessment - Initial Assessment Questions Pt has been taking cough medicine and tylenol /ibuprofen for fever  1. SYMPTOMS: What is your main symptom or concern? (e.g., cough, fever, shortness of breath, muscle aches)     Cough and shortness of breath with laying down 2. ONSET: When did the symptoms start?      Sore throat Monday- started feeling worse 3. COUGH: Do you have a cough? If Yes, ask: How bad is the cough?       Yesterday- a little better today 4. FEVER: Do you have a fever? If Yes, ask: What is your temperature, how was it measured, and when did it start?     Yesterday- none today 5. BREATHING DIFFICULTY: Are you having any difficulty breathing? (e.g., normal; shortness of breath, wheezing, unable to speak)      Shortness of breath and wheezing when laying down.  6. BETTER-SAME-WORSE: Are you getting better, staying the same or getting worse compared to yesterday?  If getting worse, ask, In what way?     A little better than yesterday 7. OTHER SYMPTOMS: Do you have any other symptoms?  (e.g., chills, fatigue, headache, loss of smell or taste, muscle pain, sore throat)     Headache,fatigue, muscle aches, sore throat,  8. COVID-19 DIAGNOSIS: How do you know that you have COVID? (e.g., positive lab test or self-test, diagnosed by doctor or NP/PA, symptoms after exposure).     Home positive   10. COVID-19 VACCINE: Have you had the COVID-19 vaccine? If Yes, ask: When did you last get it?       No  11. HIGH RISK DISEASE: Do you have any chronic medical problems? (e.g., asthma, heart or lung disease, weak immune system, obesity, etc.)       Asthma when she gets sick  Protocols used:  COVID-19 - Diagnosed or Suspected-A-AH

## 2024-09-30 NOTE — Telephone Encounter (Signed)
 FYI Only or Action Required?: Action required by provider: clinical question for provider.  Patient was last seen in primary care on 06/01/2024 by Rilla Baller, MD.  Called Nurse Triage reporting Covid Positive.  Symptoms began several days ago.  Interventions attempted: OTC medications: tylenol , ibuprofen, cough medicine.  Symptoms are: unchanged.  Triage Disposition: Call PCP Within 24 Hours  Patient/caregiver understands and will follow disposition?: No, wishes to speak with PCP   Pt is having shortness of breath/wheezing ONLY whn laying down. Pt doesn't want to come in for an appt is asking for something to be called in.

## 2024-10-01 ENCOUNTER — Telehealth: Payer: Self-pay

## 2024-10-01 ENCOUNTER — Ambulatory Visit (INDEPENDENT_AMBULATORY_CARE_PROVIDER_SITE_OTHER)

## 2024-10-01 VITALS — BP 138/76 | HR 85 | Temp 98.5°F | Resp 24 | Ht <= 58 in | Wt 160.0 lb

## 2024-10-01 DIAGNOSIS — U071 COVID-19: Secondary | ICD-10-CM

## 2024-10-01 MED ORDER — NIRMATRELVIR/RITONAVIR (PAXLOVID)TABLET: 3.0000 | ORAL_TABLET | Freq: Two times a day (BID) | ORAL | 0 refills | Status: AC

## 2024-10-01 MED ORDER — NIRMATRELVIR/RITONAVIR (PAXLOVID)TABLET: 3.0000 | ORAL_TABLET | Freq: Two times a day (BID) | ORAL | 0 refills | Status: DC

## 2024-10-01 MED ORDER — BENZONATATE 200 MG PO CAPS: 200.0000 mg | ORAL_CAPSULE | Freq: Three times a day (TID) | ORAL | 0 refills | Status: AC | PRN

## 2024-10-01 MED ORDER — ALBUTEROL SULFATE HFA 108 (90 BASE) MCG/ACT IN AERS: 2.0000 | INHALATION_SPRAY | RESPIRATORY_TRACT | 0 refills | Status: AC | PRN

## 2024-10-01 NOTE — Progress Notes (Signed)
 Subjective:   This visit was conducted in person. The patient gave informed consent to the use of Abridge AI technology to record the contents of the encounter as documented below.   Patient ID: Olivia Benton, female    DOB: 1956-07-29, 68 y.o.   MRN: [id number]   Discussed the use of AI scribe software for clinical note transcription with the patient, who gave verbal consent to proceed.  History of Present Illness Olivia Benton is a 68 year old female who presents with COVID-19 symptoms.  She tested positive for COVID-19 with a home test on September 29, 2024, after symptoms began on September 28, 2024. Initial symptoms included a sore throat, followed by a cough and headache on October 14. She also experienced body aches and a fever, with the highest recorded temperature being 100F on October 14. No chills, nausea, vomiting, or diarrhea. Currently, she has persistent coughing, wheezing, and headache.  She has a history of COVID-19 infection three years ago, managed at home. She has not received any COVID-19 boosters, only the initial two doses of the vaccine. She has been taking Mucinex  and Robitussin for her symptoms, but they are not effective. She experiences difficulty breathing when lying down, a new symptom since contracting COVID-19. She ordered a Primamist inhaler due to the lack of an albuterol  inhaler, which she used in the past for COPD but has not needed since quitting smoking in 2010.  She has a past medical history of COPD, which she reports was diagnosed a long time ago. She used to smoke but quit in 2010, which improved her breathing significantly. She has not used inhalers regularly since then, except during illnesses. Her breathing worsens when she is sick.  She lives alone and has been isolating since her mother's death on 2024-09-23. She recently went camping with a friend and visited stores. She has no known kidney issues and denies any recent exposure to  sick individuals.     Review of Systems  All other systems reviewed and are negative.       Allergies  Allergen Reactions   Amoxicillin     Biaxin [Clarithromycin]    Gold-Containing Drug Products    Latex    Statins     Crestor, lipitor   Other Hives and Rash    Rubber    Current Outpatient Medications on File Prior to Visit  Medication Sig Dispense Refill   Blood Glucose Monitoring Suppl (ONE TOUCH ULTRA MINI) w/Device KIT Use as instructed to check blood sugar once daily. 1 kit 0   cyanocobalamin  (VITAMIN B12) 1000 MCG tablet Take 1 tablet (1,000 mcg total) by mouth daily.     esomeprazole  (NEXIUM ) 40 MG capsule Take 1 capsule (40 mg total) by mouth daily. 90 capsule 1   ezetimibe  (ZETIA ) 10 MG tablet TAKE 1 TABLET BY MOUTH EVERY DAY 90 tablet 3   FLUoxetine  (PROZAC ) 40 MG capsule Take 1 capsule (40 mg total) by mouth daily. 90 capsule 3   fluticasone  (FLONASE ) 50 MCG/ACT nasal spray Place 2 sprays into both nostrils daily. 48 g 4   gabapentin  (NEURONTIN ) 300 MG capsule Take 1 capsule (300 mg total) by mouth at bedtime. 90 capsule 3   glucose blood (ONETOUCH ULTRA) test strip Use as instructed to check blood sugar once a day 100 strip 3   hydrochlorothiazide  (MICROZIDE ) 12.5 MG capsule Take 1 capsule (12.5 mg total) by mouth daily. 90 capsule 4   levocetirizine (XYZAL) 5 MG tablet Take  5 mg by mouth every evening.     lovastatin  (MEVACOR ) 20 MG tablet TAKE 1 TABLET (20 MG TOTAL) BY MOUTH 2 TIMES A WEEK 26 tablet 4   metFORMIN  (GLUCOPHAGE ) 500 MG tablet Take 1 tablet (500 mg total) by mouth 2 (two) times daily with a meal. 180 tablet 4   montelukast  (SINGULAIR ) 10 MG tablet TAKE 1 TABLET BY MOUTH EVERYDAY AT BEDTIME 90 tablet 4   OneTouch Delica Lancets 33G MISC Use to check sugar once daily. Dx: E11.9 100 each 3   pioglitazone  (ACTOS ) 15 MG tablet Take 1 tablet (15 mg total) by mouth daily. 90 tablet 4   tiZANidine  (ZANAFLEX ) 2 MG tablet TAKE 1 TABLET BY MOUTH TWICE A DAY AS  NEEDED FOR MUSCLE SPASMS 180 tablet 1   valsartan  (DIOVAN ) 160 MG tablet Take 1 tablet (160 mg total) by mouth daily. 90 tablet 4   Vitamin D , Ergocalciferol , (DRISDOL ) 1.25 MG (50000 UNIT) CAPS capsule Take 1 capsule (50,000 Units total) by mouth every 7 (seven) days. 12 capsule 1   aspirin  EC 81 MG tablet Take 1 tablet (81 mg total) by mouth daily. Swallow whole. (Patient not taking: Reported on 10/01/2024) 90 tablet 3   Cholecalciferol (VITAMIN D ) 50 MCG (2000 UT) CAPS Take 1 capsule (2,000 Units total) by mouth daily. (Patient not taking: Reported on 10/01/2024) 30 capsule    Iron , Ferrous Sulfate , 325 (65 Fe) MG TABS Take 325 mg by mouth every other day. (Patient not taking: Reported on 10/01/2024)     Multiple Minerals-Vitamins (CALCIUM & VIT D3 BONE HEALTH PO) Take by mouth daily. (Patient not taking: Reported on 10/01/2024)     No current facility-administered medications on file prior to visit.    BP 138/76 (BP Location: Left Arm, Patient Position: Sitting, Cuff Size: Large)   Pulse 85   Temp 98.5 F (36.9 C) (Oral)   Resp (!) 24   Ht 4' 9.5 (1.461 m)   Wt 160 lb (72.6 kg)   SpO2 97%   BMI 34.02 kg/m   Objective:      Physical Exam GENERAL: Alert, cooperative, well developed, no acute distress. HEAD: Normocephalic atraumatic. NOSE: No congestion or rhinorrhea, mucous membranes are moist. THROAT: No oropharyngeal exudate or posterior oropharyngeal erythema. CARDIOVASCULAR: Normal heart rate and rhythm, S1 and S2 normal without murmurs. CHEST: Diminished breath sounds bilaterally, no wheezes, rhonchi, or crackles. ABDOMEN: Soft, non tender, non distended, without organomegaly, normal bowel sounds. EXTREMITIES: No cyanosis or edema, varicose veins present. NEUROLOGICAL: Oriented to person, place and time, no gait abnormalities, moves all extremities without gross motor or sensory deficit.       Assessment & Plan:   Assessment & Plan COVID-19 infection Acute COVID-19  confirmed through home test. High risk for severe disease due to age, COPD, and weight. No recent boosters.  Will start Paxlovid  as below given that renal function is WNL.  Will hold statin due to medication interaction, patient to resume after completion of Paxlovid .  No other medication interactions per Medication review. Cardiorespiratory exam positive for diminished breath sounds, no notable wheezing, however, will send albuterol  and Tessalon  Perles for symptomatic relief.  - Start Paxlovid , 2 tablets of 150 mg and 1 tablet of 100 mg, twice daily for 5 days. - Hold lovastatin  during Paxlovid  treatment. - Prescribe Tessalon  Perles 200 mg, one capsule three times a day as needed for cough, for 14 days. - Advised on signs to seek emergency care: worsening shortness of breath, chest pain, severe  weakness, or dehydration. - Advised on alternating Tylenol  and ibuprofen for fever and headache management. - Encouraged fluid intake to prevent dehydration.    Return for worsening of symptoms or failure to improve.   Makella Buckingham K Kacee Sukhu, MD  10/01/24     Contains text generated by Abridge.

## 2024-10-01 NOTE — Patient Instructions (Addendum)
 Thank you for visiting [facility] today! Here's what we talked about:  - Start Paxlovid  and stop statin until you are done with Paxlovid  (Take nirmatrelvir  150 mg two tablets twice daily for 5 days and ritonavir  100 mg one tablet twice daily for 5 days)  - Use inhaler as needed and tessalon  pearles for cough -- Ibuprofen 600nmg every 8hrs as needed, alternate Tylenol  1000mg  every 8hours - ED if: worsening SOB, worsening weakness/fatigue or high fevers not responsive to tylenol /ibuprofen -If any meds need to be discontinued I will let you know -Drink lots of fluids!!

## 2024-10-01 NOTE — Telephone Encounter (Signed)
 Spoke to pt.

## 2024-10-01 NOTE — Telephone Encounter (Signed)
 please call patient and update as follows: I have reviewed her medication list and she should actually restart the lovastatin  5 days after she finishes the Paxlovid .  All her other medications are fine to take along side the Paxlovid .

## 2024-10-01 NOTE — Telephone Encounter (Addendum)
 Spoke with pt asking about sxs. C/o cough, wheezing, SOB, fever- max 100 and fatigue. Sxs started 09/28/24. Pos home Covid test- 10/14/285. Offered VV today at 12:40 with Dr Bennett. Pt prefers to come in for OV.   Held slot on Dr Charlyn schedule and messaged Amy to add pt to schedule.

## 2024-10-17 ENCOUNTER — Other Ambulatory Visit: Payer: Self-pay | Admitting: Family Medicine

## 2024-10-30 ENCOUNTER — Other Ambulatory Visit: Payer: Self-pay | Admitting: Family Medicine

## 2024-10-30 DIAGNOSIS — Z1231 Encounter for screening mammogram for malignant neoplasm of breast: Secondary | ICD-10-CM

## 2024-11-02 ENCOUNTER — Ambulatory Visit: Attending: Physician Assistant | Admitting: Physician Assistant

## 2024-11-02 ENCOUNTER — Encounter: Payer: Self-pay | Admitting: Physician Assistant

## 2024-11-02 VITALS — BP 120/64 | HR 64 | Ht <= 58 in | Wt 156.8 lb

## 2024-11-02 DIAGNOSIS — R0609 Other forms of dyspnea: Secondary | ICD-10-CM | POA: Diagnosis not present

## 2024-11-02 DIAGNOSIS — E785 Hyperlipidemia, unspecified: Secondary | ICD-10-CM | POA: Diagnosis not present

## 2024-11-02 DIAGNOSIS — R42 Dizziness and giddiness: Secondary | ICD-10-CM | POA: Diagnosis not present

## 2024-11-02 DIAGNOSIS — E1169 Type 2 diabetes mellitus with other specified complication: Secondary | ICD-10-CM | POA: Diagnosis not present

## 2024-11-02 DIAGNOSIS — I10 Essential (primary) hypertension: Secondary | ICD-10-CM | POA: Diagnosis not present

## 2024-11-02 DIAGNOSIS — I2510 Atherosclerotic heart disease of native coronary artery without angina pectoris: Secondary | ICD-10-CM | POA: Diagnosis not present

## 2024-11-02 NOTE — Progress Notes (Signed)
 Cardiology Office Note    Date:  11/02/2024   ID:  Olivia Benton, DOB 1956-02-13, MRN [id number]  PCP:  Olivia Baller, MD  Cardiologist:  Olivia Lunger, MD  Electrophysiologist:  None   Chief Complaint: Follow up  History of Present Illness:   Olivia Benton is a 68 y.o. female with history of T2DM, hypertension, GERD, hyperlipidemia with statin intolerance, peripheral neuropathy, nonobstructive CAD, COPD, pulmonary nodule, and iron  deficiency who presents for follow up on CAD.   Patient established with Dr. Gollan 06/04/2023 after referral from PCP for abnormal EKG.  She reported shortness of breath with exertion and occasional chest pain.  She had significant stress being the primary caretaker for her mother who has medical issues.  She was noted to have coronary calcium on CT.  CTA showed calcium score of 212 which was 85th percentile for age and sex matched control.  There was mild proximal LAD stenosis and minimal proximal LCx stenosis.  Patient presents today overall doing okay.  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  She then developed COVID in October and has since been recovering mentally and physically.  She reports mild shortness of breath since her COVID infection.  She also reports intermittent left-sided chest/rib pain.  She notices this that is mostly at night when she is laying down to sleep.  It is not reproducible to palpation and does not change with position.  Typically resolve spontaneously within a few minutes.  She has difficulty staying active since her mother passed away but denies exertional angina.  She has mild dyspnea on exertion.  She also notes lightheadedness especially with positional changes.  She also reports mild intermittent lower extremity swelling.  Denies dizziness, orthopnea, and PND.  Labs independently reviewed: 05/2024-A1c 6.8, TC 118, TG 133, HDL 28, LDL 62, sodium 141, potassium 3.9, BUN  23, creatinine 0.93, normal LFTs  Objective   Past Medical History:  Diagnosis Date   Asthma    extrinsic   Bilateral carpal tunnel syndrome 11/06/2016   Community acquired pneumonia 12/31/2016   Diabetes mellitus    non-insulin dependent   Foot fracture    History of deep vein thrombosis (DVT) of lower extremity 1974   after back surgery while on OCP   Hyperlipidemia    Hypertension    Rhinitis     Current Medications: Current Meds  Medication Sig   albuterol  (VENTOLIN  HFA) 108 (90 Base) MCG/ACT inhaler Inhale 2 puffs into the lungs every 4 (four) hours as needed for wheezing or shortness of breath.   aspirin  EC 81 MG tablet Take 1 tablet (81 mg total) by mouth daily. Swallow whole.   Blood Glucose Monitoring Suppl (ONE TOUCH ULTRA MINI) w/Device KIT Use as instructed to check blood sugar once daily.   Cholecalciferol (VITAMIN D ) 50 MCG (2000 UT) CAPS Take 1 capsule (2,000 Units total) by mouth daily.   cyanocobalamin  (VITAMIN B12) 1000 MCG tablet Take 1 tablet (1,000 mcg total) by mouth daily.   esomeprazole  (NEXIUM ) 40 MG capsule Take 1 capsule (40 mg total) by mouth daily.   ezetimibe  (ZETIA ) 10 MG tablet TAKE 1 TABLET BY MOUTH EVERY DAY   FLUoxetine  (PROZAC ) 40 MG capsule Take 1 capsule (40 mg total) by mouth daily.   gabapentin  (NEURONTIN ) 300 MG capsule Take 1 capsule (300 mg total) by mouth at bedtime.   hydrochlorothiazide  (MICROZIDE ) 12.5 MG capsule Take 1 capsule (12.5 mg total) by mouth  daily.   Iron , Ferrous Sulfate , 325 (65 Fe) MG TABS Take 325 mg by mouth every other day.   levocetirizine (XYZAL) 5 MG tablet Take 5 mg by mouth every evening.   lovastatin  (MEVACOR ) 20 MG tablet TAKE 1 TABLET (20 MG TOTAL) BY MOUTH 2 TIMES A WEEK   metFORMIN  (GLUCOPHAGE ) 500 MG tablet Take 1 tablet (500 mg total) by mouth 2 (two) times daily with a meal.   montelukast  (SINGULAIR ) 10 MG tablet TAKE 1 TABLET BY MOUTH EVERYDAY AT BEDTIME   OneTouch Delica Lancets 33G MISC Use to check  sugar once daily. Dx: E11.9   ONETOUCH ULTRA test strip USE AS INSTRUCTED TO CHECK BLOOD SUGAR ONCE A DAY   pioglitazone  (ACTOS ) 15 MG tablet Take 1 tablet (15 mg total) by mouth daily.   tiZANidine  (ZANAFLEX ) 2 MG tablet TAKE 1 TABLET BY MOUTH TWICE A DAY AS NEEDED FOR MUSCLE SPASMS   valsartan  (DIOVAN ) 160 MG tablet Take 1 tablet (160 mg total) by mouth daily.   Vitamin D , Ergocalciferol , (DRISDOL ) 1.25 MG (50000 UNIT) CAPS capsule Take 1 capsule (50,000 Units total) by mouth every 7 (seven) days.    Allergies:   Amoxicillin , Biaxin [clarithromycin], Gold-containing drug products, Latex, Statins, and Other   Social History   Socioeconomic History   Marital status: Divorced    Spouse name: Not on file   Number of children: Not on file   Years of education: Not on file   Highest education level: Associate degree: occupational, scientist, product/process development, or vocational program  Occupational History   Not on file  Tobacco Use   Smoking status: Former    Current packs/day: 0.00    Average packs/day: 1 pack/day for 44.0 years (44.0 ttl pk-yrs)    Types: Cigarettes    Start date: 10/17/1965    Quit date: 10/17/2009    Years since quitting: 15.0   Smokeless tobacco: Never  Vaping Use   Vaping status: Never Used  Substance and Sexual Activity   Alcohol use: No   Drug use: No   Sexual activity: Not on file  Other Topics Concern   Not on file  Social History Narrative   Lives alone, no pets   Mom Estela Holland passed away 2024/09/28   Occ: [employer]   Activity: no regular exercise   Diet: good water, fruits/vegetables daily   Social Drivers of Health   Financial Resource Strain: Low Risk  (01/16/2024)   Overall Financial Resource Strain (CARDIA)    Difficulty of Paying Living Expenses: Not hard at all  Food Insecurity: No Food Insecurity (01/16/2024)   Hunger Vital Sign    Worried About Running Out of Food in the Last Year: Never true    Ran Out of Food in the Last Year: Never true  Transportation  Needs: No Transportation Needs (01/16/2024)   PRAPARE - Administrator, Civil Service (Medical): No    Lack of Transportation (Non-Medical): No  Physical Activity: Inactive (01/16/2024)   Exercise Vital Sign    Days of Exercise per Week: 0 days    Minutes of Exercise per Session: 0 min  Stress: No Stress Concern Present (01/16/2024)   [employer] of Occupational Health - Occupational Stress Questionnaire    Feeling of Stress : Not at all  Social Connections: Socially Isolated (01/16/2024)   Social Connection and Isolation Panel    Frequency of Communication with Friends and Family: Once a week    Frequency of Social Gatherings with Friends and Family: Never  Attends Religious Services: Never    Active Member of Clubs or Organizations: No    Attends Banker Meetings: Never    Marital Status: Divorced     Family History:  The patient's family history includes Alzheimer's disease in her father; Cancer in her brother and maternal grandfather; Heart disease in her maternal grandmother; Heart murmur in her father; Hypertension in her mother; Leukemia in her paternal grandmother. There is no history of Breast cancer.  ROS:   12-point review of systems is negative unless otherwise noted in the HPI.  EKGs/Other Studies Reviewed:    Studies reviewed were summarized above. The additional studies were reviewed today:  10/2023 ABIs Right: Resting right ankle-brachial index is within normal range. The right toe-brachial index is abnormal.  Left: Resting left ankle-brachial index is within normal range. The left toe-brachial index is normal.   06/2023 Coronary CTA 1. Coronary calcium score of 212. This was 85th percentile for age and sex matched control. 2. Normal coronary origin with right dominance. 3. Mild proximal LAD stenosis (25-49%). 4. Minimal proximal LCx stenosis (<25%). 5. CAD-RADS 2. Mild non-obstructive CAD (25-49%). Consider non-atherosclerotic causes  of chest pain. Consider preventive therapy and risk factor modification  EKG:  EKG personally reviewed by me today EKG Interpretation Date/Time:  Monday November 02 2024 08:51:21 EST Ventricular Rate:  64 PR Interval:  166 QRS Duration:  68 QT Interval:  422 QTC Calculation: 435 R Axis:   -6  Text Interpretation: Normal sinus rhythm Possible Inferior infarct , age undetermined When compared with ECG of 16-Feb-2009 11:04, Criteria for Anterior infarct are no longer Present No significant change was found Confirmed by Lorene Sinclair ([id number]) on 11/02/2024 8:53:53 AM  PHYSICAL EXAM:    VS:  BP 120/64 (BP Location: Left Arm, Patient Position: Sitting, Cuff Size: Normal)   Pulse 64   Ht 4' 10 (1.473 m)   Wt 156 lb 12.8 oz (71.1 kg)   SpO2 97%   BMI 32.77 kg/m   BMI: Body mass index is 32.77 kg/m.  GEN: Well nourished, well developed in no acute distress NECK: No JVD; No carotid bruits CARDIAC: RRR, no murmurs, rubs, gallops RESPIRATORY:  Clear to auscultation without rales, wheezing or rhonchi  ABDOMEN: Soft, non-tender, non-distended EXTREMITIES: Trace bilateral LE edema; No deformity  Wt Readings from Last 3 Encounters:  11/02/24 156 lb 12.8 oz (71.1 kg)  10/01/24 160 lb (72.6 kg)  06/01/24 157 lb 6 oz (71.4 kg)        Orthostatic VS for the past 24 hrs (Last 3 readings):  BP- Lying Pulse- Lying BP- Sitting Pulse- Sitting BP- Standing at 0 minutes Pulse- Standing at 0 minutes BP- Standing at 3 minutes Pulse- Standing at 3 minutes  11/02/24 0910 128/82 63 120/78 70 115/74 77 120/75 77           ASSESSMENT & PLAN:   Dyspnea on exertion - Patient reports shortness of breath since her COVID infection last month, worse with exertion.  She has mild intermittent lower extremity swelling.  Recommend obtaining echocardiogram for further evaluation.  Lightheadedness Hypertension - Patient reports lightheadedness with positional changes.  Orthostatics in office today are  negative.  Recommended staying well-hydrated and keeping blood pressure log.  Could consider backing off on antihypertensives based on home readings.  She is continued on valsartan  160 mg daily.  Nonobstructive CAD - Coronary CTA 06/2023 with mild nonobstructive CAD.  Patient reports intermittent atypical chest discomfort.  Obtain echo as above.  She is continued on aspirin  81 mg daily, ezetimibe  10 mg daily, and lovastatin  20 mg 2 times per week.  Hyperlipidemia - Most recent lipid panel 05/2024 with LDL 62, at goal.  Did not tolerate high intensity statins due to myalgias.  She is continued on ezetimibe  and lovastatin  as above.  T2DM - A1c 6.8.  Managed by PCP.    Disposition: Obtain echo.  Keep BP log and consider backing off on valsartan  if pressures are low at home.  F/u with Dr. Gollan or an APP in 3 to 4 months.   Medication Adjustments/Labs and Tests Ordered: Current medicines are reviewed at length with the patient today.  Concerns regarding medicines are outlined above. Medication changes, Labs and Tests ordered today are summarized above and listed in the Patient Instructions accessible in Encounters.   Olivia Lesley Maffucci, PA-C 11/02/2024 9:35 AM     [facility] [place] [address] [place], [place] [address] [phone]

## 2024-11-02 NOTE — Patient Instructions (Signed)
 Medication Instructions:  Your physician recommends that you continue on your current medications as directed. Please refer to the Current Medication list given to you today.  *If you need a refill on your cardiac medications before your next appointment, please call your pharmacy*  Lab Work: No labs ordered today  If you have labs (blood work) drawn today and your tests are completely normal, you will receive your results only by: MyChart Message (if you have MyChart) OR A paper copy in the mail If you have any lab test that is abnormal or we need to change your treatment, we will call you to review the results.  Testing/Procedures: Your physician has requested that you have an echocardiogram. Echocardiography is a painless test that uses sound waves to create images of your heart. It provides your doctor with information about the size and shape of your heart and how well your heart's chambers and valves are working.   You may receive an ultrasound enhancing agent through an IV if needed to better visualize your heart during the echo. This procedure takes approximately one hour.  There are no restrictions for this procedure.  This will take place at [address] [facility] [address] [facility]) [address], [place] [address]  Please note: We ask at that you not bring children with you during ultrasound (echo/ vascular) testing. Due to room size and safety concerns, children are not allowed in the ultrasound rooms during exams. Our front office staff cannot provide observation of children in our lobby area while testing is being conducted. An adult accompanying a patient to their appointment will only be allowed in the ultrasound room at the discretion of the ultrasound technician under special circumstances. We apologize for any inconvenience.   Follow-Up: At [facility], you and your health needs are our priority.  As part of our continuing mission to provide you with exceptional heart  care, our providers are all part of one team.  This team includes your primary Cardiologist (physician) and Advanced Practice Providers or APPs (Physician Assistants and Nurse Practitioners) who all work together to provide you with the care you need, when you need it.  Your next appointment:   3 month(s)  Provider:   You will see one of the following Advanced Practice Providers on your designated Care Team:   Lonni Meager, NP Lesley Maffucci, PA-C Bernardino Bring, PA-C Cadence Franchester, PA-C Tylene Lunch, NP Barnie Hila, NP        Other Instructions Blood pressure log given , please check blood pressure everyday , bring log to your next visit

## 2024-11-12 ENCOUNTER — Other Ambulatory Visit: Payer: Self-pay | Admitting: Family Medicine

## 2024-11-16 NOTE — Telephone Encounter (Signed)
 Per 06/01/24 OV notes, pt was to take 50,000 IU D2 rx weekly x6 months, then return to OTC 2000 IU daily.

## 2024-11-18 ENCOUNTER — Other Ambulatory Visit: Payer: Self-pay | Admitting: Family Medicine

## 2024-11-30 ENCOUNTER — Encounter: Payer: Self-pay | Admitting: Family Medicine

## 2024-11-30 ENCOUNTER — Ambulatory Visit: Admitting: Family Medicine

## 2024-11-30 VITALS — BP 142/90 | HR 70 | Temp 97.7°F | Ht <= 58 in | Wt 159.0 lb

## 2024-11-30 DIAGNOSIS — G8929 Other chronic pain: Secondary | ICD-10-CM | POA: Diagnosis not present

## 2024-11-30 DIAGNOSIS — E1169 Type 2 diabetes mellitus with other specified complication: Secondary | ICD-10-CM | POA: Diagnosis not present

## 2024-11-30 DIAGNOSIS — Z7985 Long-term (current) use of injectable non-insulin antidiabetic drugs: Secondary | ICD-10-CM

## 2024-11-30 DIAGNOSIS — E559 Vitamin D deficiency, unspecified: Secondary | ICD-10-CM | POA: Diagnosis not present

## 2024-11-30 DIAGNOSIS — M47816 Spondylosis without myelopathy or radiculopathy, lumbar region: Secondary | ICD-10-CM

## 2024-11-30 DIAGNOSIS — M25511 Pain in right shoulder: Secondary | ICD-10-CM | POA: Diagnosis not present

## 2024-11-30 DIAGNOSIS — G4700 Insomnia, unspecified: Secondary | ICD-10-CM | POA: Diagnosis not present

## 2024-11-30 DIAGNOSIS — M7989 Other specified soft tissue disorders: Secondary | ICD-10-CM

## 2024-11-30 DIAGNOSIS — Z86718 Personal history of other venous thrombosis and embolism: Secondary | ICD-10-CM

## 2024-11-30 DIAGNOSIS — F4321 Adjustment disorder with depressed mood: Secondary | ICD-10-CM | POA: Diagnosis not present

## 2024-11-30 DIAGNOSIS — I10 Essential (primary) hypertension: Secondary | ICD-10-CM

## 2024-11-30 LAB — POCT GLYCOSYLATED HEMOGLOBIN (HGB A1C): Hemoglobin A1C: 6.3 % — AB (ref 4.0–5.6)

## 2024-11-30 NOTE — Patient Instructions (Addendum)
 Restart vitamin D3 2000 units daily  Try melatonin 5mg  at night as needed for sleep along with gabapentin .  For right shoulder - likely rotator cuff tendonitis/inflammation - take tylenol  for pain, do exercises provided today along with resistance band. Let us  know if not better with this. For left leg - you may have lipedema - try compression stockings , Rx provided today  BP was too high today - continue monitoring BP at home, like salt/sodium in the diet, increase water. Let us  know if consistently >140/90 at home.

## 2024-11-30 NOTE — Assessment & Plan Note (Signed)
 She completed 6 months of 50k weekly replacement. She has not been taking oral replacement - will start 2000 international units OTC daily.

## 2024-11-30 NOTE — Progress Notes (Unsigned)
 Ph: [phone] Fax: [phone]   Patient ID: Olivia Benton, female    DOB: 06/06/1956, 68 y.o.   MRN: [id number]  This visit was conducted in person.  BP (!) 142/90 (BP Location: Left Arm, Cuff Size: Normal)   Pulse 70   Temp 97.7 F (36.5 C)   Ht 4' 9.87 (1.47 m)   Wt 159 lb (72.1 kg)   SpO2 98%   BMI 33.38 kg/m   BP Readings from Last 3 Encounters:  11/30/24 (!) 142/90  11/02/24 120/64  10/01/24 138/76   160/80 on repeat testing CC: 6 mo f/u visit  Subjective:   HPI: Olivia Benton is a 68 y.o. female presenting on 11/30/2024 for Medical Management of Chronic Issues (Left ankle and calf swelling worsening in left month, 6/10 on pain scale,//Right shoulder pain radiates down to elbow, pt unable to lift arm fully, worse at night, onset 1 month//Pt expresses difficulty with moms passing)   Mother passed away 09/18/24. Still grieving mother's passing - this has been difficult. Continues prozac  40mg  daily.   DM - A1c today 6.3%. she continues metformin  and actos  15mg  daily.   HTN - Compliant with current antihypertensive regimen of hydrochlorothiazide  12.5mg  daily, valsartan  160mg  dailyl. Does check blood pressures at home: usually well controlled. She notes she had pizza last night. No low blood pressure readings or symptoms of dizziness/syncope. Denies HA, vision changes, CP/tightness, SOB.    COVID infection 09/2024 treated with paxlovid  - initial exertional dyspnea - that is improved. Ongoing headache. Pending echocardiogram by cardiology.   Chronic L leg swelling (since DVT at age 71yo while on OCP - while recovering from scoliosis surgery), recently worse over the past 1 month. Points to ankle and up posterior calf. Denies inciting trauma /injury or falls. she does get cramps. No recent prolonged periods of immobility.  Venous US  05/2021 - no DVT.   Recurrent R shoulder pain started Fall 2025, did improve initially but notes it has worsened this past month  (attributes to colder weather), worse at night time. Denies inciting trauma/injury or falls. No neck pain. No tingling or numbness down right arm. No left arm pain. She had been taking ibuprofen recently.      Relevant past medical, surgical, family and social history reviewed and updated as indicated. Interim medical history since our last visit reviewed. Allergies and medications reviewed and updated. Outpatient Medications Prior to Visit  Medication Sig Dispense Refill   albuterol  (VENTOLIN  HFA) 108 (90 Base) MCG/ACT inhaler Inhale 2 puffs into the lungs every 4 (four) hours as needed for wheezing or shortness of breath. 8 g 0   aspirin  EC 81 MG tablet Take 1 tablet (81 mg total) by mouth daily. Swallow whole. 90 tablet 3   Blood Glucose Monitoring Suppl (ONE TOUCH ULTRA MINI) w/Device KIT Use as instructed to check blood sugar once daily. 1 kit 0   cyanocobalamin  (VITAMIN B12) 1000 MCG tablet Take 1 tablet (1,000 mcg total) by mouth daily.     esomeprazole  (NEXIUM ) 40 MG capsule TAKE 1 CAPSULE (40 MG TOTAL) BY MOUTH DAILY. 90 capsule 1   ezetimibe  (ZETIA ) 10 MG tablet TAKE 1 TABLET BY MOUTH EVERY DAY 90 tablet 3   FLUoxetine  (PROZAC ) 40 MG capsule Take 1 capsule (40 mg total) by mouth daily. 90 capsule 3   fluticasone  (FLONASE ) 50 MCG/ACT nasal spray Place 2 sprays into both nostrils daily. 48 g 4   gabapentin  (NEURONTIN ) 300 MG capsule Take 1 capsule (300 mg  total) by mouth at bedtime. 90 capsule 3   hydrochlorothiazide  (MICROZIDE ) 12.5 MG capsule Take 1 capsule (12.5 mg total) by mouth daily. 90 capsule 4   levocetirizine (XYZAL) 5 MG tablet Take 5 mg by mouth every evening.     lovastatin  (MEVACOR ) 20 MG tablet TAKE 1 TABLET (20 MG TOTAL) BY MOUTH 2 TIMES A WEEK 26 tablet 4   metFORMIN  (GLUCOPHAGE ) 500 MG tablet Take 1 tablet (500 mg total) by mouth 2 (two) times daily with a meal. 180 tablet 4   montelukast  (SINGULAIR ) 10 MG tablet TAKE 1 TABLET BY MOUTH EVERYDAY AT BEDTIME 90 tablet 4    OneTouch Delica Lancets 33G MISC Use to check sugar once daily. Dx: E11.9 100 each 3   ONETOUCH ULTRA test strip USE AS INSTRUCTED TO CHECK BLOOD SUGAR ONCE A DAY 100 strip 3   pioglitazone  (ACTOS ) 15 MG tablet Take 1 tablet (15 mg total) by mouth daily. 90 tablet 4   tiZANidine  (ZANAFLEX ) 2 MG tablet TAKE 1 TABLET BY MOUTH TWICE A DAY AS NEEDED FOR MUSCLE SPASMS 180 tablet 1   valsartan  (DIOVAN ) 160 MG tablet Take 1 tablet (160 mg total) by mouth daily. 90 tablet 4   Vitamin D , Ergocalciferol , (DRISDOL ) 1.25 MG (50000 UNIT) CAPS capsule Take 1 capsule (50,000 Units total) by mouth every 7 (seven) days. 12 capsule 1   Cholecalciferol (VITAMIN D ) 50 MCG (2000 UT) CAPS Take 1 capsule (2,000 Units total) by mouth daily. (Patient not taking: Reported on 11/30/2024) 30 capsule    Iron , Ferrous Sulfate , 325 (65 Fe) MG TABS Take 325 mg by mouth every other day. (Patient not taking: Reported on 11/30/2024)     Multiple Minerals-Vitamins (CALCIUM & VIT D3 BONE HEALTH PO) Take by mouth daily. (Patient not taking: Reported on 11/30/2024)     No facility-administered medications prior to visit.     Per HPI unless specifically indicated in ROS section below Review of Systems  Objective:  BP (!) 142/90 (BP Location: Left Arm, Cuff Size: Normal)   Pulse 70   Temp 97.7 F (36.5 C)   Ht 4' 9.87 (1.47 m)   Wt 159 lb (72.1 kg)   SpO2 98%   BMI 33.38 kg/m   Wt Readings from Last 3 Encounters:  11/30/24 159 lb (72.1 kg)  11/02/24 156 lb 12.8 oz (71.1 kg)  10/01/24 160 lb (72.6 kg)      Physical Exam    Results for orders placed or performed in visit on 11/30/24  HgB A1c   Collection Time: 11/30/24  2:27 PM  Result Value Ref Range   Hemoglobin A1C 6.3 (A) 4.0 - 5.6 %   HbA1c POC (<> result, manual entry)     HbA1c, POC (prediabetic range)     HbA1c, POC (controlled diabetic range)     ABIs - B ABIs WNL, R TBI abnormal.   Assessment & Plan:   Problem List Items Addressed This Visit      Type 2 diabetes mellitus with other specified complication (HCC) - Primary   Relevant Orders   HgB A1c (Completed)     No orders of the defined types were placed in this encounter.   Orders Placed This Encounter  Procedures   HgB A1c    There are no Patient Instructions on file for this visit.  Follow up plan: No follow-ups on file.  Anton Blas, MD

## 2024-12-01 ENCOUNTER — Encounter: Payer: Self-pay | Admitting: Family Medicine

## 2024-12-01 DIAGNOSIS — G4700 Insomnia, unspecified: Secondary | ICD-10-CM | POA: Insufficient documentation

## 2024-12-01 NOTE — Assessment & Plan Note (Addendum)
 Acute on chronic issue. Suspect RTC injury/tendonitis Provided with exercises from [facility] pt advisor with resistance band, rec tylenol  (avoid NSAIDs in HTN) and update if ongoing for further eval/treatment, consider PT.

## 2024-12-01 NOTE — Assessment & Plan Note (Addendum)
 Chronic issue since DVT at age 68yo.  ?lipedema.  Previously venous US , ABIs reassuring.  Reviewed supportive measures including leg elevation, avoidance of salt, good water intake, and compression stockings. Rx written for thigh high compression 20-30mmHg to take to local DME supply store.

## 2024-12-01 NOTE — Assessment & Plan Note (Signed)
 Chronic, deteriorated.  She attributes to stress and recent pizza consumption last night. Home readings largely well controlled.  No changes today. She will continue monitoring BP at home and let us  know if consistently >140/90 to titrate antihypertensives.

## 2024-12-01 NOTE — Assessment & Plan Note (Signed)
 Mother passed away 08/2024 Support provided.

## 2024-12-01 NOTE — Assessment & Plan Note (Addendum)
 Notes more difficulty with this-  suggested trial melatonin. She already takes gabapentin .

## 2024-12-01 NOTE — Assessment & Plan Note (Signed)
 Chronic, stable period on metformin  and actos . H/o intolerance to Trulicity .

## 2024-12-16 ENCOUNTER — Ambulatory Visit: Attending: Physician Assistant

## 2024-12-16 DIAGNOSIS — R0609 Other forms of dyspnea: Secondary | ICD-10-CM

## 2024-12-16 LAB — ECHOCARDIOGRAM COMPLETE
AR max vel: 1.92 cm2
AV Area VTI: 2.08 cm2
AV Area mean vel: 1.93 cm2
AV Mean grad: 4 mmHg
AV Peak grad: 7.3 mmHg
Ao pk vel: 1.35 m/s
Area-P 1/2: 4.06 cm2
S' Lateral: 2.5 cm

## 2024-12-18 ENCOUNTER — Ambulatory Visit: Payer: Self-pay | Admitting: Physician Assistant

## 2024-12-21 ENCOUNTER — Ambulatory Visit
Admission: RE | Admit: 2024-12-21 | Discharge: 2024-12-21 | Disposition: A | Discharge status: Home / Self Care | Source: Ambulatory Visit | Attending: Family Medicine | Admitting: Family Medicine

## 2024-12-21 DIAGNOSIS — Z1231 Encounter for screening mammogram for malignant neoplasm of breast: Secondary | ICD-10-CM | POA: Insufficient documentation

## 2024-12-23 ENCOUNTER — Ambulatory Visit: Payer: Self-pay | Admitting: Family Medicine

## 2025-01-11 ENCOUNTER — Other Ambulatory Visit: Payer: Self-pay | Admitting: Cardiovascular Disease

## 2025-01-18 ENCOUNTER — Ambulatory Visit: Payer: PPO

## 2025-02-02 ENCOUNTER — Ambulatory Visit: Admitting: Physician Assistant

## 2025-03-19 ENCOUNTER — Ambulatory Visit

## 2025-04-29 ENCOUNTER — Ambulatory Visit

## 2025-05-26 ENCOUNTER — Other Ambulatory Visit

## 2025-06-02 ENCOUNTER — Encounter: Admitting: Family Medicine
